# Patient Record
Sex: Female | Born: 1955
Health system: Southern US, Community
[De-identification: ages and names within clinical notes are randomized; demographics above are authoritative.]

## PROBLEM LIST (undated history)

## (undated) DIAGNOSIS — E785 Hyperlipidemia, unspecified: Secondary | ICD-10-CM

## (undated) DIAGNOSIS — J301 Allergic rhinitis due to pollen: Secondary | ICD-10-CM

## (undated) DIAGNOSIS — E039 Hypothyroidism, unspecified: Secondary | ICD-10-CM

## (undated) DIAGNOSIS — D573 Sickle-cell trait: Secondary | ICD-10-CM

## (undated) DIAGNOSIS — N200 Calculus of kidney: Secondary | ICD-10-CM

## (undated) DIAGNOSIS — E041 Nontoxic single thyroid nodule: Secondary | ICD-10-CM

## (undated) DIAGNOSIS — Z5189 Encounter for other specified aftercare: Secondary | ICD-10-CM

## (undated) DIAGNOSIS — C439 Malignant melanoma of skin, unspecified: Secondary | ICD-10-CM

## (undated) DIAGNOSIS — D571 Sickle-cell disease without crisis: Secondary | ICD-10-CM

## (undated) DIAGNOSIS — T7840XA Allergy, unspecified, initial encounter: Secondary | ICD-10-CM

## (undated) DIAGNOSIS — K52832 Lymphocytic colitis: Secondary | ICD-10-CM

## (undated) DIAGNOSIS — K219 Gastro-esophageal reflux disease without esophagitis: Secondary | ICD-10-CM

## (undated) HISTORY — DX: Hypothyroidism, unspecified: E03.9

## (undated) HISTORY — PX: UPPER GASTROINTESTINAL ENDOSCOPY: SHX188

## (undated) HISTORY — DX: Hyperlipidemia, unspecified: E78.5

## (undated) HISTORY — DX: Malignant melanoma of skin, unspecified: C43.9

## (undated) HISTORY — PX: DILATION AND CURETTAGE OF UTERUS: SHX78

## (undated) HISTORY — DX: Gastro-esophageal reflux disease without esophagitis: K21.9

## (undated) HISTORY — DX: Allergic rhinitis due to pollen: J30.1

## (undated) HISTORY — DX: Sickle-cell trait: D57.3

## (undated) HISTORY — PX: TONSILLECTOMY: SHX5217

## (undated) HISTORY — DX: Allergy, unspecified, initial encounter: T78.40XA

## (undated) HISTORY — DX: Encounter for other specified aftercare: Z51.89

## (undated) HISTORY — PX: BREAST BIOPSY: SHX20

## (undated) HISTORY — DX: Nontoxic single thyroid nodule: E04.1

## (undated) HISTORY — DX: Calculus of kidney: N20.0

## (undated) HISTORY — PX: OTHER SURGICAL HISTORY: SHX169

## (undated) HISTORY — PX: NASAL SINUS SURGERY: SHX719

## (undated) HISTORY — DX: Lymphocytic colitis: K52.832

## (undated) HISTORY — DX: Sickle-cell disease without crisis: D57.1

---

## 1998-01-18 ENCOUNTER — Ambulatory Visit (HOSPITAL_BASED_OUTPATIENT_CLINIC_OR_DEPARTMENT_OTHER): Admission: RE | Admit: 1998-01-18 | Discharge: 1998-01-18 | Payer: Self-pay | Admitting: Orthopedic Surgery

## 1998-01-24 ENCOUNTER — Encounter: Admission: RE | Admit: 1998-01-24 | Discharge: 1998-04-24 | Payer: Self-pay | Admitting: Orthopedic Surgery

## 1998-12-21 ENCOUNTER — Other Ambulatory Visit: Admission: RE | Admit: 1998-12-21 | Discharge: 1998-12-21 | Payer: Self-pay | Admitting: Gynecology

## 1999-04-16 ENCOUNTER — Ambulatory Visit (HOSPITAL_COMMUNITY): Admission: RE | Admit: 1999-04-16 | Discharge: 1999-04-16 | Payer: Self-pay | Admitting: Otolaryngology

## 1999-04-16 ENCOUNTER — Encounter: Payer: Self-pay | Admitting: Otolaryngology

## 1999-07-11 ENCOUNTER — Other Ambulatory Visit: Admission: RE | Admit: 1999-07-11 | Discharge: 1999-07-11 | Payer: Self-pay | Admitting: Gynecology

## 1999-11-16 ENCOUNTER — Other Ambulatory Visit: Admission: RE | Admit: 1999-11-16 | Discharge: 1999-11-16 | Payer: Self-pay | Admitting: Gynecology

## 1999-12-16 ENCOUNTER — Other Ambulatory Visit: Admission: RE | Admit: 1999-12-16 | Discharge: 1999-12-16 | Payer: Self-pay | Admitting: Gynecology

## 1999-12-16 ENCOUNTER — Encounter (INDEPENDENT_AMBULATORY_CARE_PROVIDER_SITE_OTHER): Payer: Self-pay

## 2000-01-30 ENCOUNTER — Ambulatory Visit: Admission: RE | Admit: 2000-01-30 | Discharge: 2000-01-30 | Payer: Self-pay | Admitting: Gynecology

## 2000-04-03 ENCOUNTER — Encounter: Payer: Self-pay | Admitting: Gynecology

## 2000-04-03 ENCOUNTER — Encounter: Admission: RE | Admit: 2000-04-03 | Discharge: 2000-04-03 | Payer: Self-pay | Admitting: Gynecology

## 2000-05-09 ENCOUNTER — Other Ambulatory Visit: Admission: RE | Admit: 2000-05-09 | Discharge: 2000-05-09 | Payer: Self-pay | Admitting: Gynecology

## 2000-05-27 ENCOUNTER — Other Ambulatory Visit: Admission: RE | Admit: 2000-05-27 | Discharge: 2000-05-27 | Payer: Self-pay | Admitting: Gynecology

## 2000-05-27 ENCOUNTER — Encounter (INDEPENDENT_AMBULATORY_CARE_PROVIDER_SITE_OTHER): Payer: Self-pay | Admitting: Specialist

## 2000-11-13 ENCOUNTER — Other Ambulatory Visit: Admission: RE | Admit: 2000-11-13 | Discharge: 2000-11-13 | Payer: Self-pay | Admitting: Gynecology

## 2000-11-13 ENCOUNTER — Encounter (INDEPENDENT_AMBULATORY_CARE_PROVIDER_SITE_OTHER): Payer: Self-pay

## 2000-12-19 ENCOUNTER — Other Ambulatory Visit: Admission: RE | Admit: 2000-12-19 | Discharge: 2000-12-19 | Payer: Self-pay | Admitting: Gynecology

## 2000-12-19 ENCOUNTER — Encounter (INDEPENDENT_AMBULATORY_CARE_PROVIDER_SITE_OTHER): Payer: Self-pay

## 2001-04-10 ENCOUNTER — Encounter: Admission: RE | Admit: 2001-04-10 | Discharge: 2001-04-10 | Payer: Self-pay | Admitting: Gynecology

## 2001-04-10 ENCOUNTER — Encounter: Payer: Self-pay | Admitting: Gynecology

## 2001-05-11 ENCOUNTER — Other Ambulatory Visit: Admission: RE | Admit: 2001-05-11 | Discharge: 2001-05-11 | Payer: Self-pay | Admitting: Gynecology

## 2001-12-10 ENCOUNTER — Other Ambulatory Visit: Admission: RE | Admit: 2001-12-10 | Discharge: 2001-12-10 | Payer: Self-pay | Admitting: Gynecology

## 2002-04-13 ENCOUNTER — Encounter: Admission: RE | Admit: 2002-04-13 | Discharge: 2002-04-13 | Payer: Self-pay | Admitting: Gynecology

## 2002-04-13 ENCOUNTER — Encounter: Payer: Self-pay | Admitting: Gynecology

## 2002-06-01 ENCOUNTER — Other Ambulatory Visit: Admission: RE | Admit: 2002-06-01 | Discharge: 2002-06-01 | Payer: Self-pay | Admitting: Gynecology

## 2003-03-10 ENCOUNTER — Encounter: Payer: Self-pay | Admitting: Gynecology

## 2003-03-10 ENCOUNTER — Encounter: Admission: RE | Admit: 2003-03-10 | Discharge: 2003-03-10 | Payer: Self-pay | Admitting: Gynecology

## 2003-06-06 ENCOUNTER — Other Ambulatory Visit: Admission: RE | Admit: 2003-06-06 | Discharge: 2003-06-06 | Payer: Self-pay | Admitting: Gynecology

## 2003-11-04 ENCOUNTER — Ambulatory Visit (HOSPITAL_COMMUNITY): Admission: RE | Admit: 2003-11-04 | Discharge: 2003-11-04 | Payer: Self-pay | Admitting: Gastroenterology

## 2003-11-04 ENCOUNTER — Encounter: Payer: Self-pay | Admitting: Internal Medicine

## 2004-03-22 ENCOUNTER — Encounter: Admission: RE | Admit: 2004-03-22 | Discharge: 2004-03-22 | Payer: Self-pay | Admitting: Gynecology

## 2004-06-07 ENCOUNTER — Other Ambulatory Visit: Admission: RE | Admit: 2004-06-07 | Discharge: 2004-06-07 | Payer: Self-pay | Admitting: Gynecology

## 2004-06-14 ENCOUNTER — Encounter: Payer: Self-pay | Admitting: Internal Medicine

## 2004-12-26 ENCOUNTER — Encounter: Payer: Self-pay | Admitting: Internal Medicine

## 2004-12-31 ENCOUNTER — Encounter: Admission: RE | Admit: 2004-12-31 | Discharge: 2004-12-31 | Payer: Self-pay | Admitting: Internal Medicine

## 2005-01-03 ENCOUNTER — Encounter: Payer: Self-pay | Admitting: Internal Medicine

## 2005-01-11 ENCOUNTER — Encounter: Admission: RE | Admit: 2005-01-11 | Discharge: 2005-01-11 | Payer: Self-pay | Admitting: Internal Medicine

## 2005-04-02 HISTORY — PX: CHOLECYSTECTOMY: SHX55

## 2005-04-23 ENCOUNTER — Encounter (INDEPENDENT_AMBULATORY_CARE_PROVIDER_SITE_OTHER): Payer: Self-pay | Admitting: *Deleted

## 2005-04-23 ENCOUNTER — Ambulatory Visit (HOSPITAL_COMMUNITY): Admission: RE | Admit: 2005-04-23 | Discharge: 2005-04-23 | Payer: Self-pay | Admitting: Surgery

## 2005-05-14 ENCOUNTER — Encounter: Admission: RE | Admit: 2005-05-14 | Discharge: 2005-05-14 | Payer: Self-pay | Admitting: Gynecology

## 2005-06-18 ENCOUNTER — Encounter: Admission: RE | Admit: 2005-06-18 | Discharge: 2005-06-18 | Payer: Self-pay | Admitting: Internal Medicine

## 2005-07-23 ENCOUNTER — Other Ambulatory Visit: Admission: RE | Admit: 2005-07-23 | Discharge: 2005-07-23 | Payer: Self-pay | Admitting: Gynecology

## 2006-02-05 ENCOUNTER — Encounter: Admission: RE | Admit: 2006-02-05 | Discharge: 2006-02-05 | Payer: Self-pay | Admitting: Internal Medicine

## 2006-03-07 ENCOUNTER — Encounter: Admission: RE | Admit: 2006-03-07 | Discharge: 2006-03-07 | Payer: Self-pay | Admitting: Internal Medicine

## 2006-03-07 ENCOUNTER — Encounter: Payer: Self-pay | Admitting: Internal Medicine

## 2006-05-11 ENCOUNTER — Emergency Department (HOSPITAL_COMMUNITY): Admission: EM | Admit: 2006-05-11 | Discharge: 2006-05-11 | Payer: Self-pay | Admitting: Family Medicine

## 2006-05-19 ENCOUNTER — Other Ambulatory Visit: Admission: RE | Admit: 2006-05-19 | Discharge: 2006-05-19 | Payer: Self-pay | Admitting: Gynecology

## 2006-06-06 ENCOUNTER — Encounter: Admission: RE | Admit: 2006-06-06 | Discharge: 2006-06-06 | Payer: Self-pay | Admitting: Internal Medicine

## 2006-12-15 ENCOUNTER — Other Ambulatory Visit: Admission: RE | Admit: 2006-12-15 | Discharge: 2006-12-15 | Payer: Self-pay | Admitting: Gynecology

## 2006-12-31 ENCOUNTER — Encounter: Admission: RE | Admit: 2006-12-31 | Discharge: 2007-01-14 | Payer: Self-pay | Admitting: Orthopedic Surgery

## 2007-01-16 ENCOUNTER — Encounter: Payer: Self-pay | Admitting: Internal Medicine

## 2007-01-20 ENCOUNTER — Encounter: Payer: Self-pay | Admitting: Internal Medicine

## 2007-01-29 ENCOUNTER — Emergency Department (HOSPITAL_COMMUNITY): Admission: EM | Admit: 2007-01-29 | Discharge: 2007-01-29 | Payer: Self-pay | Admitting: Family Medicine

## 2007-01-30 ENCOUNTER — Encounter: Payer: Self-pay | Admitting: Internal Medicine

## 2007-02-06 ENCOUNTER — Encounter: Payer: Self-pay | Admitting: Internal Medicine

## 2007-02-06 ENCOUNTER — Encounter: Admission: RE | Admit: 2007-02-06 | Discharge: 2007-02-06 | Payer: Self-pay | Admitting: Internal Medicine

## 2007-05-27 ENCOUNTER — Encounter: Payer: Self-pay | Admitting: Internal Medicine

## 2007-06-11 ENCOUNTER — Encounter: Admission: RE | Admit: 2007-06-11 | Discharge: 2007-06-11 | Payer: Self-pay | Admitting: Internal Medicine

## 2007-08-03 ENCOUNTER — Other Ambulatory Visit: Admission: RE | Admit: 2007-08-03 | Discharge: 2007-08-03 | Payer: Self-pay | Admitting: Gynecology

## 2008-02-22 ENCOUNTER — Other Ambulatory Visit: Admission: RE | Admit: 2008-02-22 | Discharge: 2008-02-22 | Payer: Self-pay | Admitting: Gynecology

## 2008-05-04 ENCOUNTER — Encounter: Payer: Self-pay | Admitting: Internal Medicine

## 2008-06-13 ENCOUNTER — Encounter: Admission: RE | Admit: 2008-06-13 | Discharge: 2008-06-13 | Payer: Self-pay | Admitting: Gynecology

## 2008-09-02 DIAGNOSIS — C439 Malignant melanoma of skin, unspecified: Secondary | ICD-10-CM

## 2008-09-02 HISTORY — PX: OTHER SURGICAL HISTORY: SHX169

## 2008-09-02 HISTORY — DX: Malignant melanoma of skin, unspecified: C43.9

## 2008-12-21 ENCOUNTER — Ambulatory Visit: Payer: Self-pay | Admitting: Internal Medicine

## 2008-12-21 DIAGNOSIS — D573 Sickle-cell trait: Secondary | ICD-10-CM | POA: Insufficient documentation

## 2008-12-21 DIAGNOSIS — Z87448 Personal history of other diseases of urinary system: Secondary | ICD-10-CM | POA: Insufficient documentation

## 2008-12-21 DIAGNOSIS — J301 Allergic rhinitis due to pollen: Secondary | ICD-10-CM

## 2008-12-21 DIAGNOSIS — J45909 Unspecified asthma, uncomplicated: Secondary | ICD-10-CM | POA: Insufficient documentation

## 2008-12-21 DIAGNOSIS — E039 Hypothyroidism, unspecified: Secondary | ICD-10-CM | POA: Insufficient documentation

## 2008-12-21 DIAGNOSIS — E041 Nontoxic single thyroid nodule: Secondary | ICD-10-CM | POA: Insufficient documentation

## 2008-12-21 DIAGNOSIS — J309 Allergic rhinitis, unspecified: Secondary | ICD-10-CM | POA: Insufficient documentation

## 2008-12-22 ENCOUNTER — Ambulatory Visit: Payer: Self-pay | Admitting: Internal Medicine

## 2008-12-23 ENCOUNTER — Telehealth: Payer: Self-pay | Admitting: Internal Medicine

## 2008-12-26 LAB — CONVERTED CEMR LAB
Bilirubin Urine: NEGATIVE
CO2: 26 meq/L (ref 19–32)
Calcium: 9.4 mg/dL (ref 8.4–10.5)
Cholesterol: 197 mg/dL (ref 0–200)
Creatinine, Ser: 0.6 mg/dL (ref 0.4–1.2)
Eosinophils Relative: 4.1 % (ref 0.0–5.0)
Glucose, Bld: 103 mg/dL — ABNORMAL HIGH (ref 70–99)
HCT: 41.9 % (ref 36.0–46.0)
HDL: 45.3 mg/dL (ref >39.00)
Hemoglobin: 14 g/dL (ref 12.0–15.0)
Leukocytes, UA: NEGATIVE
Lymphocytes Relative: 23.5 % (ref 12.0–46.0)
MCHC: 33.5 g/dL (ref 30.0–36.0)
Neutro Abs: 3.3 10*3/uL (ref 1.4–7.7)
Neutrophils Relative %: 62.9 % (ref 43.0–77.0)
Nitrite: NEGATIVE
Potassium: 4 meq/L (ref 3.5–5.1)
RDW: 12.1 % (ref 11.5–14.6)
TSH: 1.36 microintl units/mL (ref 0.35–5.50)
Total CHOL/HDL Ratio: 4
Urine Glucose: NEGATIVE mg/dL
Urobilinogen, UA: 0.2 (ref 0.0–1.0)
WBC: 5.1 10*3/uL (ref 4.5–10.5)
pH: 5.5 (ref 5.0–8.0)

## 2008-12-28 ENCOUNTER — Telehealth: Payer: Self-pay | Admitting: Internal Medicine

## 2009-06-09 ENCOUNTER — Ambulatory Visit: Payer: Self-pay | Admitting: Internal Medicine

## 2009-06-09 DIAGNOSIS — R141 Gas pain: Secondary | ICD-10-CM

## 2009-06-09 DIAGNOSIS — E785 Hyperlipidemia, unspecified: Secondary | ICD-10-CM | POA: Insufficient documentation

## 2009-06-09 DIAGNOSIS — R143 Flatulence: Secondary | ICD-10-CM

## 2009-06-09 DIAGNOSIS — R142 Eructation: Secondary | ICD-10-CM

## 2009-06-19 ENCOUNTER — Encounter: Admission: RE | Admit: 2009-06-19 | Discharge: 2009-06-19 | Payer: Self-pay | Admitting: Gynecology

## 2009-08-01 ENCOUNTER — Telehealth: Payer: Self-pay | Admitting: Internal Medicine

## 2009-08-01 ENCOUNTER — Ambulatory Visit: Payer: Self-pay | Admitting: Internal Medicine

## 2009-08-01 LAB — CONVERTED CEMR LAB
Direct LDL: 154.4 mg/dL
HDL: 47.1 mg/dL (ref >39.00)
Total CHOL/HDL Ratio: 5
VLDL: 16 mg/dL (ref 0.0–40.0)

## 2009-08-30 ENCOUNTER — Ambulatory Visit: Payer: Self-pay | Admitting: Internal Medicine

## 2009-11-20 ENCOUNTER — Ambulatory Visit: Payer: Self-pay | Admitting: Internal Medicine

## 2009-11-20 LAB — CONVERTED CEMR LAB
AST: 15 units/L (ref 0–37)
Basophils Relative: 1 % (ref 0.0–3.0)
Bilirubin Urine: NEGATIVE
Bilirubin, Direct: 0.2 mg/dL (ref 0.0–0.3)
Cholesterol: 113 mg/dL (ref 0–200)
Creatinine, Ser: 0.6 mg/dL (ref 0.4–1.2)
Eosinophils Relative: 3.1 % (ref 0.0–5.0)
GFR calc non Af Amer: 109.86 mL/min (ref >60)
HDL: 46.3 mg/dL (ref >39.00)
Hemoglobin: 13.8 g/dL (ref 12.0–15.0)
Lymphs Abs: 1.2 10*3/uL (ref 0.7–4.0)
MCHC: 33.4 g/dL (ref 30.0–36.0)
MCV: 88.4 fL (ref 78.0–100.0)
Monocytes Relative: 8.3 % (ref 3.0–12.0)
Neutro Abs: 3.5 10*3/uL (ref 1.4–7.7)
Potassium: 4.1 meq/L (ref 3.5–5.1)
Sodium: 142 meq/L (ref 135–145)
Specific Gravity, Urine: 1.015 (ref 1.000–1.030)
TSH: 0.52 microintl units/mL (ref 0.35–5.50)
Total CHOL/HDL Ratio: 2
Total Protein: 6.7 g/dL (ref 6.0–8.3)
Triglycerides: 64 mg/dL (ref 0.0–149.0)
Urine Glucose: NEGATIVE mg/dL
Urobilinogen, UA: 0.2 (ref 0.0–1.0)
VLDL: 12.8 mg/dL (ref 0.0–40.0)
WBC: 5.5 10*3/uL (ref 4.5–10.5)
pH: 5 (ref 5.0–8.0)

## 2009-11-22 ENCOUNTER — Ambulatory Visit: Payer: Self-pay | Admitting: Internal Medicine

## 2009-12-11 ENCOUNTER — Ambulatory Visit: Payer: Self-pay | Admitting: Internal Medicine

## 2009-12-11 DIAGNOSIS — IMO0001 Reserved for inherently not codable concepts without codable children: Secondary | ICD-10-CM

## 2009-12-11 LAB — CONVERTED CEMR LAB: Total CK: 77 units/L (ref 7–177)

## 2010-05-15 ENCOUNTER — Encounter (INDEPENDENT_AMBULATORY_CARE_PROVIDER_SITE_OTHER): Payer: Self-pay | Admitting: *Deleted

## 2010-05-15 ENCOUNTER — Ambulatory Visit: Payer: Self-pay | Admitting: Internal Medicine

## 2010-05-15 DIAGNOSIS — K219 Gastro-esophageal reflux disease without esophagitis: Secondary | ICD-10-CM

## 2010-05-15 DIAGNOSIS — R0602 Shortness of breath: Secondary | ICD-10-CM

## 2010-05-18 ENCOUNTER — Ambulatory Visit: Payer: Self-pay | Admitting: Internal Medicine

## 2010-05-19 LAB — CONVERTED CEMR LAB
Cholesterol: 193 mg/dL (ref 0–200)
HDL: 52.1 mg/dL (ref >39.00)
LDL Cholesterol: 129 mg/dL — ABNORMAL HIGH (ref 0–99)
Total CHOL/HDL Ratio: 4

## 2010-06-08 ENCOUNTER — Telehealth: Payer: Self-pay | Admitting: Internal Medicine

## 2010-06-27 ENCOUNTER — Ambulatory Visit: Payer: Self-pay | Admitting: Gastroenterology

## 2010-06-27 ENCOUNTER — Ambulatory Visit: Payer: Self-pay | Admitting: Internal Medicine

## 2010-06-27 DIAGNOSIS — J019 Acute sinusitis, unspecified: Secondary | ICD-10-CM | POA: Insufficient documentation

## 2010-07-12 ENCOUNTER — Ambulatory Visit: Payer: Self-pay | Admitting: Gastroenterology

## 2010-07-12 HISTORY — PX: ESOPHAGOGASTRODUODENOSCOPY: SHX1529

## 2010-08-07 ENCOUNTER — Telehealth: Payer: Self-pay | Admitting: Internal Medicine

## 2010-09-22 ENCOUNTER — Encounter: Payer: Self-pay | Admitting: Internal Medicine

## 2010-09-23 ENCOUNTER — Encounter: Payer: Self-pay | Admitting: Gynecology

## 2010-09-30 LAB — CONVERTED CEMR LAB: Pap Smear: NEGATIVE

## 2010-10-02 ENCOUNTER — Ambulatory Visit
Admission: RE | Admit: 2010-10-02 | Discharge: 2010-10-02 | Payer: Self-pay | Source: Home / Self Care | Attending: Internal Medicine | Admitting: Internal Medicine

## 2010-10-04 NOTE — Letter (Signed)
Summary: EGD Instructions  [facility]  [address] [place], [place] [address]   Phone: [phone]  Fax: [phone]       Lindsey Ferrell    04/06/1956    MRN: [id number]       Procedure Day /Date:THURSDAY 07/12/2010     Arrival Time: 1PM     Procedure Time:2PM     Location of Procedure:                    X  [facility] (4th Floor)  PREPARATION FOR ENDOSCOPY   On11/10 THE DAY OF THE PROCEDURE:  1.   No solid foods, milk or milk products are allowed after midnight the night before your procedure.  2.   Do not drink anything colored red or purple.  Avoid juices with pulp.  No orange juice.  3.  You may drink clear liquids until12PM, which is 2 hours before your procedure.                                                                                                CLEAR LIQUIDS INCLUDE: Water Jello Ice Popsicles Tea (sugar ok, no milk/cream) Powdered fruit flavored drinks Coffee (sugar ok, no milk/cream) Gatorade Juice: apple, white grape, white cranberry  Lemonade Clear bullion, consomm, broth Carbonated beverages (any kind) Strained chicken noodle soup Hard Candy   MEDICATION INSTRUCTIONS  Unless otherwise instructed, you should take regular prescription medications with a small sip of water as early as possible the morning of your procedure.            OTHER INSTRUCTIONS  You will need a responsible adult at least 57 years of age to accompany you and drive you home.   This person must remain in the waiting room during your procedure.  Wear loose fitting clothing that is easily removed.  Leave jewelry and other valuables at home.  However, you may wish to bring a book to read or an iPod/MP3 player to listen to music as you wait for your procedure to start.  Remove all body piercing jewelry and leave at home.  Total time from sign-in until discharge is approximately 2-3 hours.  You should go home directly after your  procedure and rest.  You can resume normal activities the day after your procedure.  The day of your procedure you should not:   Drive   Make legal decisions   Operate machinery   Drink alcohol   Return to work  You will receive specific instructions about eating, activities and medications before you leave.    The above instructions have been reviewed and explained to me by   _______________________    I fully understand and can verbalize these instructions _____________________________ Date _________

## 2010-10-04 NOTE — Procedures (Signed)
Summary: Upper Endoscopy  Patient: Lindsey Ferrell Note: All result statuses are Final unless otherwise noted.  Tests: (1) Upper Endoscopy (EGD)   EGD Upper Endoscopy       DONE     [facility]     [address]     [place], [place]  [address]           ENDOSCOPY PROCEDURE REPORT           PATIENT:  Lindsey Ferrell, Lindsey Ferrell  MR#:  [id number]     BIRTHDATE:  1956/04/14, 54 yrs. old  GENDER:  female           ENDOSCOPIST:  Barbette Hair. Arlyce Dice, MD     Referred by:  Rene Paci, M.D.           PROCEDURE DATE:  07/12/2010     PROCEDURE:  EGD, diagnostic 43235     ASA CLASS:  Class I     INDICATIONS:  reflux symptoms despite therapy           MEDICATIONS:   Fentanyl 50 mcg IV, Versed 5 mg IV, glycopyrrolate     (Robinal) 0.2 mg IV, 0.6cc simethancone 0.6 cc PO     TOPICAL ANESTHETIC:  Exactacain Spray           DESCRIPTION OF PROCEDURE:   After the risks benefits and     alternatives of the procedure were thoroughly explained, informed     consent was obtained.  The LB GIF-H180 [id number] endoscope was     introduced through the mouth and advanced to the third portion of     the duodenum, without limitations.  The instrument was slowly     withdrawn as the mucosa was fully examined.     <<PROCEDUREIMAGES>>           The upper, middle, and distal third of the esophagus were     carefully inspected and no abnormalities were noted. The z-line     was well seen at the GEJ. The endoscope was pushed into the fundus     which was normal including a retroflexed view. The antrum,gastric     body, first and second part of the duodenum were unremarkable (see     image1 and image2).    Retroflexed views revealed no     abnormalities.    The scope was then withdrawn from the patient     and the procedure completed.           COMPLICATIONS:  None           ENDOSCOPIC IMPRESSION:     1) Normal EGD     RECOMMENDATIONS:     1) continue PPI - zegerid     2) Call office next 2-3 days to  schedule an office appointment     for           REPEAT EXAM:  No           ______________________________     Barbette Hair. Arlyce Dice, MD           CC:           n.     eSIGNED:   Barbette Hair. Kaplan at 07/12/2010 02:29 PM           Molinda Bailiff, [phone]  Note: An exclamation mark (!) indicates a result that was not dispersed into the flowsheet. Document Creation Date: 07/12/2010 2:29 PM _______________________________________________________________________  (1) Order result status: Final Collection or  observation date-time: 07/12/2010 14:14 Requested date-time:  Receipt date-time:  Reported date-time:  Referring Physician:   Ordering Physician: Melvia Heaps [phone]) Specimen Source:  Source: Launa Grill Order Number: [id number] Lab site:

## 2010-10-04 NOTE — Assessment & Plan Note (Signed)
Summary: cpx / #/ cd   Vital Signs:  Patient profile:   55 year old female Height:      65 inches (165.10 cm) Weight:      151.6 pounds (68.91 kg) BMI:     25.32 O2 Sat:      97 % on Room air Temp:     97.1 degrees F (36.17 degrees C) oral Pulse rate:   65 / minute BP sitting:   98 / 72  (left arm) Cuff size:   regular  Vitals Entered By: Orlan Leavens (November 22, 2009 9:52 AM)  O2 Flow:  Room air CC: CPX Is Patient Diabetic? No Pain Assessment Patient in pain? no        Primary Care Provider:  Newt Lukes MD  CC:  CPX.  History of Present Illness: patient is here today for annual physical. Patient feels well and has no complaints.    Preventive Screening-Counseling & Management  Alcohol-Tobacco     Alcohol drinks/day: <1     Alcohol Counseling: not indicated; use of alcohol is not excessive or problematic     Smoking Status: never     Tobacco Counseling: not indicated; no tobacco use  Caffeine-Diet-Exercise     Does Patient Exercise: yes     Type of exercise: walk     Exercise (avg: min/session): <30     Times/week: 7     Exercise Counseling: to improve exercise regimen  Safety-Violence-Falls     Seat Belt Use: yes     Helmet Use: n/a     Firearms in the Home: no firearms in the home     Smoke Detectors: yes     Violence in the Home: no risk noted     Fall Risk Counseling: not indicated; no significant falls noted  Clinical Review Panels:  Prevention   Last Mammogram:  No specific mammographic evidence of malignancy.   (04/02/2009)   Last Pap Smear:  Interpretation/Result:Negative for intraepithelial Lesion or Malignancy.    (12/01/2008)   Last Colonoscopy:  normal (11/04/2003)  Immunizations   Last Flu Vaccine:  Fluvax 3+ (06/09/2009)  Lipid Management   Cholesterol:  113 (11/20/2009)   LDL (bad choesterol):  54 (11/20/2009)   HDL (good cholesterol):  46.30 (11/20/2009)  CBC   WBC:  5.5 (11/20/2009)   RBC:  4.66 (11/20/2009)   Hgb:   13.8 (11/20/2009)   Hct:  41.2 (11/20/2009)   Platelets:  178.0 (11/20/2009)   MCV  88.4 (11/20/2009)   MCHC  33.4 (11/20/2009)   RDW  12.3 (11/20/2009)   PMN:  65.3 (11/20/2009)   Lymphs:  22.3 (11/20/2009)   Monos:  8.3 (11/20/2009)   Eosinophils:  3.1 (11/20/2009)   Basophil:  1.0 (11/20/2009)  Complete Metabolic Panel   Glucose:  91 (11/20/2009)   Sodium:  142 (11/20/2009)   Potassium:  4.1 (11/20/2009)   Chloride:  109 (11/20/2009)   CO2:  30 (11/20/2009)   BUN:  15 (11/20/2009)   Creatinine:  0.6 (11/20/2009)   Albumin:  3.9 (11/20/2009)   Total Protein:  6.7 (11/20/2009)   Calcium:  9.5 (11/20/2009)   Total Bili:  0.9 (11/20/2009)   Alk Phos:  77 (11/20/2009)   SGPT (ALT):  18 (11/20/2009)   SGOT (AST):  15 (11/20/2009)   Current Medications (verified): 1)  Levothyroxine Sodium 137 Mcg Tabs (Levothyroxine Sodium) .... Take 1 By Mouth Qd 2)  Aspir-Low 81 Mg Tbec (Aspirin) .... Take 1 By Mouth Qd 3)  Crestor 5  Mg Tabs (Rosuvastatin Calcium) .... Take 1 At Bedtime 4)  Vitamin D 1000 Unit Tabs (Cholecalciferol) .... Once Daily  Allergies (verified): No Known Drug Allergies  Past History:  Past medical, surgical, family and social histories (including risk factors) reviewed, and no changes noted (except as noted below).  Past Medical History: Allergic rhinitis Asthma hypothyroidism dyslipidemia  melanoma, right thigh  MD rooster: gyn - [facility] - [place], [place]  Past Surgical History: Cholecystectomy (2007) Tonsillectomy (child0 Sinus surgery (1999) melanoma in situ excision right ant thigh -(2010)  Family History: Reviewed history from 06/09/2009 and no changes required. Family History of Arthritis (parents) Family History Diabetes 1st degree relative (Maternal uncle) Family History Hypertension (parents & grandparents) Family History of Stroke F 1st degree relative <60 (parents & grandparents) Heart disease (parents & grandparents) Sickle  trait  father is 17 - sudden MI summer 2010 Mother died of either stroke or heart attack at age 57 sister dx breast cancer 76yo  Social History: Reviewed history from 12/21/2008 and no changes required. Never Smoked married occasional alcohol 2 grown daughters with grandchildren + 2 adopted children, living at home.  Seat Belt Use:  yes  Review of Systems       see HPI above. I have reviewed all other systems and they were negative.   Physical Exam  General:  alert, well-developed, well-nourished, and cooperative to examination.   Head:  Normocephalic and atraumatic without obvious abnormalities. No apparent alopecia or balding. Eyes:  vision grossly intact; pupils equal, round and reactive to light.  conjunctiva and lids normal.    Ears:  normal pinnae bilaterally, without erythema, swelling, or tenderness to palpation. TMs hazy but no effusion, no cerumen impaction. Hearing grossly normal bilaterally  Mouth:  teeth and gums in good repair; mucous membranes moist, without lesions or ulcers. oropharynx clear without exudate, no erythema.  Neck:  supple, full ROM, no masses, no thyromegaly; no thyroid nodules or tenderness. no JVD or carotid bruits.   Lungs:  normal respiratory effort, no intercostal retractions or use of accessory muscles; normal breath sounds bilaterally - no crackles and no wheezes.    Heart:  normal rate, regular rhythm, no murmur, and no rub. BLE without edema. normal DP pulses and normal cap refill in all 4 extremities    Abdomen:  soft, non-tender, normal bowel sounds, no distention; no masses and no appreciable hepatomegaly or splenomegaly.   Msk:  No deformity or scoliosis noted of thoracic or lumbar spine.   Neurologic:  alert & oriented X3 and cranial nerves II-XII symetrically intact.  strength normal in all extremities, sensation intact to light touch, and gait normal. speech fluent without dysarthria or aphasia; follows commands with good comprehension.    Skin:  no rashes, vesicles, ulcers, or erythema. No nodules or irregularity to palpation.  well healed wide excsion scar on right anterior thigh Psych:  Oriented X3, memory intact for recent and remote, normally interactive, good eye contact, not anxious appearing, not depressed appearing, and not agitated.      Impression & Recommendations:  Problem # 1:  PHYSICAL EXAMINATION (ICD-V70.0) Patient has been counseled on age-appropriate routine health concerns for screening and prevention. These are reviewed and up-to-date. Immunizations are up-to-date or declined. Labs and ECG reviewed.  Orders: EKG w/ Interpretation (93000)  Complete Medication List: 1)  Levothyroxine Sodium 137 Mcg Tabs (Levothyroxine sodium) .... Take 1 by mouth qd 2)  Aspir-low 81 Mg Tbec (Aspirin) .... Take 1 by mouth qd 3)  Crestor  5 Mg Tabs (Rosuvastatin calcium) .... Take 1 at bedtime 4)  Vitamin D 1000 Unit Tabs (Cholecalciferol) .... Once daily  Patient Instructions: 1)  it was good to see you today.  2)  labs and exam look great! keep up the good work -  3)  refills on medications as discussed - no changes 4)  try Aleve, Advil or Tylenol (and/or massage) for your low back symptoms - if this is not helping or if pain gets worse, let us know for further evaluation as needed  5)  followup as ongoing with dermatology and gynecology 6)  Please schedule a follow-up appointment in 6 months for review of meds and labs (cholesterol, liver & thryroid), call sooner if problems.  Prescriptions: CRESTOR 5 MG TABS (ROSUVASTATIN CALCIUM) take 1 at bedtime  #30 x 11   Entered by:   Orlan Leavens   Authorized by:   Newt Lukes MD   Signed by:   Orlan Leavens on 11/22/2009   Method used:   Electronically to        [facility] [address] [phone]* (retail)       [address]       [place], [place]  [address]       Ph: [phone] or [phone]       Fax: [phone]   RxID:    [id number] LEVOTHYROXINE SODIUM 137 MCG TABS (LEVOTHYROXINE SODIUM) take 1 by mouth qd  #30 x 11   Entered by:   Orlan Leavens   Authorized by:   Newt Lukes MD   Signed by:   Orlan Leavens on 11/22/2009   Method used:   Electronically to        [facility] [address] [phone]* (retail)       [address]       [place], [place]  [address]       Ph: [phone] or [phone]       Fax: [phone]   RxID:   [id number]    Pap Smear  Procedure date:  12/01/2008  Findings:      Interpretation/Result:Negative for intraepithelial Lesion or Malignancy.     Mammogram  Procedure date:  04/02/2009  Findings:      No specific mammographic evidence of malignancy.

## 2010-10-04 NOTE — Assessment & Plan Note (Signed)
Summary: ?sinus inf/cd   Vital Signs:  Patient profile:   55 year old female Height:      65 inches (165.10 cm) Weight:      152 pounds (69.09 kg) O2 Sat:      97 % on Room air Temp:     98.1 degrees F (36.72 degrees C) oral Pulse rate:   75 / minute BP sitting:   102 / 82  (left arm) Cuff size:   regular  Vitals Entered By: Orlan Leavens RMA (June 27, 2010 9:52 AM)  O2 Flow:  Room air CC: ? Sinus infection Is Patient Diabetic? No Pain Assessment Patient in pain? no        Primary Care Provider:  Newt Lukes MD  CC:  ? Sinus infection.  History of Present Illness: c/o sinus problems onset 2 weeks ago assoc with left frontal sinus pressure improved but resolved with nasonex and decongestant use low garde fever -  +purulent nasal draiange and productive cough symptoms worse at night (when lying flat) symptoms precipitated by weather change  reviewed chronic med issues dyslipidemia- stopped crestor 12/2009 due to myalgias resumed med tx 06/2010 - doing well w/o mysalgia symptoms  reports prev with nonsp elevation of CPK approx 3 years ago - verified with repeat labs, then normalized  hypothyroid - reports compliance with ongoing medical treatment and no changes in medication dose or frequency. denies adverse side effects related to current therapy. no skin/hair change, +weight gain  ?GERD - see discussion above with dyspnea - no regular NSAID use, no hx ulcers   Clinical Review Panels:  Lipid Management   Cholesterol:  193 (05/18/2010)   LDL (bad choesterol):  129 (05/18/2010)   HDL (good cholesterol):  52.10 (05/18/2010)  CBC   WBC:  5.5 (11/20/2009)   RBC:  4.66 (11/20/2009)   Hgb:  13.8 (11/20/2009)   Hct:  41.2 (11/20/2009)   Platelets:  178.0 (11/20/2009)   MCV  88.4 (11/20/2009)   MCHC  33.4 (11/20/2009)   RDW  12.3 (11/20/2009)   PMN:  65.3 (11/20/2009)   Lymphs:  22.3 (11/20/2009)   Monos:  8.3 (11/20/2009)   Eosinophils:  3.1  (11/20/2009)   Basophil:  1.0 (11/20/2009)   Current Medications (verified): 1)  Levothyroxine Sodium 137 Mcg Tabs (Levothyroxine Sodium) .... Take 1 By Mouth Qd 2)  Vitamin D 1000 Unit Tabs (Cholecalciferol) .... Once Daily As Needed 3)  Crestor 5 Mg Tabs (Rosuvastatin Calcium) .Marland Kitchen.. 1 Tab By Mouth At Bedtime 4)  Zegerid 40-1100 Mg Caps (Omeprazole-Sodium Bicarbonate) .... Take One Tab At That Time  Allergies (verified): No Known Drug Allergies  Past History:  Past Medical History: Allergic rhinitis Asthma hypothyroidism dyslipidemia   melanoma, right thigh GERD  MD roster: gyn - [facility] - [place], [facility] - [facility]  Review of Systems  The patient denies hoarseness, chest pain, headaches, and hemoptysis.    Physical Exam  General:  alert, well-developed, well-nourished, and cooperative to examination.   Eyes:  vision grossly intact; pupils equal, round and reactive to light.  conjunctiva and lids normal.    Ears:  hazy L TM Nose:  mild frontal sinus tenderness to palp on left side Mouth:  teeth and gums in good repair; mucous membranes moist, without lesions or ulcers. oropharynx clear without exudate, min erythema. +PND Lungs:  normal respiratory effort, no intercostal retractions or use of accessory muscles; normal breath sounds bilaterally - no crackles and no wheezes.    Heart:  normal rate, regular rhythm, no murmur, and no rub. BLE without edema.   Impression & Recommendations:  Problem # 1:  SINUSITIS, ACUTE (ICD-461.9)  Her updated medication list for this problem includes:    Nasonex 50 Mcg/act Susp (Mometasone furoate) .Marland Kitchen... 1 spray every nostril once daily    Amoxicillin-pot Clavulanate 875-125 Mg Tabs (Amoxicillin-pot clavulanate) .Marland Kitchen... 1 by mouth two times a day x 7 days  Instructed on treatment. Call if symptoms persist or worsen.   Orders: Prescription Created Electronically [phone])  Problem # 2:  GERD (ICD-530.81)  Her updated medication  list for this problem includes:    Zegerid 40-1100 Mg Caps (Omeprazole-sodium bicarbonate) .Marland Kitchen... Take one tab at that time per GI OV today: Symptoms of chest tightness may certainly be related to GERD.  She appears to have had a partial response to omeprazole active peptic disease is less likely.  H. pylori infection and Barrett's should be ruled out.  She seems to primarily have nocturnal symptoms.  Recommendations #1 switch from omeprazole to Zegerid 40 mg q.h.s. #2 upper endoscopy  Complete Medication List: 1)  Levothyroxine Sodium 137 Mcg Tabs (Levothyroxine sodium) .... Take 1 by mouth qd 2)  Vitamin D 1000 Unit Tabs (Cholecalciferol) .... Once daily as needed 3)  Crestor 5 Mg Tabs (Rosuvastatin calcium) .Marland Kitchen.. 1 tab by mouth at bedtime 4)  Zegerid 40-1100 Mg Caps (Omeprazole-sodium bicarbonate) .... Take one tab at that time 5)  Nasonex 50 Mcg/act Susp (Mometasone furoate) .Marland Kitchen.. 1 spray every nostril once daily 6)  Amoxicillin-pot Clavulanate 875-125 Mg Tabs (Amoxicillin-pot clavulanate) .Marland Kitchen.. 1 by mouth two times a day x 7 days  Patient Instructions: 1)  it was good to see you today. 2)  augmentin antibiotics and nasonex - your prescriptions have been electronically submitted to your pharmacy. Please take as directed. Contact our office if you believe you're having problems with the medication(s).  3)  Get plenty of rest, drink lots of clear liquids, and use Tylenol or Ibuprofen for fever and comfort. Return in 7-10 days if you're not better:sooner if you're feeling worse. Prescriptions: AMOXICILLIN-POT CLAVULANATE 875-125 MG TABS (AMOXICILLIN-POT CLAVULANATE) 1 by mouth two times a day x 7 days  #14 x 0   Entered and Authorized by:   Newt Lukes MD   Signed by:   Newt Lukes MD on 06/27/2010   Method used:   Electronically to        [facility] [address] [phone]* (retail)       [address]       [place], [place]  [address]       Ph: [phone] or  [phone]       Fax: [phone]   RxID:   [id number] NASONEX 50 MCG/ACT SUSP (MOMETASONE FUROATE) 1 spray every nostril once daily  #1 x 3   Entered and Authorized by:   Newt Lukes MD   Signed by:   Newt Lukes MD on 06/27/2010   Method used:   Electronically to        [facility] [address] [phone]* (retail)       [address]       [place], [place]  [address]       Ph: [phone] or [phone]       Fax: [phone]   RxID:   [id number]    Orders Added: 1)  Est. Patient Level IV [[address]] 2)  Prescription Created Electronically [phone]

## 2010-10-04 NOTE — Letter (Signed)
Summary: [facility] Consult Scheduled Letter  [facility]  [address] [place], [place] [address]   Phone: [phone]  Fax: [phone]      05/15/2010 MRN: [id number]  [facility] [address] [place], [place]  [address]    Dear Ms. Empson,      We have scheduled an appointment for you.  At the recommendation of Dr.Leschber, we have scheduled you a consult with Dr Arlyce Dice on 06/27/10 at 8:30am.  Their phone number is [phone].  If this appointment day and time is not convenient for you, please feel free to call the office of the doctor you are being referred to at the number listed above and reschedule the appointment.    [facility] [address] [place], [place] [address] *Gastroenterology Dept.3rd Floor*   Please give 24hr notice if you need to cancel/reschedule to avoid a $50.00 fee.Also bring insurance card and any co-pay due at time of visit.    Thank you,  Patient Care Coordinator [facility]

## 2010-10-04 NOTE — Assessment & Plan Note (Signed)
Summary: gerd...as.   History of Present Illness Visit Type: consult  Primary GI MD: Melvia Heaps MD [facility] Primary Kenyon Eichelberger: Newt Lukes MD Requesting Wildon Cuevas: Newt Lukes MD Chief Complaint: GERD, upper abd pain, tightness in chest and bloating  History of Present Illness:   [name]  is a 55 year old white female referred at the request of Dr.  Felicity Coyer for evaluation of upper abdominal discomfort and chest tightness.  Over the past year she has had the sensation of wanting to take a deep breath.  She denies dyspnea on exertion or resting shortness of breath.  She has also noted some chest tightness, especially lying in the recumbent position.  She denies pyrosis, per se.  Symptoms have improved with omeprazole though they remain to some degree.  She  had a cardiac workup in the past that was negative.  She is on no gastric irritants including nonsteroidals.  She has noted a minor change in bowel habits over the past year characterized by intermittent loose stools alternating with solid bowel movements.  Colonoscopy 2005 for screening was unremarkable.  She has also noted an area of firmness in her epigastrium when she lays down that is minimally tender to palpation.   GI Review of Systems    Reports abdominal pain, acid reflux, bloating, and  heartburn.     Location of  Abdominal pain: upper abdomen.    Denies belching, chest pain, dysphagia with liquids, dysphagia with solids, loss of appetite, nausea, vomiting, vomiting blood, weight loss, and  weight gain.      Reports change in bowel habits.     Denies anal fissure, black tarry stools, constipation, diarrhea, diverticulosis, heme positive stool, hemorrhoids, irritable bowel syndrome, jaundice, light color stool, liver problems, rectal bleeding, and  rectal pain.    Current Medications (verified): 1)  Levothyroxine Sodium 137 Mcg Tabs (Levothyroxine Sodium) .... Take 1 By Mouth Qd 2)  Vitamin D 1000 Unit Tabs  (Cholecalciferol) .... Once Daily As Needed 3)  Omeprazole 20 Mg Cpdr (Omeprazole) .Marland Kitchen.. 1 By Mouth Once Daily 4)  Crestor 5 Mg Tabs (Rosuvastatin Calcium) .Marland Kitchen.. 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 Cholecystectomy (2007) Tonsillectomy (child0 Sinus surgery (1999) melanoma in situ excision right ant thigh -(2010)  Family History: Family History of Arthritis (parents) Family History Diabetes 1st degree relative (Maternal uncle) Family History Hypertension (parents & grandparents) Family History of Stroke F 1st degree relative <60 (parents & grandparents) Heart disease (parents & grandparents) Sickle trait father is 78 - sudden MI summer 2010 Mother died of either stroke or heart attack at age 62 sister dx breast cancer 25yo No FH of Colon Cancer:  Social History: Engineer, structural Married occasional alcohol 2 grown daughters with grandchildren + 2 adopted children, living at home. Patient has never smoked.  Daily Caffeine Use: one daily  Review of Systems       The patient  complains of allergy/sinus, cough, and voice change.  The patient denies anemia, anxiety-new, arthritis/joint pain, back pain, blood in urine, breast changes/lumps, change in vision, confusion, coughing up blood, depression-new, fainting, fatigue, fever, headaches-new, hearing problems, heart murmur, heart rhythm changes, itching, menstrual pain, muscle pains/cramps, night sweats, nosebleeds, pregnancy symptoms, shortness of breath, skin rash, sleeping problems, sore throat, swelling of feet/legs, swollen lymph glands, thirst - excessive , urination - excessive , urination changes/pain, urine leakage, and vision changes.         All other systems were reviewed and were negative   Vital Signs:  Patient profile:   55 year old female Height:      65 inches Weight:      156 pounds BMI:     26.05 BSA:     1.78 Pulse rate:   68 / minute Pulse rhythm:   regular BP sitting:   110 / 60  (left arm) Cuff size:   regular  Vitals Entered By: Ok Anis CMA (June 27, 2010 8:39 AM)  Physical Exam  Additional Exam:  On physical exam she is a well-developed well-nourished female  skin: anicteric HEENT: normocephalic; PEERLA; no nasal or pharyngeal abnormalities neck: supple nodes: no cervical lymphadenopathy chest: clear to ausculatation and percussion heart: no murmurs, gallops, or rubs abd: soft, nontender; BS normoactive; no abdominal masses, tenderness, organomegaly rectal: deferred ext: no cynanosis, clubbing, edema skeletal: no deformities neuro: oriented x 3; no focal abnormalities    Impression & Recommendations:  Problem # 1:  GERD (ICD-530.81)  Symptoms of chest tightness may certainly be related to GERD.  She appears to have had a partial response to omeprazole.   Active peptic disease is less likely.  H. pylori infection and Barrett's should be ruled out.  She seems to primarily have nocturnal symptoms.  Recommendations #1 switch from omeprazole to Zegerid 40 mg q.h.s. #2  upper endoscopy  Orders: EGD (EGD)  Problem # 2:  HYPERTENSION (ICD-401.9) Assessment: Comment Only  Patient Instructions: 1)  Copy sent to : Newt Lukes MD 2)  Conscious Sedation brochure given.  3)  Upper Endoscopy brochure given.  4)  Your EGD is scheduled on 07/12/2010 at 2pm 5)  You can pick up your rx from your pharmacy today 6)  The medication list was reviewed and reconciled.  All changed / newly prescribed medications were explained.  A complete medication list was provided to the patient / caregiver. Prescriptions: ZEGERID 40-1100 MG CAPS (OMEPRAZOLE-SODIUM BICARBONATE) take one tab at that time  #30 x 2   Entered and Authorized by:   Louis Meckel MD   Signed by:   Louis Meckel MD on 06/27/2010   Method used:   Electronically to        [facility] [address] [phone]* (retail)       [address]       [place], [place]  [address]       Ph: [phone] or [phone]       Fax: [phone]   RxID:   [id number]

## 2010-10-04 NOTE — Assessment & Plan Note (Signed)
Summary: MUSCLE PROBLEM/NWS   Vital Signs:  Patient profile:   55 year old female Height:      65 inches (165.10 cm) Weight:      151.6 pounds (68.91 kg) O2 Sat:      96 % on Room air Temp:     98.1 degrees F (36.72 degrees C) oral Pulse rate:   66 / minute BP sitting:   118 / 80  (left arm) Cuff size:   regular  Vitals Entered By: Orlan Leavens (December 11, 2009 1:09 PM)  O2 Flow:  Room air CC: Muscle problem Is Patient Diabetic? No Pain Assessment Patient in pain? yes     Location: whole body Type: aching   Primary Care Provider:  Newt Lukes MD  CC:  Muscle problem.  History of Present Illness: complains of muscle tenderness everywhere - tried massage - but too tender diffusely to be relaxed - pain seemed just low back initially, now acrodd torso, ribs, legs, arms and stomach - stopped crestor last PM after reading online about symptoms - reports prev with nonsp elevation of CPK approx 3 years ago - verified with repeat labs, then normalized    Current Medications (verified): 1)  Levothyroxine Sodium 137 Mcg Tabs (Levothyroxine Sodium) .... Take 1 By Mouth Qd 2)  Aspir-Low 81 Mg Tbec (Aspirin) .... Take 1 By Mouth Qd 3)  Crestor 5 Mg Tabs (Rosuvastatin Calcium) .... Take 1 At Bedtime 4)  Vitamin D 1000 Unit Tabs (Cholecalciferol) .... Once Daily  Allergies (verified): No Known Drug Allergies  Past History:  Past Medical History: Allergic rhinitis Asthma hypothyroidism dyslipidemia  melanoma, right thigh  MD rooster: gyn - [facility] - [place], [facility]  Review of Systems  The patient denies fever, peripheral edema, headaches, and muscle weakness.    Physical Exam  General:  alert, well-developed, well-nourished, and cooperative to examination.   Msk:  No deformity or scoliosis noted of thoracic or lumbar spine.  tender diffusely to mod-deep palp efforts -  Neurologic:  alert & oriented X3 and cranial nerves II-XII symetrically intact.   strength normal in all extremities, sensation intact to light touch, and gait normal. speech fluent without dysarthria or aphasia; follows commands with good comprehension.    Impression & Recommendations:  Problem # 1:  MYALGIA (ICD-729.1) ?med SE - stop statin - check CK total level now -  hydrate if elev CK, recheck in next few das to weeks off statin -  ?need rheum eval given hx same if remains abn off statin Her updated medication list for this problem includes:    Aspir-low 81 Mg Tbec (Aspirin) .Marland Kitchen... Take 1 by mouth qd  Orders: TLB-CK Total Only(Creatine Kinase/CPK) (82550-CK)  Problem # 2:  HYPERLIPIDEMIA, BORDERLINE (ICD-272.4) stop statin due to muscleskel adv SE - see evals above consider alt tx as needed  The following medications were removed from the medication list:    Crestor 5 Mg Tabs (Rosuvastatin calcium) .Marland Kitchen... Take 1 at bedtime  Labs Reviewed: SGOT: 15 (11/20/2009)   SGPT: 18 (11/20/2009)   HDL:46.30 (11/20/2009), 47.10 (08/01/2009)  LDL:54 (11/20/2009), 130 (78/46/9629)  Chol:113 (11/20/2009), 217 (08/01/2009)  Trig:64.0 (11/20/2009), 80.0 (08/01/2009)  Problem # 3:  other Time spent with patient 25 minutes, more than 50% of this time was spent counseling patient on myalgias and myolysis and problems concerning the need for eval and tx of same including cessation of statin tx  Complete Medication List: 1)  Levothyroxine Sodium 137 Mcg Tabs (Levothyroxine sodium) .Marland KitchenMarland KitchenMarland Kitchen  Take 1 by mouth qd 2)  Aspir-low 81 Mg Tbec (Aspirin) .... Take 1 by mouth qd 3)  Vitamin D 1000 Unit Tabs (Cholecalciferol) .... Once daily  Patient Instructions: 1)  it was good to see you today.  2)  test(s) ordered today - your results will be called you once reviewed. 3)  Drink as much fluid as you can tolerate for the next few days. 4)  stop crestor - will avoid statins in future for cholesterol treatment (if any needed)

## 2010-10-04 NOTE — Progress Notes (Signed)
Summary: ABX refill?  Phone Note Call from Patient Call back at [facility] Phone [phone] Call back at Work Phone [phone]   Caller: Patient Summary of Call: Pt called stating she is still having sinus infection syptoms. Pt is requesting refill of ABX or stronger ABX, please advise. Initial call taken by: Margaret Pyle, CMA,  August 07, 2010 1:26 PM  Follow-up for Phone Call        ok - levaquin once daily x 10d, erx done - call for ov if still unimproved, sooner if worse - thanks Follow-up by: Newt Lukes MD,  August 07, 2010 4:35 PM  Additional Follow-up for Phone Call Additional follow up Details #1::        Pt advised Additional Follow-up by: Margaret Pyle, CMA,  August 07, 2010 4:38 PM    New/Updated Medications: LEVAQUIN 500 MG TABS (LEVOFLOXACIN) 1 by mouth once daily x 10d Prescriptions: LEVAQUIN 500 MG TABS (LEVOFLOXACIN) 1 by mouth once daily x 10d  #10 x 0   Entered and Authorized by:   Newt Lukes MD   Signed by:   Newt Lukes MD on 08/07/2010   Method used:   Electronically to        [facility] [address] [phone]* (retail)       [address]       [place], [place]  [address]       Ph: [phone] or [phone]       Fax: [phone]   RxID:   [id number]

## 2010-10-04 NOTE — Assessment & Plan Note (Signed)
Summary: NOT FEELING WELL/CD   Vital Signs:  Patient profile:   55 year old female Height:      65 inches (165.10 cm) Weight:      152.0 pounds (69.09 kg) O2 Sat:      97 % on Room air Temp:     97.6 degrees F (36.44 degrees C) oral Pulse rate:   65 / minute BP sitting:   110 / 78  (left arm) Cuff size:   regular  Vitals Entered By: Orlan Leavens RMA (May 15, 2010 9:06 AM)  O2 Flow:  Room air CC: Not feeling well Is Patient Diabetic? No Pain Assessment Patient in pain? no        Primary Care Provider:  Newt Lukes MD  CC:  Not feeling well.  History of Present Illness: c/o dyspnea  constant but worse with progression of day -  hx same - cardiac w/u 2008 for same unremarkable (at [facility] with dr. gates) a/w tight feeling in chest and neck - "hard to take a deep breath" - feels abd bloating keeping her from breathing normally - a/w excess flatulence/gas no cough or pleurisy or burning reflux - no CP or edema - no change in meds vauge epigastric pain - no n/v or changein bowels variable relief of symptoms with maalox -  not improved with dc of ASA use 01/2010 no fever, night sweats or weight loss, some weight gain no anemia per eval with gyn last week  dyslipidemia- stopped crestor 12/2009 due to myalgias, now improved/resolved reports prev with nonsp elevation of CPK approx 3 years ago - verified with repeat labs, then normalized would like lipids recheck now   hypothyroid - reports compliance with ongoing medical treatment and no changes in medication dose or frequency. denies adverse side effects related to current therapy. no skin/hair change, +weight gain  ?GERD - see discussion above with dyspnea - no regular NSAID use, no hx ulcers   Clinical Review Panels:  Prevention   Last Mammogram:  No specific mammographic evidence of malignancy.   (04/02/2009)   Last Pap Smear:  Interpretation/Result:Negative for intraepithelial Lesion or Malignancy.  (12/01/2008)   Last Colonoscopy:  normal (11/04/2003)  Lipid Management   Cholesterol:  113 (11/20/2009)   LDL (bad choesterol):  54 (11/20/2009)   HDL (good cholesterol):  46.30 (11/20/2009)  CBC   WBC:  5.5 (11/20/2009)   RBC:  4.66 (11/20/2009)   Hgb:  13.8 (11/20/2009)   Hct:  41.2 (11/20/2009)   Platelets:  178.0 (11/20/2009)   MCV  88.4 (11/20/2009)   MCHC  33.4 (11/20/2009)   RDW  12.3 (11/20/2009)   PMN:  65.3 (11/20/2009)   Lymphs:  22.3 (11/20/2009)   Monos:  8.3 (11/20/2009)   Eosinophils:  3.1 (11/20/2009)   Basophil:  1.0 (11/20/2009)  Complete Metabolic Panel   Glucose:  91 (11/20/2009)   Sodium:  142 (11/20/2009)   Potassium:  4.1 (11/20/2009)   Chloride:  109 (11/20/2009)   CO2:  30 (11/20/2009)   BUN:  15 (11/20/2009)   Creatinine:  0.6 (11/20/2009)   Albumin:  3.9 (11/20/2009)   Total Protein:  6.7 (11/20/2009)   Calcium:  9.5 (11/20/2009)   Total Bili:  0.9 (11/20/2009)   Alk Phos:  77 (11/20/2009)   SGPT (ALT):  18 (11/20/2009)   SGOT (AST):  15 (11/20/2009)   Current Medications (verified): 1)  Levothyroxine Sodium 137 Mcg Tabs (Levothyroxine Sodium) .... Take 1 By Mouth Qd 2)  Vitamin D 1000 Unit  Tabs (Cholecalciferol) .... Once Daily As Needed  Allergies (verified): No Known Drug Allergies  Past History:  Past Medical History: Allergic rhinitis Asthma hypothyroidism dyslipidemia  melanoma, right thigh  MD roster: gyn - [facility] - [place], [facility] - prev weisman,  to be [facility]  Review of Systems  The patient denies hoarseness, chest pain, syncope, peripheral edema, headaches, and severe indigestion/heartburn.    Physical Exam  General:  alert, well-developed, well-nourished, and cooperative to examination.   Eyes:  vision grossly intact; pupils equal, round and reactive to light.  conjunctiva and lids normal.    Ears:  normal pinnae bilaterally, without erythema, swelling, or tenderness to palpation. TMs clear, no effusion,  no cerumen impaction. Hearing grossly normal bilaterally  Mouth:  teeth and gums in good repair; mucous membranes moist, without lesions or ulcers. oropharynx clear without exudate, no erythema.  Lungs:  normal respiratory effort, no intercostal retractions or use of accessory muscles; normal breath sounds bilaterally - no crackles and no wheezes.    Heart:  normal rate, regular rhythm, no murmur, and no rub. BLE without edema. Abdomen:  soft, non-tender, normal bowel sounds, no distention; no masses and no appreciable hepatomegaly or splenomegaly.   Psych:  Oriented X3, memory intact for recent and remote, normally interactive, good eye contact, not anxious appearing, not depressed appearing, and not agitated.      Impression & Recommendations:  Problem # 1:  DYSPNEA (ICD-786.05) ?GI symptom of untx mild reflux - pulm exam clear, not exertional and O2 normal - prior cardiac eval for same symptoms in 2008 unremarkable - will start PPI and refer to GI given constellation of other GI symptoms (bloating/gas, epigastric pain) - pt prefers [facility] group  Problem # 2:  GERD (ICD-530.81) see above d/w dyspnea Her updated medication list for this problem includes:    Omeprazole 20 Mg Cpdr (Omeprazole) .Marland Kitchen... 1 by mouth once daily  Orders: Prescription Created Electronically [phone]) Gastroenterology Referral (GI)  Problem # 3:  UNSPECIFIED HYPOTHYROIDISM (ICD-244.9)  Her updated medication list for this problem includes:    Levothyroxine Sodium 137 Mcg Tabs (Levothyroxine sodium) .Marland Kitchen... Take 1 by mouth qd  Orders: TLB-TSH (Thyroid Stimulating Hormone) (84443-TSH)  check labs when returns for fasting lipids prior labs reviewd - plan to cont same dosing pending labs  Labs Reviewed: TSH: 0.52 (11/20/2009)    Chol: 113 (11/20/2009)   HDL: 46.30 (11/20/2009)   LDL: 54 (11/20/2009)   TG: 64.0 (11/20/2009)  Problem # 4:  HYPERLIPIDEMIA, BORDERLINE (ICD-272.4)  Orders: TLB-Lipid Panel  (80061-LIPID)  stop statin 12/2009 due to muscleskel adv SE -  consider alt tx as needed  recheck FLP  Labs Reviewed: SGOT: 15 (11/20/2009)   SGPT: 18 (11/20/2009)   HDL:46.30 (11/20/2009), 47.10 (08/01/2009)  LDL:54 (11/20/2009), 130 (08/65/7846)  Chol:113 (11/20/2009), 217 (08/01/2009)  Trig:64.0 (11/20/2009), 80.0 (08/01/2009)  Complete Medication List: 1)  Levothyroxine Sodium 137 Mcg Tabs (Levothyroxine sodium) .... Take 1 by mouth qd 2)  Vitamin D 1000 Unit Tabs (Cholecalciferol) .... Once daily as needed 3)  Omeprazole 20 Mg Cpdr (Omeprazole) .Marland Kitchen.. 1 by mouth once daily  Patient Instructions: 1)  it was good to see you today. 2)  start omeprazole for probable reflux symptoms - your prescriptions have been electronically submitted to your pharmacy. Please take as directed. Contact our office if you believe you're having problems with the medication(s).  3)  we'll make referral to [facility] for further evaluation as discussed. Our office will contact you regarding this  appointment once made.  4)  test(s) ordered today - thyroid and cholesterol - return in AM when fasting for lab only - your results will then be posted on the phone tree for review in 48-72 hours from the time of test completion; call [phone] and enter your 9 digit MRN (listed above on this page, just below your name); if any changes need to be made or there are abnormal results, you will be contacted directly.  Prescriptions: OMEPRAZOLE 20 MG CPDR (OMEPRAZOLE) 1 by mouth once daily  #30 x 2   Entered and Authorized by:   Newt Lukes MD   Signed by:   Newt Lukes MD on 05/15/2010   Method used:   Electronically to        [facility] [address] [phone]* (retail)       [address]       [place], [place]  [address]       Ph: [phone] or [phone]       Fax: [phone]   RxID:   [id number]

## 2010-10-04 NOTE — Progress Notes (Signed)
Summary: Chol med req  Phone Note Call from Patient Call back at Work Phone [phone]   Summary of Call: Pt called stating that there has been an elevation of her Chol since last lipid panel in March 2011. Pt says she watches her diet and exercises but she has a family Hx of CVA and MI. Pt is requesting to go back to taking Crestor or similar medicine to help control chol. Please advise? Crestor D/Cd due to myalgias 12/2009 Initial call taken by: Margaret Pyle, CMA,  June 08, 2010 1:08 PM  Follow-up for Phone Call        ok to resume crestor 5mg  at bedtime or livalo 1mg  at bedtime or lipitor 10mg  at bedtime - i feel comfortable with any of these medications for her cholesterol tx - pt may choose and then erx as needed - will followup labs at next OV - thanks! Follow-up by: Newt Lukes MD,  June 08, 2010 1:18 PM  Additional Follow-up for Phone Call Additional follow up Details #1::        left message on machine for pt to return my call. Margaret Pyle, CMA  June 08, 2010 1:23 PM  Pt advised and chose to try Crestor because she had used it before. Rx sent to pharmacy per pt req. Additional Follow-up by: Margaret Pyle, CMA,  June 08, 2010 3:23 PM    New/Updated Medications: CRESTOR 5 MG TABS (ROSUVASTATIN CALCIUM) 1 tab by mouth at bedtime Prescriptions: CRESTOR 5 MG TABS (ROSUVASTATIN CALCIUM) 1 tab by mouth at bedtime  #30 x 2   Entered by:   Margaret Pyle, CMA   Authorized by:   Newt Lukes MD   Signed by:   Margaret Pyle, CMA on 06/08/2010   Method used:   Electronically to        [facility] [address] [phone]* (retail)       [address]       [place], [place]  [address]       Ph: [phone] or [phone]       Fax: [phone]   RxID:   [id number]

## 2010-10-04 NOTE — Letter (Signed)
Summary: New Patient letter  [facility]  [address] [place], [place] [address]   Phone: [phone]  Fax: [phone]       05/15/2010 MRN: [id number]  [facility] [address] [place], [place]  [address]  Dear Ms. Gram,  Welcome to the Gastroenterology Division at [facility].    You are scheduled to see Dr. Arlyce Dice on 06/27/2010 at 8:30AM on the 3rd floor at [facility], [address].  We ask that you try to arrive at our office 15 minutes prior to your appointment time to allow for check-in.  We would like you to complete the enclosed self-administered evaluation form prior to your visit and bring it with you on the day of your appointment.  We will review it with you.  Also, please bring a complete list of all your medications or, if you prefer, bring the medication bottles and we will list them.  Please bring your insurance card so that we may make a copy of it.  If your insurance requires a referral to see a specialist, please bring your referral form from your primary care physician.  Co-payments are due at the time of your visit and may be paid by cash, check or credit card.     Your office visit will consist of a consult with your physician (includes a physical exam), any laboratory testing he/she may order, scheduling of any necessary diagnostic testing (e.g. x-ray, ultrasound, CT-scan), and scheduling of a procedure (e.g. Endoscopy, Colonoscopy) if required.  Please allow enough time on your schedule to allow for any/all of these possibilities.    If you cannot keep your appointment, please call [phone] to cancel or reschedule prior to your appointment date.  This allows [name] the opportunity to schedule an appointment for another patient in need of care.  If you do not cancel or reschedule by 5 p.m. the business day prior to your appointment date, you will be charged a $50.00 late cancellation/no-show fee.    Thank you for  choosing [facility] for your medical needs.  We appreciate the opportunity to care for you.  Please visit [name] at our website  to learn more about our practice.                     Sincerely,                                                             The Gastroenterology Division

## 2010-10-04 NOTE — Letter (Signed)
Summary: Results Letter  [facility]  [address] [place], [place] [address]   Phone: [phone]  Fax: [phone]        June 27, 2010 MRN: [id number]    [facility] [address] [place], [place]  [address]    Dear Lindsey Ferrell,  It is my pleasure to have treated you recently as a new patient in my office. I appreciate your confidence and the opportunity to participate in your care.  Since I do have a busy inpatient endoscopy schedule and office schedule, my office hours vary weekly. I am, however, available for emergency calls everyday through my office. If I am not available for an urgent office appointment, another one of our gastroenterologist will be able to assist you.  My well-trained staff are prepared to help you at all times. For emergencies after office hours, a physician from our Gastroenterology section is always available through my 24 hour answering service  Once again I welcome you as a new patient and I look forward to a happy and healthy relationship             Sincerely,  Louis Meckel MD  This letter has been electronically signed by your physician.  Appended Document: Results Letter letter mailed

## 2010-10-08 ENCOUNTER — Encounter: Payer: Self-pay | Admitting: Gastroenterology

## 2010-10-08 ENCOUNTER — Ambulatory Visit (INDEPENDENT_AMBULATORY_CARE_PROVIDER_SITE_OTHER): Payer: Commercial Managed Care - PPO | Admitting: Gastroenterology

## 2010-10-08 DIAGNOSIS — K219 Gastro-esophageal reflux disease without esophagitis: Secondary | ICD-10-CM

## 2010-10-10 NOTE — Assessment & Plan Note (Signed)
Summary: ?sinus infection/val/lb   Vital Signs:  Patient profile:   55 year old female Height:      65 inches Weight:      163.25 pounds BMI:     27.26 O2 Sat:      96 % on Room air Temp:     98.4 degrees F oral Pulse rate:   83 / minute Pulse rhythm:   regular BP sitting:   106 / 72  (left arm) Cuff size:   regular  Vitals Entered By: Rock Nephew CMA (October 02, 2010 1:56 PM)  O2 Flow:  Room air CC: Patient c/o headache, sore throat, runny nose, and R ear pressure Is Patient Diabetic? No  Does patient need assistance? Functional Status Self care Ambulation Normal   Primary Care Provider:  Newt Lukes MD  CC:  Patient c/o headache, sore throat, runny nose, and and R ear pressure.  History of Present Illness: Lindsey Ferrell presents with a two week h/o sinus infection type symptoms. She has had pressure and headache in the frontal and maxillary sinus area; feels tight in the area of the ears. she has had low grade fever. She has been using naxonex, mucinex, hot tea, contact. She has purulent sinus drainage with bitter metallic gtaste. She says this has been going around the family.   Allergies: No Known Drug Allergies  Past History:  Past Medical History: Last updated: 06/27/2010 Allergic rhinitis Asthma hypothyroidism dyslipidemia   melanoma, right thigh GERD  MD roster: gyn - mezier derm - [place], [facility] - [facility]plan  Past Surgical History: Last updated: 11/22/2009 Cholecystectomy (2007) Tonsillectomy (child0 Sinus surgery (1999) melanoma in situ excision right ant thigh -(2010) FH reviewed for relevance, SH/Risk Factors reviewed for relevance  Review of Systems       The patient complains of fever and decreased hearing.  The patient denies anorexia, weight loss, vision loss, hoarseness, syncope, dyspnea on exertion, prolonged cough, headaches, abdominal pain, severe indigestion/heartburn, muscle weakness, difficulty walking, depression,  abnormal bleeding, and angioedema.    Physical Exam  General:  alert, well-developed, well-nourished, well-hydrated, and healthy-appearing.   Head:  no tenderness to percussion over the frontal and maxillary sinuses Eyes:  pupils equal and pupils round.  C&S clear Ears:  EACs, TMs normal in appearance. Poor movement of TMs with insuffulation. Nose:  no external deformity, no external erythema, and no nasal discharge.   Mouth:  posterior pharynx with mild degree of cobblestone appearance, no frank exudate Neck:  supple.   Lungs:  normal respiratory effort and normal breath sounds.   Heart:  normal rate and regular rhythm.   Skin:  turgor normal and color normal.   Cervical Nodes:  no anterior cervical adenopathy and no posterior cervical adenopathy.   Psych:  normally interactive, good eye contact, and not anxious appearing.     Impression & Recommendations:  Problem # 1:  SINUSITIS, ACUTE (ICD-461.9) Persistent sinus symptoms with unremarkable exam. Definite eustachian tube dysfunction.  Plan - Amox 875mg  two times a day x 7           continue supportive care.   Her updated medication list for this problem includes:    Nasonex 50 Mcg/act Susp (Mometasone furoate) .Marland Kitchen... 1 spray every nostril once daily    Amoxicillin 875 Mg Tabs (Amoxicillin) .Marland Kitchen... 1 by mouth two times a day x 7 for uri  Complete Medication List: 1)  Levothyroxine Sodium 137 Mcg Tabs (Levothyroxine sodium) .... Take 1 by mouth qd 2)  Vitamin D 1000 Unit Tabs (Cholecalciferol) .... Once daily as needed 3)  Crestor 5 Mg Tabs (Rosuvastatin calcium) .Marland Kitchen.. 1 tab by mouth at bedtime 4)  Zegerid 40-1100 Mg Caps (Omeprazole-sodium bicarbonate) .... Take one tab at that time 5)  Nasonex 50 Mcg/act Susp (Mometasone furoate) .Marland Kitchen.. 1 spray every nostril once daily 6)  Amoxicillin 875 Mg Tabs (Amoxicillin) .Marland Kitchen.. 1 by mouth two times a day x 7 for uri  Patient Instructions: 1)  Upper respiratory infection with eustachian tube  dysfunction - plan : amoxicillin 875mg  two times a day x 7 days; sudafed 30mg  two times a day or three times a day and cough syrup of choice with DM (dxtromethorophan). fljuids, vitamin C 1500mg  daily, ecchinacea if you believe.  Prescriptions: AMOXICILLIN 875 MG TABS (AMOXICILLIN) 1 by mouth two times a day x 7 for URI  #14 x 0   Entered and Authorized by:   Jacques Navy MD   Signed by:   Jacques Navy MD on 10/02/2010   Method used:   Electronically to        [facility] [address] [phone]* (retail)       [address]       [place], [place]  [address]       Ph: [phone] or [phone]       Fax: [phone]   RxID:   [id number]    Orders Added: 1)  Est. Patient Level III [[address]]

## 2010-10-18 NOTE — Assessment & Plan Note (Signed)
Summary: F/u from procedure    History of Present Illness Visit Type: Follow-up Visit Primary GI MD: Melvia Heaps MD [facility] Primary Provider: Newt Lukes MD Requesting Provider: na Chief Complaint: F/u from EGD. Pt denies any GI complaints  History of Present Illness:   Lindsey Ferrell has returned for followup of her GERD.  She is now off medications and is feeling well except for occasional minimal discomfort in the midepigastrium.  Endoscopy in November, 2011 was normal.   GI Review of Systems      Denies abdominal pain, acid reflux, belching, bloating, chest pain, dysphagia with liquids, dysphagia with solids, heartburn, loss of appetite, nausea, vomiting, vomiting blood, weight loss, and  weight gain.        Denies anal fissure, black tarry stools, change in bowel habit, constipation, diarrhea, diverticulosis, fecal incontinence, heme positive stool, hemorrhoids, irritable bowel syndrome, jaundice, light color stool, liver problems, rectal bleeding, and  rectal pain.    Current Medications (verified): 1)  Levothyroxine Sodium 137 Mcg Tabs (Levothyroxine Sodium) .... Take 1 By Mouth Qd 2)  Vitamin D 1000 Unit Tabs (Cholecalciferol) .... Once Daily As Needed 3)  Nasonex 50 Mcg/act Susp (Mometasone Furoate) .Marland Kitchen.. 1 Spray Every Nostril Once Daily 4)  Amoxicillin 875 Mg Tabs (Amoxicillin) .Marland Kitchen.. 1 By Mouth Two Times A Day (Two Left)  Allergies (verified): No Known Drug Allergies  Past History:  Past Medical History: Reviewed history from 06/27/2010 and no changes required. Allergic rhinitis Asthma hypothyroidism dyslipidemia   melanoma, right thigh GERD  MD roster: gyn - mezier derm - [place], [facility] - [place] Leeth  Past Surgical History: Reviewed history from 11/22/2009 and no changes required. Cholecystectomy (2007) Tonsillectomy (child0 Sinus surgery (1999) melanoma in situ excision right ant thigh -(2010)  Family History: Reviewed history from 06/27/2010 and  no changes required. Family History of Arthritis (parents) Family History Diabetes 1st degree relative (Maternal uncle) Family History Hypertension (parents & grandparents) Family History of Stroke F 1st degree relative <60 (parents & grandparents) Heart disease (parents & grandparents) Sickle trait father is 25 - sudden MI summer 2010 Mother died of either stroke or heart attack at age 64 sister dx breast cancer 37yo No FH of Colon Cancer:  Social History: Reviewed history from 06/27/2010 and no changes required. [employer] Married occasional alcohol 2 grown daughters with grandchildren + 2 adopted children, living at home. Patient has never smoked.  Daily Caffeine Use: one daily   Review of Systems       The patient complains of allergy/sinus and cough.  The patient denies anemia, anxiety-new, arthritis/joint pain, back pain, blood in urine, breast changes/lumps, change in vision, confusion, coughing up blood, depression-new, fainting, fatigue, fever, headaches-new, hearing problems, heart murmur, heart rhythm changes, itching, menstrual pain, muscle pains/cramps, night sweats, nosebleeds, pregnancy symptoms, shortness of breath, skin rash, sleeping problems, sore throat, swelling of feet/legs, swollen lymph glands, thirst - excessive , urination - excessive , urination changes/pain, urine leakage, vision changes, and voice change.         All other systems were reviewed and were negative   Vital Signs:  Patient profile:   55 year old female Height:      65 inches Weight:      161 pounds BMI:     26.89 BSA:     1.81 Pulse rate:   68 / minute Pulse rhythm:   regular BP sitting:   122 / 74  (left arm) Cuff size:   regular  Vitals  Entered By: Ok Anis CMA (October 08, 2010 9:14 AM)  Physical Exam  Additional Exam:  Physical Exam She Is a Well-Developed Well-Nourished Female     Impression & Recommendations:  Problem # 1:  GERD (ICD-530.81) She is   asymptomatic off medicines.  She will use PPI therapy or antacids as needed.  Patient Instructions: 1)  Copy sent to : Newt Lukes MD 2)  Follow up as needed  3)  The medication list was reviewed and reconciled.  All changed / newly prescribed medications were explained.  A complete medication list was provided to the patient / caregiver.

## 2010-12-31 ENCOUNTER — Encounter: Payer: Self-pay | Admitting: Internal Medicine

## 2011-01-01 ENCOUNTER — Ambulatory Visit (INDEPENDENT_AMBULATORY_CARE_PROVIDER_SITE_OTHER): Payer: 59 | Admitting: Internal Medicine

## 2011-01-01 ENCOUNTER — Encounter: Payer: Self-pay | Admitting: Internal Medicine

## 2011-01-01 DIAGNOSIS — R5381 Other malaise: Secondary | ICD-10-CM

## 2011-01-01 DIAGNOSIS — E039 Hypothyroidism, unspecified: Secondary | ICD-10-CM

## 2011-01-01 DIAGNOSIS — E785 Hyperlipidemia, unspecified: Secondary | ICD-10-CM

## 2011-01-01 DIAGNOSIS — R5383 Other fatigue: Secondary | ICD-10-CM

## 2011-01-01 DIAGNOSIS — F320 Major depressive disorder, single episode, mild: Secondary | ICD-10-CM | POA: Insufficient documentation

## 2011-01-01 MED ORDER — ALIGN PO CAPS: 1.0000 | ORAL_CAPSULE | Freq: Every day | ORAL | Status: DC

## 2011-01-01 NOTE — Assessment & Plan Note (Signed)
Unable to tolerate simva and crestor trials in past due to myalgias Encouraged attention to diet and exercise with weight control Check lipids now (when fasting)

## 2011-01-01 NOTE — Assessment & Plan Note (Signed)
Nonspecific hx and exam - check labs now Consider low dose wellbutrin or other med for depression

## 2011-01-01 NOTE — Assessment & Plan Note (Signed)
Weight gain and fatigue - recheck TSH now and adjust as needed - Lab Results  Component Value Date   TSH 1.65 05/18/2010

## 2011-01-01 NOTE — Progress Notes (Signed)
  Subjective:    Patient ID: Lindsey Ferrell, female    DOB: 29-Jan-1956, 55 y.o.   MRN: [id number]  HPI  Here for follow up - complains of fatigue -  associated with loss of interest in previously enjoyable activities, excess physical exhaustion, sleeping a lot No tearfulness or irritability - no weight loss, +weight gain No hx depression but ?mild degree now  Also reviewed chronic medical issues today  dyslipidemia- stopped crestor 12/2009 due to myalgias resumed med tx 06/2010 - stopped agan 10/2010 due to mysalgia symptoms  reports prev with nonsp elevation of CPK ?2008- verified with repeat labs, then normalized  hypothyroid - reports compliance with ongoing medical treatment and no changes in medication dose or frequency. denies adverse side effects related to current therapy. no skin/hair change, +weight gain  GERD - no regular NSAID use, no hx ulcers  Past Medical History  Diagnosis Date  . Deficiency of other vitamins   . Sickle-cell trait   . Abnormal glandular Papanicolaou smear of cervix   . ALLERGIC RHINITIS, SEASONAL   . ASTHMA   . Melanoma     (R) thigh  . HYPERTENSION   . HYPERLIPIDEMIA, BORDERLINE   . GERD   . THYROID NODULE, RIGHT   . Unspecified hypothyroidism     Review of Systems  Respiratory: Negative for shortness of breath.   Cardiovascular: Negative for chest pain.  Neurological: Negative for headaches.  also see HPI above     Objective:   Physical Exam BP 112/72  Pulse 78  Temp(Src) 98.3 F (36.8 C) (Oral)  Ht 5\' 5"  (1.651 m)  Wt 162 lb 1.9 oz (73.537 kg)  BMI 26.98 kg/m2  SpO2 95% Physical Exam  Constitutional: She is oriented to person, place, and time. She appears well-developed and well-nourished. No distress.  Neck: Normal range of motion. Neck supple. No JVD present. No thyromegaly present.  Cardiovascular: Normal rate, regular rhythm and normal heart sounds.  No murmur heard. Pulmonary/Chest: Effort normal and breath sounds  normal. No respiratory distress. She has no wheezes.  Psychiatric: She has a normal mood and affect. Her behavior is normal. Judgment and thought content normal.   Lab Results  Component Value Date   WBC 5.5 11/20/2009   HGB 13.8 11/20/2009   HCT 41.2 11/20/2009   PLT 178.0 11/20/2009   CHOL 193 05/18/2010   TRIG 62.0 05/18/2010   HDL 52.10 05/18/2010   LDLDIRECT 154.4 08/01/2009   ALT 18 11/20/2009   AST 15 11/20/2009   NA 142 11/20/2009   K 4.1 11/20/2009   CL 109 11/20/2009   CREATININE 0.6 11/20/2009   BUN 15 11/20/2009   CO2 30 11/20/2009   TSH 1.65 05/18/2010   Wt Readings from Last 3 Encounters:  01/01/11 162 lb 1.9 oz (73.537 kg)  10/08/10 161 lb (73.029 kg)  10/02/10 163 lb 4 oz (74.05 kg)       Assessment & Plan:  See problem list. Medications and labs reviewed today.

## 2011-01-01 NOTE — Patient Instructions (Signed)
It was good to see you today. We have reviewed your prior records including labs and tests today Test(s) ordered today. Your results will be called to you after review (48-72hours after test completion). If any changes need to be made, you will be notified at that time. If normal labs, will start low dose wellbutrin (generic) for possible depression symptoms - would then need follow up in 6-8 weeks to review symptoms and medication dosing Work on lifestyle changes as discussed (low fat, low carb, increased protein diet; improved exercise efforts; weight loss) to control sugar, blood pressure and cholesterol levels and/or reduce risk of developing other medical problems. Look into [employer] or other type of food journal to assist you in this process. Try 30days daily Align for bowels (irritable bowel symptoms) - OTC - coupon provided today

## 2011-01-02 ENCOUNTER — Other Ambulatory Visit (INDEPENDENT_AMBULATORY_CARE_PROVIDER_SITE_OTHER): Payer: 59 | Admitting: Internal Medicine

## 2011-01-02 ENCOUNTER — Other Ambulatory Visit (INDEPENDENT_AMBULATORY_CARE_PROVIDER_SITE_OTHER): Payer: 59

## 2011-01-02 ENCOUNTER — Telehealth: Payer: Self-pay | Admitting: Internal Medicine

## 2011-01-02 DIAGNOSIS — E785 Hyperlipidemia, unspecified: Secondary | ICD-10-CM

## 2011-01-02 DIAGNOSIS — R5381 Other malaise: Secondary | ICD-10-CM

## 2011-01-02 DIAGNOSIS — E039 Hypothyroidism, unspecified: Secondary | ICD-10-CM

## 2011-01-02 DIAGNOSIS — R5383 Other fatigue: Secondary | ICD-10-CM

## 2011-01-02 LAB — LIPID PANEL
Cholesterol: 213 mg/dL — ABNORMAL HIGH (ref 0–200)
HDL: 49.3 mg/dL (ref >39.00)
Total CHOL/HDL Ratio: 4
Triglycerides: 79 mg/dL (ref 0.0–149.0)

## 2011-01-02 LAB — CBC WITH DIFFERENTIAL/PLATELET
Basophils Relative: 0.7 % (ref 0.0–3.0)
Eosinophils Relative: 3.5 % (ref 0.0–5.0)
Lymphocytes Relative: 28.8 % (ref 12.0–46.0)
Monocytes Relative: 8.5 % (ref 3.0–12.0)
Neutrophils Relative %: 58.5 % (ref 43.0–77.0)
Platelets: 204 10*3/uL (ref 150.0–400.0)
RBC: 4.57 Mil/uL (ref 3.87–5.11)
WBC: 5 10*3/uL (ref 4.5–10.5)

## 2011-01-02 LAB — TSH: TSH: 0.62 u[IU]/mL (ref 0.35–5.50)

## 2011-01-02 LAB — LDL CHOLESTEROL, DIRECT: Direct LDL: 150 mg/dL

## 2011-01-02 LAB — VITAMIN B12: Vitamin B-12: 466 pg/mL (ref 211–911)

## 2011-01-02 MED ORDER — BUPROPION HCL ER (XL) 150 MG PO TB24: 150.0000 mg | ORAL_TABLET | ORAL | Status: DC

## 2011-01-02 NOTE — Telephone Encounter (Signed)
Please call patient - normal or stable test results. Following medication changes recommended: start low dose wellbutrin for fatigue symptoms (depression) as discussed at OV - erx done. Will need ROV in 6-8 wk to review, call sooner if problems Thanks.  Lab Results  Component Value Date   WBC 5.0 01/02/2011   HGB 13.6 01/02/2011   HCT 39.8 01/02/2011   PLT 204.0 01/02/2011   CHOL 213* 01/02/2011   TRIG 79.0 01/02/2011   HDL 49.30 01/02/2011   LDLDIRECT 150.0 01/02/2011   ALT 18 11/20/2009   AST 15 11/20/2009   NA 142 11/20/2009   K 4.1 11/20/2009   CL 109 11/20/2009   CREATININE 0.6 11/20/2009   BUN 15 11/20/2009   CO2 30 11/20/2009   TSH 0.62 01/02/2011

## 2011-01-03 NOTE — Telephone Encounter (Signed)
Notified pt with results...01/03/11@8 :56am/LMB

## 2011-01-14 ENCOUNTER — Ambulatory Visit (INDEPENDENT_AMBULATORY_CARE_PROVIDER_SITE_OTHER): Payer: 59 | Admitting: Internal Medicine

## 2011-01-14 ENCOUNTER — Encounter: Payer: Self-pay | Admitting: Internal Medicine

## 2011-01-14 VITALS — BP 100/80 | HR 80 | Temp 98.6°F | Ht 65.0 in

## 2011-01-14 DIAGNOSIS — J0100 Acute maxillary sinusitis, unspecified: Secondary | ICD-10-CM

## 2011-01-14 MED ORDER — AMOXICILLIN-POT CLAVULANATE 875-125 MG PO TABS: 1.0000 | ORAL_TABLET | Freq: Two times a day (BID) | ORAL | Status: AC

## 2011-01-14 NOTE — Patient Instructions (Signed)
It was good to see you today. Augmentin for sinus infection - Your prescription(s) have been submitted to your pharmacy. Please take as directed and contact our office if you believe you are having problem(s) with the medication(s). Ok to use Afrin or decongestant pills as reviewed in addition to ongoing allergy therapy

## 2011-01-14 NOTE — Progress Notes (Signed)
  Subjective:    Patient ID: Lindsey Ferrell, female    DOB: 05-Oct-1955, 55 y.o.   MRN: [id number]  complains of Sinusitis Pertinent negatives include no headaches or shortness of breath.  onset symptoms 5 days ago associated with with left ear pain and fullness as well maxillary pressure over left side LGF, yellow green nasal drainage +hx same - taking allergy rx meds as rx'd  Also reviewed chronic medical issues today  dyslipidemia- stopped crestor 12/2009 due to myalgias resumed med tx 06/2010 - stopped agan 10/2010 due to mysalgia symptoms  reports prev with nonsp elevation of CPK ?2008- verified with repeat labs, then normalized  hypothyroid - reports compliance with ongoing medical treatment and no changes in medication dose or frequency. denies adverse side effects related to current therapy. no skin/hair change, +weight gain  GERD - no regular NSAID use, no hx ulcers  Depression - started med for same (wellbutrin) early 01/2011 - no change in symptoms yet  Past Medical History  Diagnosis Date  . Deficiency of other vitamins   . Sickle-cell trait   . Abnormal glandular Papanicolaou smear of cervix   . ALLERGIC RHINITIS, SEASONAL   . ASTHMA   . Melanoma     (R) thigh  . HYPERTENSION   . HYPERLIPIDEMIA, BORDERLINE   . GERD   . THYROID NODULE, RIGHT   . Unspecified hypothyroidism     Review of Systems  Respiratory: Negative for shortness of breath.   Cardiovascular: Negative for chest pain.  Neurological: Negative for headaches.       Objective:   Physical Exam BP 100/80  Pulse 80  Temp(Src) 98.6 F (37 C) (Oral)  Ht 5\' 5"  (1.651 m)  SpO2 97% Physical Exam  Constitutional: She is oriented to person, place, and time. She appears mild-mod ill, no distress.  Neck: Normal range of motion. Neck supple. No JVD present. No thyromegaly present.  HENT: swelling and tenderness over L max region; L TM hazy with clear effusion, no erythema Cardiovascular: Normal rate,  regular rhythm and normal heart sounds.  No murmur heard. Pulmonary/Chest: Effort normal and breath sounds normal. No respiratory distress. She has no wheezes.  Psychiatric: She has a normal mood and affect. Her behavior is normal. Judgment and thought content normal.   Lab Results  Component Value Date   WBC 5.0 01/02/2011   HGB 13.6 01/02/2011   HCT 39.8 01/02/2011   PLT 204.0 01/02/2011   CHOL 213* 01/02/2011   TRIG 79.0 01/02/2011   HDL 49.30 01/02/2011   LDLDIRECT 150.0 01/02/2011   ALT 18 11/20/2009   AST 15 11/20/2009   NA 142 11/20/2009   K 4.1 11/20/2009   CL 109 11/20/2009   CREATININE 0.6 11/20/2009   BUN 15 11/20/2009   CO2 30 11/20/2009   TSH 0.62 01/02/2011   Wt Readings from Last 3 Encounters:  01/01/11 162 lb 1.9 oz (73.537 kg)  10/08/10 161 lb (73.029 kg)  10/02/10 163 lb 4 oz (74.05 kg)       Assessment & Plan:  Acute maxillary sinusitus - Augmentin erx done - cont nasal steroid spray and conservative treatment; also decongestant spray and pills prn  See problem list. Medications and labs reviewed today.

## 2011-01-18 NOTE — Op Note (Signed)
Lindsey Ferrell, DEANS            ACCOUNT NO.:  [id number]   MEDICAL RECORD NO.:  [id number]          PATIENT TYPE:  OIB   LOCATION:  5736                         FACILITY:  [facility]   PHYSICIAN:  Velora Heckler, MD      DATE OF BIRTH:  1955-10-14   DATE OF PROCEDURE:  04/23/2005  DATE OF DISCHARGE:                                 OPERATIVE REPORT   PREOPERATIVE DIAGNOSIS:  Biliary dyskinesia, abdominal pain.   POSTOPERATIVE DIAGNOSIS:  Biliary dyskinesia, chronic cholecystitis.   PROCEDURE:  Laparoscopic cholecystectomy with intraoperative  cholangiography.   SURGEON:  Velora Heckler, M.D.   ASSISTANT:  Francina Ames, M.D.   ANESTHESIA:  General per Dr. Kipp Brood.   ESTIMATED BLOOD LOSS:  Minimal.   PREPARATION:  Betadine.   COMPLICATIONS:  None.   INDICATIONS:  Patient is a 55 year old white female from [place], [place], who presents at the request of Dr. Johnella Moloney with one year  history of intermittent abdominal pain and nausea. She has fatty food  intolerance. Nuclear medicine hepatobiliary scan showed a low ejection  fraction of 30%. The patient now comes to surgery for cholecystectomy for  treatment of biliary dyskinesia.   DESCRIPTION OF PROCEDURE:  Procedure was done in OR #1 at the [facility] The patient is brought to the operating room and placed  in supine position on the operating room table. Following administration of  general anesthesia, the patient was prepped and draped in usual strict  aseptic fashion. After ascertaining that an adequate level of anesthesia had  been obtained, an infraumbilical incision was made with a #15 blade.  Dissection was carried through previous scar tissue to the fascia. Fascia is  incised in the midline. The peritoneal cavity is entered cautiously. A 0  Vicryl pursestring suture is placed in the fascia. An Hasson cannula is  introduced and secured with a pursestring suture. Abdomen is  insufflated  with carbon dioxide. Laparoscope is introduced and the abdomen explored.  Operative ports are placed along the right costal margin in the midline,  midclavicular line, and anterior axillary line. Fundus of the gallbladder is  grasped and retracted cephalad. There are significant adhesions to the  undersurface of the gallbladder. These are gently taken down with blunt  dissection and hemostasis obtained with electrocautery. Dissection is  carried down to the neck of the gallbladder. Peritoneum is incised. Cystic  duct is dissected out along its length. A clip is placed at the neck of the  gallbladder. Cystic duct is incised. Lumen is quite small. There is bile  within the cystic duct. Cook cholangiography catheter is introduced through  a stab wound in the right upper quadrant. It is inserted into the cystic  duct and secured with a Ligaclip. Using C-arm fluoroscopy, real time  cholangiography is performed. There is a normal caliber common bile duct.  There is reflux of contrast into the right and left hepatic ductal systems.  There is free flow distally into the duodenum without filling defect or  obstruction. Clip is withdrawn and the [facility] catheter is removed from the  peritoneal cavity. Cystic duct is triply clipped and divided. Cystic artery  is dissected out, doubly clipped, and divided. Gallbladder is excised from  the gallbladder bed using the hook electrocautery for hemostasis.  Gallbladder is completely excised and extracted through the umbilical port.  It is submitted to pathology for review. Right upper quadrant is irrigated  with warm saline which is evacuated. Good hemostasis was noted. Ports are  removed under direct vision and pneumoperitoneum is released. Good  hemostasis is noted at all port sites. The 0 Vicryl pursestring suture is  tied securely. All port sites are anesthetized with local anesthetic. All  wounds are closed with interrupted 4-0 Vicryl  subcuticular sutures. The  patient is awakened from anesthesia and brought to the recovery room in  stable condition. Benzoin, Steri-Strips and sterile dressings were applied  the patient all wounds. The patient tolerated the procedure well.      Velora Heckler, MD  Electronically Signed     TMG/MEDQ  D:  04/23/2005  T:  04/23/2005  Job:  [id number]   cc:   Candyce Churn, M.D.  [address] [place]  [place] [address]  Fax: [phone]   Leatha Gilding. Mezer, M.D.  [address]  [place]  [place] [address]  Fax: [phone]

## 2011-01-18 NOTE — Consult Note (Signed)
[facility]  Patient:    Lindsey Ferrell, Lindsey Ferrell                       MRN: [id number] Proc. Date: 01/30/00 Attending:  Rande Brunt. Clarke-Pearson, M.D. CC:         Leatha Gilding. Mezer, M.D.             Telford Nab, R.N.                          Consultation Report  HISTORY:  This 55 year old, white, married female, gravida 2, para 2, who is seen on referral of Dr. Alexis Frock seeking a second opinion regarding management of an abnormal Pap smear.  The patients Pap smears are reviewed back to 1997.  At that time she had one Pap smear that showed ASCUS.  Subsequent Pap smears in 1997, 1998, and 1999 were normal.  In April 2000, the patient had another ASCUS Pap smear and subsequently underwent colposcopy by Dr. 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  She had cryotherapy in July 2000. Follow-up Pap smear in August 2000 again showed ASCUS as did a Pap smear on November 16, 1999.  She subsequently had additionally colposcopy and biopsies on April 11 which showed mild dysplasia (which was slight).  A LEEP procedure has been recommended.  PAST GYNECOLOGIC HISTORY:  The patient has had difficulties with infertility and has had four cycles of in vitro fertilization under the direction of Dr. Odette Fraction at [facility].  She has had two term pregnancies early in her life and while she was undergoing in vitro fertilization had an ectopic pregnancy and one miscarriage.  The patient also notes that she has had a Streptococcus B infection of the cervix which has not cleared with antibiotic use.  Finally, she has fibrocystic breast disease.  She does not use caffeine and her breasts feel much better.  She has had annual mammograms and has had one breast cyst aspirated in the past.  PAST MEDICAL ILLNESSES:  Hypothyroidism.  PAST SURGERY:  Ectopic, D&C, bilateral tubal ligation, reversal of bilateral tubal ligation at [facility],  tonsils and adenoidectomy, sinus surgery, rotator cuff repair of the shoulder.  CURRENT MEDICATIONS:  Synthroid 0.75.  DRUG ALLERGIES:  None.  REVIEW OF SYSTEMS:  Essentially negative.  She has regular cyclic menses.  She was given a course of birth control pills recently, but after a week or so she developed hypertension and this was discontinued.  PHYSICAL EXAMINATION:  GENERAL:  Exam reveals a pleasant, healthy, white female in no acute distress. Height 5 feet 5 inches.  Weight 141 pounds.  HEENT:  Negative.  NECK:  Supple without thyromegaly.  There is no supraclavicular, axillary, or inguinal adenopathy.  BREASTS:  Without masses, discharges, or skin changes.  She has minimal fibrocystic changes.  ABDOMEN:  Soft and nontender.  No mass, organomegaly, ascites, or hernias or noted.  PELVIC:  EGBUS was normal.  The vagina is clean.  Cervix is large, parous, and no gross lesions are noted.  Uterus is anterior, normal shape, size, and consistency.  There is no adnexal masses noted.  PROCEDURES:  Colposcopy examination of the cervix is performed.  There are two areas in the upper portion of the cervix that have some faint, white epithelium consistent with low grade dysplasia.  No other lesions are noted.  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  This patient has previously had biopsies, multiple Pap smears and cryotherapy procedure a year ago.  RECOMMENDATIONS:  I had a lengthy discussion with the patient regarding the natural history of CIN 1 and HPV.  She has been made aware that we do not have any vaccine or antiviral agent that can eradicate the HPV infection.  I indicated that there are many treatment options available including observation.  Certainly a LEEP procedure would be reasonable, as would laser vaporization of the cervix, or repeat cryotherapy.  On the other hand, observation with Pap smears at 23-month intervals would seem  perfectly reasonable and that we would reserve any further intervention until at such time she had worsening of her Pap smear reports.  I indicated that this was unlikely to progress to any other higher grade lesion.  Other issues that were discussed with the patient include her fibrocystic breast disease.  She is encouraged to continue to have annual mammograms.  The group B beta streptococcus infection of her cervix.  I suggested that she seek consultation with infectious disease specialist regarding the proper antibiotic treatment for this.  The issue of the former use of fertility drugs and the association with increased of ovarian cancer is discussed.  At this juncture I indicated there was no definitive answer one way or the other and that annual pelvic examinations seemed our best mode of detecting ovarian cancer.  I do not think that she is at high enough risk to warrant annual ultrasounds.  All the patients questions are answered and she will return to the care of Dr. Teodora Medici. DD:  01/30/00 TD:  01/30/00 Job: [id number]

## 2011-04-02 ENCOUNTER — Encounter: Payer: Self-pay | Admitting: Internal Medicine

## 2011-04-02 ENCOUNTER — Ambulatory Visit (INDEPENDENT_AMBULATORY_CARE_PROVIDER_SITE_OTHER): Payer: 59 | Admitting: Internal Medicine

## 2011-04-02 DIAGNOSIS — F320 Major depressive disorder, single episode, mild: Secondary | ICD-10-CM

## 2011-04-02 DIAGNOSIS — E039 Hypothyroidism, unspecified: Secondary | ICD-10-CM

## 2011-04-02 DIAGNOSIS — F329 Major depressive disorder, single episode, unspecified: Secondary | ICD-10-CM

## 2011-04-02 DIAGNOSIS — R071 Chest pain on breathing: Secondary | ICD-10-CM

## 2011-04-02 DIAGNOSIS — R0789 Other chest pain: Secondary | ICD-10-CM

## 2011-04-02 NOTE — Assessment & Plan Note (Signed)
Lab Results  Component Value Date   TSH 0.62 01/02/2011   The current medical regimen is effective;  continue present plan and medications.

## 2011-04-02 NOTE — Patient Instructions (Signed)
It was good to see you today. Medications reviewed, no changes at this time. Talk with Dr. Chevis Pretty about doing right breast ultrasound in addition to mammogram this year due to tender spot Please schedule followup in November to recheck thyroid and cholesterol - call sooner if problems.

## 2011-04-02 NOTE — Progress Notes (Signed)
Subjective:    Patient ID: Lindsey Ferrell, female    DOB: Nov 02, 1955, 56 y.o.   MRN: [id number]  HPI   Here for follow up - reviewed chronic med issues:  Depression, mild - manifest as fatigue 01/2011 -  associated with loss of interest in previously enjoyable activities, excess physical exhaustion, sleeping a lot No tearfulness - no weight loss, +weight gain Started wellbutrin 01/2011 - symptoms improved - less irritability and decreased appetite (ongoing reversal of weight gain)  dyslipidemia- stopped crestor 12/2009 due to myalgias Then resumed med tx 06/2010 - stopped agan 10/2010 due to myalgia symptoms  reports prev with nonsp elevation of CPK ?2008- verified with repeat labs, then normalized  hypothyroid - reports compliance with ongoing medical treatment and no changes in medication dose or frequency. denies adverse side effects related to current therapy. no skin/hair change  GERD - no regular NSAID use, no hx ulcers  Also complains of continued right chest "heaviness" Describes as need to take "deep breath and expand my chest" Localized tenderness over R lateral breast - but no mass or redness or change symptoms ongoing for >5 years - prior eval dates back 2007 in chart review:  CT chest 6/07 and MRI T-spine 02/2007 without abnormality to explain pain  Past Medical History  Diagnosis Date  . Deficiency of other vitamins   . Sickle-cell trait   . Abnormal glandular Papanicolaou smear of cervix   . ALLERGIC RHINITIS, SEASONAL   . ASTHMA   . Melanoma     (R) thigh  . HYPERTENSION   . HYPERLIPIDEMIA, BORDERLINE   . GERD   . THYROID NODULE, RIGHT   . Unspecified hypothyroidism     Review of Systems  Constitutional: Negative for fever.  Respiratory: Negative for cough and wheezing.   Cardiovascular: Negative for chest pain.  Musculoskeletal: Negative for joint swelling.  Neurological: Negative for headaches.  also see HPI above     Objective:   Physical  Exam  BP 110/82  Pulse 65  Temp(Src) 98.3 F (36.8 C) (Oral)  Ht 5\' 5"  (1.651 m)  Wt 154 lb 12.8 oz (70.217 kg)  BMI 25.76 kg/m2  SpO2 95%  Constitutional: She is oriented to person, place, and time. She appears well-developed and well-nourished. No distress.  Neck: Normal range of motion. Neck supple. No JVD present. No thyromegaly present.  Chest wall - no reproducible tenderness, breast exam deferred to gyn Cardiovascular: Normal rate, regular rhythm and normal heart sounds.  No murmur heard. Pulmonary/Chest: Effort normal and breath sounds normal. No respiratory distress. She has no wheezes.  Psychiatric: She has a normal mood and affect. Her behavior is normal. Judgment and thought content normal.   Lab Results  Component Value Date   WBC 5.0 01/02/2011   HGB 13.6 01/02/2011   HCT 39.8 01/02/2011   PLT 204.0 01/02/2011   CHOL 213* 01/02/2011   TRIG 79.0 01/02/2011   HDL 49.30 01/02/2011   LDLDIRECT 150.0 01/02/2011   ALT 18 11/20/2009   AST 15 11/20/2009   NA 142 11/20/2009   K 4.1 11/20/2009   CL 109 11/20/2009   CREATININE 0.6 11/20/2009   BUN 15 11/20/2009   CO2 30 11/20/2009   TSH 0.62 01/02/2011   Wt Readings from Last 3 Encounters:  04/02/11 154 lb 12.8 oz (70.217 kg)  01/01/11 162 lb 1.9 oz (73.537 kg)  10/08/10 161 lb (73.029 kg)       Assessment & Plan:  See problem list. Medications and  labs reviewed today.  R chest wall "heaviness" - chronic symptoms predating chole and negative prior eval - intermittent and not worse, not present daily Reassurance provided re: exam and prior labs/tests - rec f/u with gyn on mammo and consider R breast US over tender area this summer/fall   Time spent with pt today 25 minutes, greater than 50% time spent counseling patient on chest symptoms, depression and medication review. Also review of prior records

## 2011-04-02 NOTE — Assessment & Plan Note (Signed)
Labs 01/2011 unremarkable -  started on low dose wellbutrin for same and feels improved: less irritability - Will continue same at this time, no change

## 2011-04-08 ENCOUNTER — Other Ambulatory Visit: Payer: Self-pay | Admitting: Internal Medicine

## 2011-05-13 ENCOUNTER — Encounter: Payer: Self-pay | Admitting: Internal Medicine

## 2011-05-17 ENCOUNTER — Encounter: Payer: Self-pay | Admitting: Internal Medicine

## 2011-05-21 ENCOUNTER — Encounter: Payer: Self-pay | Admitting: Endocrinology

## 2011-05-21 ENCOUNTER — Ambulatory Visit (INDEPENDENT_AMBULATORY_CARE_PROVIDER_SITE_OTHER): Payer: 59 | Admitting: Endocrinology

## 2011-05-21 VITALS — BP 106/72 | HR 78 | Temp 98.4°F | Ht 65.5 in | Wt 156.2 lb

## 2011-05-21 DIAGNOSIS — J069 Acute upper respiratory infection, unspecified: Secondary | ICD-10-CM

## 2011-05-21 MED ORDER — CEFUROXIME AXETIL 500 MG PO TABS: 500.0000 mg | ORAL_TABLET | Freq: Two times a day (BID) | ORAL | Status: AC

## 2011-05-21 NOTE — Progress Notes (Signed)
  Subjective:    Patient ID: Lindsey Ferrell, female    DOB: Oct 09, 1955, 55 y.o.   MRN: [id number]  HPI Pt states 10 days of moderate to severe congestion, worst at the right maxillary area, and assoc purulent rhinorrhea.   Past Medical History  Diagnosis Date  . Deficiency of other vitamins   . Sickle-cell trait   . Abnormal glandular Papanicolaou smear of cervix   . ALLERGIC RHINITIS, SEASONAL   . ASTHMA   . Melanoma     (R) thigh  . HYPERTENSION   . HYPERLIPIDEMIA, BORDERLINE   . GERD   . THYROID NODULE, RIGHT   . Unspecified hypothyroidism     Past Surgical History  Procedure Date  . Tonsillectomy     childhood  . Nasal sinus surgery   . Melanoma in situ excision 2010    (R) thigh   . Cholecystectomy 04/2005    History   Social History  . Marital Status: Married    Spouse Name: N/A    Number of Children: N/A  . Years of Education: N/A   Occupational History  . Not on file.   Social History Main Topics  . Smoking status: Never Smoker   . Smokeless tobacco: Not on file   Comment: Married, 2 grown dtr with g-kids plus 2 adopted children, living at home  . Alcohol Use: Yes     Occassional  . Drug Use: No  . Sexually Active:    Other Topics Concern  . Not on file   Social History Narrative  . No narrative on file    Current Outpatient Prescriptions on File Prior to Visit  Medication Sig Dispense Refill  . bifidobacterium infantis (ALIGN) capsule Take 1 capsule by mouth daily.  30 capsule  0  . levothyroxine (SYNTHROID, LEVOTHROID) 137 MCG tablet Take 137 mcg by mouth daily.        . mometasone (NASONEX) 50 MCG/ACT nasal spray 2 sprays by Nasal route daily as needed.        Marland Kitchen buPROPion (WELLBUTRIN XL) 150 MG 24 hr tablet TAKE 1 TABLET BY MOUTH EVERY MORNING  30 tablet  5   No Known Allergies  Family History  Problem Relation Age of Onset  . Arthritis Mother   . Arthritis Father   . Breast cancer Sister   . Diabetes Maternal Uncle   .  Hypertension Other     parent & grandparent  . Heart disease Other     parent & grandparent    BP 106/72  Pulse 78  Temp(Src) 98.4 F (36.9 C) (Oral)  Ht 5' 5.5" (1.664 m)  Wt 156 lb 3.2 oz (70.852 kg)  BMI 25.60 kg/m2  SpO2 95%  Review of Systems Denies fever    Objective:   Physical Exam VITAL SIGNS:  See vs page GENERAL: no distress head: no deformity eyes: no periorbital swelling, no proptosis external nose and ears are normal mouth: no lesion seen Ears: both tm's are slightly red and congested.         Assessment & Plan:  Glenford Peers, new

## 2011-05-21 NOTE — Patient Instructions (Addendum)
i have sent a prescription to your pharmacy, for an antibiotic. Loratadine-d (non-prescription) will help your congestion. I hope you feel better soon.  If you don't feel better by next week, please call doctor leschber.

## 2011-07-04 ENCOUNTER — Ambulatory Visit: Payer: 59 | Admitting: Internal Medicine

## 2011-10-03 ENCOUNTER — Other Ambulatory Visit: Payer: Self-pay | Admitting: Internal Medicine

## 2011-10-11 ENCOUNTER — Ambulatory Visit (INDEPENDENT_AMBULATORY_CARE_PROVIDER_SITE_OTHER): Payer: 59 | Admitting: Internal Medicine

## 2011-10-11 ENCOUNTER — Other Ambulatory Visit (INDEPENDENT_AMBULATORY_CARE_PROVIDER_SITE_OTHER): Payer: 59

## 2011-10-11 ENCOUNTER — Encounter: Payer: Self-pay | Admitting: Internal Medicine

## 2011-10-11 VITALS — BP 110/80 | HR 66 | Temp 98.3°F | Ht 65.5 in | Wt 166.2 lb

## 2011-10-11 DIAGNOSIS — F320 Major depressive disorder, single episode, mild: Secondary | ICD-10-CM

## 2011-10-11 DIAGNOSIS — E785 Hyperlipidemia, unspecified: Secondary | ICD-10-CM

## 2011-10-11 DIAGNOSIS — F329 Major depressive disorder, single episode, unspecified: Secondary | ICD-10-CM

## 2011-10-11 DIAGNOSIS — E039 Hypothyroidism, unspecified: Secondary | ICD-10-CM

## 2011-10-11 LAB — TSH: TSH: 1.02 u[IU]/mL (ref 0.35–5.50)

## 2011-10-11 LAB — LIPID PANEL
Total CHOL/HDL Ratio: 3
Triglycerides: 48 mg/dL (ref 0.0–149.0)

## 2011-10-11 MED ORDER — CITALOPRAM HYDROBROMIDE 10 MG PO TABS: 10.0000 mg | ORAL_TABLET | Freq: Every day | ORAL | Status: DC

## 2011-10-11 NOTE — Progress Notes (Signed)
Subjective:    Patient ID: Lindsey Ferrell, female    DOB: [birth year], 56 y.o.   MRN: [id number]  HPI Here for follow up - reviewed chronic medical issues:  Depression, mild - initially manifest as fatigue 01/2011 -  associated with loss of interest in previously enjoyable activities, excessive physical exhaustion, hypersomnia, and irritability. Denies tearfulness - no weight loss, +weight gain Started wellbutrin 01/2011 - symptoms improved initially - less irritability and decreased appetite (ongoing reversal of weight gain) - but stopped working 05/2011 so stopped same ?other med  dyslipidemia- stopped crestor 12/2009 due to myalgias Then resumed med tx 06/2010 - stopped agan 10/2010 due to myalgia symptoms  reports prev with nonsp elevation of CPK ?2008- verified with repeat labs, then normalized  hypothyroid - reports compliance with ongoing medical treatment and no changes in medication dose or frequency. denies adverse side effects related to current therapy. no skin/hair change, weight changes reviewed  ?GERD - no regular NSAID use, no hx ulcers - EGD 07/2010 unremarkable - overlap with IBS symptoms - improved with TUMS, ?other med   Past Medical History  Diagnosis Date  . Deficiency of other vitamins   . Sickle-cell trait   . Abnormal glandular Papanicolaou smear of cervix   . ALLERGIC RHINITIS, SEASONAL   . ASTHMA   . Melanoma     (R) thigh  . HYPERTENSION   . HYPERLIPIDEMIA, BORDERLINE   . GERD   . THYROID NODULE, RIGHT   . Unspecified hypothyroidism     Review of Systems  Constitutional: Positive for fatigue. Negative for fever, activity change and appetite change. Unexpected weight change: weight gain.  Respiratory: Negative for cough and shortness of breath.   Cardiovascular: Negative for chest pain and palpitations.  Gastrointestinal: Negative for nausea, vomiting, abdominal pain, diarrhea, constipation and blood in stool. Abdominal distention: bloating.    Psychiatric/Behavioral: Positive for dysphoric mood (irritability). Negative for hallucinations. The patient is not nervous/anxious.        Objective:   Physical Exam BP 110/80  Pulse 66  Temp(Src) 98.3 F (36.8 C) (Oral)  Ht 5' 5.5" (1.664 m)  Wt 166 lb 3.2 oz (75.388 kg)  BMI 27.24 kg/m2  SpO2 98% Wt Readings from Last 3 Encounters:  10/11/11 166 lb 3.2 oz (75.388 kg)  05/21/11 156 lb 3.2 oz (70.852 kg)  04/02/11 154 lb 12.8 oz (70.217 kg)   Constitutional: She appears well-developed and well-nourished. No distress.  Neck: Normal range of motion. Neck supple. No JVD present. No thyromegaly present.  Cardiovascular: Normal rate, regular rhythm and normal heart sounds.  No murmur heard. No BLE edema. Pulmonary/Chest: Effort normal and breath sounds normal. No respiratory distress. She has no wheezes.  Abdominal: Soft. Bowel sounds are normal. She exhibits no distension. There is no tenderness. no masses Psychiatric: She has a dysphoric mood and affect. Her behavior is normal. Judgment and thought content normal.   Lab Results  Component Value Date   WBC 5.0 01/02/2011   HGB 13.6 01/02/2011   HCT 39.8 01/02/2011   PLT 204.0 01/02/2011   GLUCOSE 91 11/20/2009   CHOL 213* 01/02/2011   TRIG 79.0 01/02/2011   HDL 49.30 01/02/2011   LDLDIRECT 150.0 01/02/2011   LDLCALC 129* 05/18/2010   ALT 18 11/20/2009   AST 15 11/20/2009   NA 142 11/20/2009   K 4.1 11/20/2009   CL 109 11/20/2009   CREATININE 0.6 11/20/2009   BUN 15 11/20/2009   CO2 30 11/20/2009   TSH 0.62  01/02/2011   EGD 07/2010: normal     Assessment & Plan:  See problem list. Medications and labs reviewed today.

## 2011-10-11 NOTE — Assessment & Plan Note (Signed)
Labs 01/2011 unremarkable -  started on low dose wellbutrin for same 01/2011> felt improved: less irritability - but "stopped working" and stopped med 06/2011 Try new med - citalopram - erx done, follow up 4-6 weeks, sooner if worse May help IBS symptoms as well

## 2011-10-11 NOTE — Assessment & Plan Note (Signed)
Unable to tolerate simva and crestor trials in past due to myalgias/increas CK Encouraged attention to diet and exercise with weight control Check lipids now (when fasting)

## 2011-10-11 NOTE — Assessment & Plan Note (Signed)
Lab Results  Component Value Date   TSH 0.62 01/02/2011  recheck level now with depression symptoms and weight gain The current medical regimen is effective;  continue present plan and medications.

## 2011-10-11 NOTE — Patient Instructions (Signed)
It was good to see you today Start low dose citalopram (generic celexa) - Your prescription(s) have been submitted to your pharmacy. Please take as directed and contact our office if you believe you are having problem(s) with the medication(s). Test(s) ordered today. Your results will be called to you after review (48-72hours after test completion). If any changes need to be made, you will be notified at that time. Please schedule followup in 4-6 weeks to review meds and symptoms, call sooner if problems.

## 2011-10-28 ENCOUNTER — Other Ambulatory Visit: Payer: Self-pay | Admitting: Internal Medicine

## 2011-12-24 ENCOUNTER — Telehealth: Payer: Self-pay | Admitting: Internal Medicine

## 2011-12-24 MED ORDER — AMOXICILLIN-POT CLAVULANATE 875-125 MG PO TABS: 1.0000 | ORAL_TABLET | Freq: Two times a day (BID) | ORAL | Status: AC

## 2011-12-24 NOTE — Telephone Encounter (Signed)
Pt left msg on vm stating having her annual sinus infection x's 2 weeks. Started out as allergies, but head is very tight, sinus congestion, headache, earache, eyes are puffy. Was told by front office no appt was available. Wanting to ask if md would rx something .Marland KitchenMarland Kitchen4/23/13@11 :54am/LMB

## 2011-12-24 NOTE — Telephone Encounter (Signed)
Notified pt with md response...12/24/11@1 :32pm/LMB

## 2011-12-24 NOTE — Telephone Encounter (Signed)
Augmentin antibiotics 2x/d x 10d as before - erx done - pt to call if worse or unimporved

## 2011-12-24 NOTE — Telephone Encounter (Signed)
The pt called and was hoping to be seen for a possible sinus infection.  I advised the patient there were no regular openings, but i would be happy to find someone else to see her or find a different day.  She then asked to speak to the nurse.  I asked her if I could put a phone note in on her behalf, but she stated she wanted to speak directly to the nurse to ask for an antibiotic without being seen.

## 2012-04-29 ENCOUNTER — Encounter: Payer: Self-pay | Admitting: Internal Medicine

## 2012-04-29 ENCOUNTER — Ambulatory Visit (INDEPENDENT_AMBULATORY_CARE_PROVIDER_SITE_OTHER): Payer: 59 | Admitting: Internal Medicine

## 2012-04-29 ENCOUNTER — Encounter: Payer: Self-pay | Admitting: Gastroenterology

## 2012-04-29 VITALS — BP 110/80 | HR 67 | Temp 97.9°F | Ht 65.5 in | Wt 164.0 lb

## 2012-04-29 DIAGNOSIS — E041 Nontoxic single thyroid nodule: Secondary | ICD-10-CM

## 2012-04-29 DIAGNOSIS — Z0000 Encounter for general adult medical examination without abnormal findings: Secondary | ICD-10-CM

## 2012-04-29 DIAGNOSIS — I208 Other forms of angina pectoris: Secondary | ICD-10-CM

## 2012-04-29 DIAGNOSIS — Z23 Encounter for immunization: Secondary | ICD-10-CM

## 2012-04-29 DIAGNOSIS — I209 Angina pectoris, unspecified: Secondary | ICD-10-CM

## 2012-04-29 DIAGNOSIS — R198 Other specified symptoms and signs involving the digestive system and abdomen: Secondary | ICD-10-CM

## 2012-04-29 DIAGNOSIS — E039 Hypothyroidism, unspecified: Secondary | ICD-10-CM

## 2012-04-29 NOTE — Patient Instructions (Signed)
It was good to see you today. Health Maintenance reviewed - tetanus updated today - all other recommended immunizations and age-appropriate screenings are up-to-date. Test(s) ordered today - return when you are fasting. Your results will be called to you after review (48-72hours after test completion). If any changes need to be made, you will be notified at that time. we'll make referral to Dr Arlyce Dice for change in bowels, thyroid ultrasound and for cardiac stress test . Our office will contact you regarding appointment(s) once made. Medications reviewed and updated, no changes at this time.  Please schedule followup in 6-12 months, call sooner if problems.    Health Maintenance, Females A healthy lifestyle and preventative care can promote health and wellness.  Maintain regular health, dental, and eye exams.   Eat a healthy diet. Foods like vegetables, fruits, whole grains, low-fat dairy products, and lean protein foods contain the nutrients you need without too many calories. Decrease your intake of foods high in solid fats, added sugars, and salt. Get information about a proper diet from your caregiver, if necessary.   Regular physical exercise is one of the most important things you can do for your health. Most adults should get at least 150 minutes of moderate-intensity exercise (any activity that increases your heart rate and causes you to sweat) each week. In addition, most adults need muscle-strengthening exercises on 2 or more days a week.     Maintain a healthy weight. The body mass index (BMI) is a screening tool to identify possible weight problems. It provides an estimate of body fat based on height and weight. Your caregiver can help determine your BMI, and can help you achieve or maintain a healthy weight. For adults 20 years and older:   A BMI below 18.5 is considered underweight.   A BMI of 18.5 to 24.9 is normal.   A BMI of 25 to 29.9 is considered overweight.   A BMI of 30  and above is considered obese.   Maintain normal blood lipids and cholesterol by exercising and minimizing your intake of saturated fat. Eat a balanced diet with plenty of fruits and vegetables. Blood tests for lipids and cholesterol should begin at age 62 and be repeated every 5 years. If your lipid or cholesterol levels are high, you are over 50, or you are a high risk for heart disease, you may need your cholesterol levels checked more frequently. Ongoing high lipid and cholesterol levels should be treated with medicines if diet and exercise are not effective.   If you smoke, find out from your caregiver how to quit. If you do not use tobacco, do not start.   If you are pregnant, do not drink alcohol. If you are breastfeeding, be very cautious about drinking alcohol. If you are not pregnant and choose to drink alcohol, do not exceed 1 drink per day. One drink is considered to be 12 ounces (355 mL) of beer, 5 ounces (148 mL) of wine, or 1.5 ounces (44 mL) of liquor.   Avoid use of street drugs. Do not share needles with anyone. Ask for help if you need support or instructions about stopping the use of drugs.   High blood pressure causes heart disease and increases the risk of stroke. Blood pressure should be checked at least every 1 to 2 years. Ongoing high blood pressure should be treated with medicines, if weight loss and exercise are not effective.   If you are 19 to 58 years old, ask your caregiver  if you should take aspirin to prevent strokes.   Diabetes screening involves taking a blood sample to check your fasting blood sugar level. This should be done once every 3 years, after age 9, if you are within normal weight and without risk factors for diabetes. Testing should be considered at a younger age or be carried out more frequently if you are overweight and have at least 1 risk factor for diabetes.   Breast cancer screening is essential preventative care for women. You should practice  "breast self-awareness." This means understanding the normal appearance and feel of your breasts and may include breast self-examination. Any changes detected, no matter how small, should be reported to a caregiver. Women in their 19s and 30s should have a clinical breast exam (CBE) by a caregiver as part of a regular health exam every 1 to 3 years. After age 17, women should have a CBE every year. Starting at age 74, women should consider having a mammogram (breast X-ray) every year. Women who have a family history of breast cancer should talk to their caregiver about genetic screening. Women at a high risk of breast cancer should talk to their caregiver about having an MRI and a mammogram every year.   The Pap test is a screening test for cervical cancer. Women should have a Pap test starting at age 42. Between ages 41 and 56, Pap tests should be repeated every 2 years. Beginning at age 78, you should have a Pap test every 3 years as long as the past 3 Pap tests have been normal. If you had a hysterectomy for a problem that was not cancer or a condition that could lead to cancer, then you no longer need Pap tests. If you are between ages 9 and 58, and you have had normal Pap tests going back 10 years, you no longer need Pap tests. If you have had past treatment for cervical cancer or a condition that could lead to cancer, you need Pap tests and screening for cancer for at least 20 years after your treatment. If Pap tests have been discontinued, risk factors (such as a new sexual partner) need to be reassessed to determine if screening should be resumed. Some women have medical problems that increase the chance of getting cervical cancer. In these cases, your caregiver may recommend more frequent screening and Pap tests.   The human papillomavirus (HPV) test is an additional test that may be used for cervical cancer screening. The HPV test looks for the virus that can cause the cell changes on the cervix. The  cells collected during the Pap test can be tested for HPV. The HPV test could be used to screen women aged 42 years and older, and should be used in women of any age who have unclear Pap test results. After the age of 65, women should have HPV testing at the same frequency as a Pap test.   Colorectal cancer can be detected and often prevented. Most routine colorectal cancer screening begins at the age of 110 and continues through age 47. However, your caregiver may recommend screening at an earlier age if you have risk factors for colon cancer. On a yearly basis, your caregiver may provide home test kits to check for hidden blood in the stool. Use of a small camera at the end of a tube, to directly examine the colon (sigmoidoscopy or colonoscopy), can detect the earliest forms of colorectal cancer. Talk to your caregiver about this at age 75,  when routine screening begins. Direct examination of the colon should be repeated every 5 to 10 years through age 90, unless early forms of pre-cancerous polyps or small growths are found.   Hepatitis C blood testing is recommended for all people born from [birth year] through 1967 and any individual with known risks for hepatitis C.   Practice safe sex. Use condoms and avoid high-risk sexual practices to reduce the spread of sexually transmitted infections (STIs). Sexually active women aged 28 and younger should be checked for Chlamydia, which is a common sexually transmitted infection. Older women with new or multiple partners should also be tested for Chlamydia. Testing for other STIs is recommended if you are sexually active and at increased risk.   Osteoporosis is a disease in which the bones lose minerals and strength with aging. This can result in serious bone fractures. The risk of osteoporosis can be identified using a bone density scan. Women ages 66 and over and women at risk for fractures or osteoporosis should discuss screening with their caregivers. Ask your  caregiver whether you should be taking a calcium supplement or vitamin D to reduce the rate of osteoporosis.   Menopause can be associated with physical symptoms and risks. Hormone replacement therapy is available to decrease symptoms and risks. You should talk to your caregiver about whether hormone replacement therapy is right for you.   Use sunscreen with a sun protection factor (SPF) of 30 or greater. Apply sunscreen liberally and repeatedly throughout the day. You should seek shade when your shadow is shorter than you. Protect yourself by wearing long sleeves, pants, a wide-brimmed hat, and sunglasses year round, whenever you are outdoors.   Notify your caregiver of new moles or changes in moles, especially if there is a change in shape or color. Also notify your caregiver if a mole is larger than the size of a pencil eraser.   Stay current with your immunizations.  Document Released: 03/04/2011 Document Revised: 08/08/2011 Document Reviewed: 03/04/2011 [facility] Patient Information 2012 [place], [place].

## 2012-04-29 NOTE — Progress Notes (Signed)
Subjective:    Patient ID: Lindsey Ferrell, female    DOB: [birth year], 56 y.o.   MRN: [id number]  HPI  patient is here today for annual physical. Patient also has several concerns:  complains of diarrhea and abdominal pain - associated with fecal urgency (3-4 BM/day), abdominal cramping Onset > 6 weeks ago (suddent onset in [place] 4th weekend).  Exacerbated by any food intake  complains of chest tightness and dyspnea on exertion - No radiation of pain but concerned for cardiac issue given FH  also reviewed chronic medical issues:  dyslipidemia- stopped crestor 12/2009 due to myalgias Then resumed med tx 06/2010 - 10/2010 - stopped due to myalgia symptoms  hx nonsp elevation of CPK 2008 on statin, verified with repeat labs, then normalized off tx  hypothyroid - reports compliance with ongoing medical treatment and no changes in medication dose or frequency. denies adverse side effects related to current therapy. no skin/hair change, weight changes reviewed  GERD - no regular NSAID use, no hx ulcers - EGD 07/2010 unremarkable - overlap with IBS symptoms - improved with TUMS   Past Medical History  Diagnosis Date  . Deficiency of other vitamins   . Sickle-cell trait   . Abnormal glandular Papanicolaou smear of cervix   . ALLERGIC RHINITIS, SEASONAL   . ASTHMA   . Melanoma     (R) thigh  . HYPERTENSION   . HYPERLIPIDEMIA, BORDERLINE   . GERD   . THYROID NODULE, RIGHT   . Unspecified hypothyroidism    Family History  Problem Relation Age of Onset  . Arthritis Mother   . Arthritis Father   . Breast cancer Sister   . Diabetes Maternal Uncle   . Hypertension Other     parent & grandparent  . Heart disease Other     parent & grandparent   History  Substance Use Topics  . Smoking status: Never Smoker   . Smokeless tobacco: Not on file   Comment: Married, 2 grown dtr with g-kids plus 2 adopted children, living at home  . Alcohol Use: Yes     Occassional     Review of Systems  Constitutional: Positive for fatigue. Negative for fever, activity change and appetite change.  Respiratory: Negative for cough and shortness of breath.   Cardiovascular: Negative for chest pain and palpitations.  Gastrointestinal: Positive for abdominal pain, diarrhea and abdominal distention (bloating ). Negative for nausea, vomiting, constipation and blood in stool.  Psychiatric/Behavioral: Negative for hallucinations and dysphoric mood. The patient is not nervous/anxious.   No other specific complaints in a complete review of systems (except as listed in HPI above).      Objective:   Physical Exam  BP 110/80  Pulse 67  Temp 97.9 F (36.6 C) (Oral)  Ht 5' 5.5" (1.664 m)  Wt 164 lb (74.39 kg)  BMI 26.88 kg/m2  SpO2 96% Wt Readings from Last 3 Encounters:  04/29/12 164 lb (74.39 kg)  10/11/11 166 lb 3.2 oz (75.388 kg)  05/21/11 156 lb 3.2 oz (70.852 kg)   Constitutional: She appears well-developed and well-nourished. No distress.  HENT: Head: Normocephalic and atraumatic. Ears: B TMs ok, no erythema or effusion; Nose: Nose normal.  Mouth/Throat: Oropharynx is clear and moist. No oropharyngeal exudate.  Eyes: Conjunctivae and EOM are normal. Pupils are equal, round, and reactive to light. No scleral icterus.  Neck: Normal range of motion. Neck supple. No JVD present. No thyromegaly present.  Cardiovascular: Normal rate, regular rhythm and normal  heart sounds.  No murmur heard. No BLE edema. Pulmonary/Chest: Effort normal and breath sounds normal. No respiratory distress. She has no wheezes.  Abdominal: Soft. Bowel sounds are normal. She exhibits no distension. There is no tenderness. no masses Musculoskeletal: Normal range of motion, no joint effusions. No gross deformities Neurological: She is alert and oriented to person, place, and time. No cranial nerve deficit. Coordination normal.  Skin: Skin is warm and dry. No rash noted. No erythema.   Psychiatric: She has a normal mood and affect. Her behavior is normal. Judgment and thought content normal.    Lab Results  Component Value Date   WBC 5.0 01/02/2011   HGB 13.6 01/02/2011   HCT 39.8 01/02/2011   PLT 204.0 01/02/2011   GLUCOSE 91 11/20/2009   CHOL 172 10/11/2011   TRIG 48.0 10/11/2011   HDL 56.50 10/11/2011   LDLDIRECT 150.0 01/02/2011   LDLCALC 106* 10/11/2011   ALT 18 11/20/2009   AST 15 11/20/2009   NA 142 11/20/2009   K 4.1 11/20/2009   CL 109 11/20/2009   CREATININE 0.6 11/20/2009   BUN 15 11/20/2009   CO2 30 11/20/2009   TSH 1.02 10/11/2011   EGD 07/2010: normal  ECG: sinus @ 60 bpm - no ischemic changes      Assessment & Plan:  CPX/v70.0- Patient has been counseled on age-appropriate routine health concerns for screening and prevention. These are reviewed and up-to-date. Immunizations are up-to-date or declined. Labs and ECG reviewed.  Change in bowels, ongoing symptoms > 6 weeks - hx IBS but new fecal urgency and diarrhea - check O&P given travel to [place] 03/2012 and refer to GI  ?atypical angina - paroxysmal chest tightness - refer for stress test given other CRF  Also see problem list. Medications and labs reviewed today.

## 2012-04-29 NOTE — Assessment & Plan Note (Signed)
Lab Results  Component Value Date   TSH 1.02 10/11/2011  recheck level now with fatigue symptoms and titrate as needed

## 2012-04-29 NOTE — Assessment & Plan Note (Addendum)
[name] 03/2006 reviewed -  Slightly smaller on 05/2012 follow up : Right thyroid lobe: Measures 6.5 x 1.9 x 1.3 cm (previously 7.2 x 2.5 x 1.7 cm), heterogeneous in echotexture. Left thyroid lobe: Measures 4.6 x 1.2 x 1.0 cm (previously 5.7 x 1.5 x 1.2 cm), heterogeneous in echotexture. Isthmus: Measures 2 mm.  Refer to endo for biopsy

## 2012-04-30 ENCOUNTER — Other Ambulatory Visit (INDEPENDENT_AMBULATORY_CARE_PROVIDER_SITE_OTHER): Payer: 59

## 2012-04-30 DIAGNOSIS — Z0000 Encounter for general adult medical examination without abnormal findings: Secondary | ICD-10-CM

## 2012-04-30 DIAGNOSIS — R198 Other specified symptoms and signs involving the digestive system and abdomen: Secondary | ICD-10-CM

## 2012-04-30 LAB — URINALYSIS, ROUTINE W REFLEX MICROSCOPIC
Leukocytes, UA: NEGATIVE
Specific Gravity, Urine: 1.02 (ref 1.000–1.030)
Urine Glucose: NEGATIVE
Urobilinogen, UA: 0.2 (ref 0.0–1.0)

## 2012-04-30 LAB — HEPATIC FUNCTION PANEL
AST: 18 U/L (ref 0–37)
Albumin: 3.9 g/dL (ref 3.5–5.2)
Alkaline Phosphatase: 79 U/L (ref 39–117)
Bilirubin, Direct: 0.1 mg/dL (ref 0.0–0.3)

## 2012-04-30 LAB — CBC WITH DIFFERENTIAL/PLATELET
Basophils Absolute: 0.1 10*3/uL (ref 0.0–0.1)
Basophils Relative: 1.1 % (ref 0.0–3.0)
Eosinophils Absolute: 0.3 10*3/uL (ref 0.0–0.7)
Lymphocytes Relative: 22 % (ref 12.0–46.0)
MCHC: 33.3 g/dL (ref 30.0–36.0)
MCV: 86.9 fl (ref 78.0–100.0)
Monocytes Absolute: 0.6 10*3/uL (ref 0.1–1.0)
Neutrophils Relative %: 62.7 % (ref 43.0–77.0)
Platelets: 195 10*3/uL (ref 150.0–400.0)
RDW: 13.6 % (ref 11.5–14.6)

## 2012-04-30 LAB — LIPID PANEL
LDL Cholesterol: 128 mg/dL — ABNORMAL HIGH (ref 0–99)
Total CHOL/HDL Ratio: 4
VLDL: 19.2 mg/dL (ref 0.0–40.0)

## 2012-04-30 LAB — BASIC METABOLIC PANEL
BUN: 18 mg/dL (ref 6–23)
CO2: 27 mEq/L (ref 19–32)
Calcium: 9.5 mg/dL (ref 8.4–10.5)
Chloride: 104 mEq/L (ref 96–112)
Creatinine, Ser: 0.6 mg/dL (ref 0.4–1.2)
Glucose, Bld: 89 mg/dL (ref 70–99)

## 2012-05-01 ENCOUNTER — Ambulatory Visit (INDEPENDENT_AMBULATORY_CARE_PROVIDER_SITE_OTHER): Payer: 59 | Admitting: Internal Medicine

## 2012-05-01 ENCOUNTER — Encounter: Payer: Self-pay | Admitting: Internal Medicine

## 2012-05-01 VITALS — BP 102/72 | HR 74 | Temp 98.6°F

## 2012-05-01 DIAGNOSIS — L700 Acne vulgaris: Secondary | ICD-10-CM

## 2012-05-01 DIAGNOSIS — L708 Other acne: Secondary | ICD-10-CM

## 2012-05-01 MED ORDER — DOXYCYCLINE HYCLATE 100 MG PO TABS: 100.0000 mg | ORAL_TABLET | Freq: Two times a day (BID) | ORAL | Status: AC

## 2012-05-01 NOTE — Patient Instructions (Addendum)
It was good to see you today. Doxycycline antibiotics 2x/day x 1 week -  Your prescription(s) have been submitted to your pharmacy. Please take as directed and contact our office if you believe you are having problem(s) with the medication(s). Gentle face cleanser until resolved - ok for sensitive skin moisturizer as neededAcne Acne is a skin problem that causes pimples. Acne occurs when the pores in your skin get blocked. Your pores may become red, sore, and swollen (inflamed), or infected with a common skin bacterium (Propionibacterium acnes). Acne is a common skin problem. Up to 80% of people get acne at some time. Acne is especially common from the ages of 35 to 8. Acne usually goes away over time with proper treatment. CAUSES   Your pores each contain an oil gland. The oil glands make an oily substance called sebum. Acne happens when these glands get plugged with sebum, dead skin cells, and dirt. The P. acnes bacteria that are normally found in the oil glands then multiply, causing inflammation. Acne is commonly triggered by changes in your hormones. These hormonal changes can cause the oil glands to get bigger and to make more sebum. Factors that can make acne worse include:  Hormone changes during adolescence.   Hormone changes during women's menstrual cycles.   Hormone changes during pregnancy.   Oil-based cosmetics and hair products.   Harshly scrubbing the skin.   Strong soaps.   Stress.   Hormone problems due to certain diseases.   Long or oily hair rubbing against the skin.   Certain medicines.   Pressure from headbands, backpacks, or shoulder pads.   Exposure to certain oils and chemicals.  SYMPTOMS   Acne often occurs on the face, neck, chest, and upper back. Symptoms include:  Small, red bumps (pimples or papules).   Whiteheads (closed comedones).   Blackheads (open comedones).   Small, pus-filled pimples (pustules).   Big, red pimples or pustules that feel  tender.  More severe acne can cause:  An infected area that contains a collection of pus (abscess).   Hard, painful, fluid-filled sacs (cysts).   Scars.  DIAGNOSIS   Your caregiver can usually tell what the problem is by doing a physical exam. TREATMENT   There are many good treatments for acne. Some are available over-the-counter and some are available with a prescription. The treatment that is best for you depends on the type of acne you have and how severe it is. It may take 2 months of treatment before your acne gets better. Common treatments include:  Creams and lotions that prevent oil glands from clogging.   Creams and lotions that treat or prevent infections and inflammation.   Antibiotics applied to the skin or taken as a pill.   Pills that decrease sebum production.   Birth control pills.   Light or laser treatments.   Minor surgery.   Injections of medicine into the affected areas.   Chemicals that cause peeling of the skin.  HOME CARE INSTRUCTIONS   Good skin care is the most important part of treatment.  Wash your skin gently at least twice a day and after exercise. Always wash your skin before bed.   Use mild soap.   After each wash, apply a water-based skin moisturizer.   Keep your hair clean and off of your face. Shampoo your hair daily.   Only take medicines as directed by your caregiver.   Use a sunscreen or sunblock with SPF 30 or greater. This is  especially important when you are using acne medicines.   Choose cosmetics that are noncomedogenic. This means they do not plug the oil glands.   Avoid leaning your chin or forehead on your hands.   Avoid wearing tight headbands or hats.   Avoid picking or squeezing your pimples. This can make your acne worse and cause scarring.  SEEK MEDICAL CARE IF:    Your acne is not better after 8 weeks.   Your acne gets worse.   You have a large area of skin that is red or tender.  Document Released:  08/16/2000 Document Revised: 08/08/2011 Document Reviewed: 06/07/2011 [facility] Patient Information 2012 [place], [place].

## 2012-05-01 NOTE — Progress Notes (Signed)
  Subjective:    Patient ID: Lindsey Ferrell, female    DOB: [birth year], 56 y.o.   MRN: [id number]  HPI  complains of facial rash Onset 3 days ago No itch, not painful - no other body affected No outdoor exposure Located B cheek bones ?precipitated by mixing facial exfoliation creams earlier this week  Past Medical History  Diagnosis Date  . Deficiency of other vitamins   . Sickle-cell trait   . Abnormal glandular Papanicolaou smear of cervix   . ALLERGIC RHINITIS, SEASONAL   . ASTHMA   . Melanoma     (R) thigh  . HYPERTENSION   . HYPERLIPIDEMIA, BORDERLINE   . GERD   . THYROID NODULE, RIGHT   . Unspecified hypothyroidism     Review of Systems No fever No generalized rash    Objective:   Physical Exam BP 102/72  Pulse 74  Temp 98.6 F (37 C) (Oral)  SpO2 96% Gen: NAD, nontoxic Skin: nodular acne B cheeks - no pustules        Assessment & Plan:   nodular acne - doxy, education Avoid aggressive exfoliation and mixing creams

## 2012-05-06 ENCOUNTER — Ambulatory Visit
Admission: RE | Admit: 2012-05-06 | Discharge: 2012-05-06 | Disposition: A | Discharge status: Home / Self Care | Payer: 59 | Source: Ambulatory Visit | Attending: Internal Medicine | Admitting: Internal Medicine

## 2012-05-06 DIAGNOSIS — E041 Nontoxic single thyroid nodule: Secondary | ICD-10-CM

## 2012-05-06 NOTE — Addendum Note (Signed)
Addended by: Rene Paci A on: 05/06/2012 11:27 AM   Modules accepted: Orders

## 2012-05-14 ENCOUNTER — Ambulatory Visit (HOSPITAL_COMMUNITY): Payer: 59 | Attending: Internal Medicine | Admitting: Radiology

## 2012-05-14 VITALS — BP 125/72 | HR 63 | Ht 65.0 in | Wt 160.0 lb

## 2012-05-14 DIAGNOSIS — R002 Palpitations: Secondary | ICD-10-CM | POA: Insufficient documentation

## 2012-05-14 DIAGNOSIS — R0609 Other forms of dyspnea: Secondary | ICD-10-CM | POA: Insufficient documentation

## 2012-05-14 DIAGNOSIS — E785 Hyperlipidemia, unspecified: Secondary | ICD-10-CM

## 2012-05-14 DIAGNOSIS — I208 Other forms of angina pectoris: Secondary | ICD-10-CM

## 2012-05-14 DIAGNOSIS — R0789 Other chest pain: Secondary | ICD-10-CM | POA: Insufficient documentation

## 2012-05-14 DIAGNOSIS — R0602 Shortness of breath: Secondary | ICD-10-CM

## 2012-05-14 DIAGNOSIS — R42 Dizziness and giddiness: Secondary | ICD-10-CM | POA: Insufficient documentation

## 2012-05-14 DIAGNOSIS — R0989 Other specified symptoms and signs involving the circulatory and respiratory systems: Secondary | ICD-10-CM | POA: Insufficient documentation

## 2012-05-14 DIAGNOSIS — I10 Essential (primary) hypertension: Secondary | ICD-10-CM | POA: Insufficient documentation

## 2012-05-14 DIAGNOSIS — R079 Chest pain, unspecified: Secondary | ICD-10-CM

## 2012-05-14 MED ORDER — TECHNETIUM TC 99M SESTAMIBI GENERIC - CARDIOLITE
11.0000 | Freq: Once | INTRAVENOUS | Status: AC | PRN
  Administered 2012-05-14: 11 via INTRAVENOUS

## 2012-05-14 MED ORDER — TECHNETIUM TC 99M SESTAMIBI GENERIC - CARDIOLITE
33.0000 | Freq: Once | INTRAVENOUS | Status: AC | PRN
  Administered 2012-05-14: 33 via INTRAVENOUS

## 2012-05-14 NOTE — Progress Notes (Signed)
[facility] SITE 3 NUCLEAR MED [address] [place] [address] [phone]  Cardiology Nuclear Med Study  Lindsey Ferrell is a 56 y.o. female     MRN : [id number]     DOB: 03/15/1956  Procedure Date: 05/14/2012  Nuclear Med Background Indication for Stress Test:  Evaluation for Ischemia History:  >10 yrs ago GXT:OK per patient Cardiac Risk Factors: Hypertension and Lipids  Symptoms:  Chest Pressure with Exertion (last episode of chest discomfort was about 2-days ago), Diaphoresis, Dizziness, DOE, Fatigue, Nausea, Near Syncope, Palpitations and Rapid HR   Nuclear Pre-Procedure Caffeine/Decaff Intake:  None in 12 hours NPO After: 7:30pm   Lungs:  Clear. O2 Sat: 97% on room air. IV 0.9% NS with Angio Cath:  22g  IV Site: R Hand  IV Started by:  Bonnita Levan, RN  Chest Size (in):  38 Cup Size: C  Height: 5\' 5"  (1.651 m)  Weight:  160 lb (72.576 kg)  BMI:  Body mass index is 26.63 kg/(m^2). Tech Comments:  Patient took all med's as directed    Nuclear Med Study 1 or 2 day study: 1 day  Stress Test Type:  Stress  Reading MD: Dietrich Pates, MD  Order Authorizing Provider:  Rene Paci, MD  Resting Radionuclide: Technetium 36m Sestamibi  Resting Radionuclide Dose: 11.0 mCi   Stress Radionuclide:  Technetium 22m Sestamibi  Stress Radionuclide Dose: 33.0 mCi           Stress Protocol Rest HR: 63 Stress HR: 151  Rest BP: 125/72 Stress BP: 167/81  Exercise Time (min): 8:00 METS: 10.1   Predicted Max HR: 162 bpm % Max HR: 93.21 bpm Rate Pressure Product: 84132   Dose of Adenosine (mg):  n/a Dose of Lexiscan: n/a mg  Dose of Atropine (mg): n/a Dose of Dobutamine: n/a mcg/kg/min (at max HR)  Stress Test Technologist: Smiley Houseman, CMA-N  Nuclear Technologist:  Domenic Polite, CNMT     Rest Procedure:  Myocardial perfusion imaging was performed at rest 45 minutes following the intravenous administration of Technetium 61m Sestamibi.  Rest ECG:  NSR  Stress Procedure:  The patient exercised on the treadmill utilizing the Bruce protocol for eight minutes. The patient then stopped due to fatigue and denied any chest pain.  There were no diagnostic ST-T wave changes; there was nonspecific upsloping. With occasional PVC's.   Technetium 92m Sestamibi was injected at peak exercise and myocardial perfusion imaging was performed after a brief delay.  Stress ECG: No significant change from baseline ECG  QPS Raw Data Images:  Soft tissue (breast, diaphragm, bowel) surround heart. Stress Images:  Normal homogeneous uptake in all areas of the myocardium. Rest Images:  Normal homogeneous uptake in all areas of the myocardium. Subtraction (SDS):  No evidence of ischemia. Transient Ischemic Dilatation (Normal <1.22):  1.03 Lung/Heart Ratio (Normal <0.45):  0.24  Quantitative Gated Spect Images QGS EDV:  76 ml QGS ESV:  31 ml  Impression Exercise Capacity:  Good exercise capacity. BP Response:  Normal blood pressure response. Clinical Symptoms:  No chest pain. ECG Impression:  No significant ST segment change suggestive of ischemia. Comparison with Prior Nuclear Study: No previous nuclear study performed  Overall Impression:  Normal stress nuclear study.  LV Ejection Fraction: 59%.  LV Wall Motion:  NL LV Function; NL Wall Motion  Dietrich Pates    .

## 2012-05-21 ENCOUNTER — Ambulatory Visit (INDEPENDENT_AMBULATORY_CARE_PROVIDER_SITE_OTHER): Payer: 59 | Admitting: Endocrinology

## 2012-05-21 ENCOUNTER — Encounter: Payer: Self-pay | Admitting: Endocrinology

## 2012-05-21 VITALS — BP 124/82 | HR 71 | Temp 98.1°F | Ht 65.0 in | Wt 166.0 lb

## 2012-05-21 DIAGNOSIS — E041 Nontoxic single thyroid nodule: Secondary | ICD-10-CM

## 2012-05-21 NOTE — Patient Instructions (Addendum)
You should have the thyroid examined, and the blood test, as part of your annual checkup. i would be happy to see you back here whenever you want.

## 2012-05-21 NOTE — Progress Notes (Signed)
Subjective:    Patient ID: Lindsey Ferrell, female    DOB: 02-12-1956, 56 y.o.   MRN: [id number]  HPI Pt has 20 years of hypothyroidism.  She has been on synthroid since then.  She has sxs of pain at the upper part of the body, and assoc weight gain. Past Medical History  Diagnosis Date  . Deficiency of other vitamins   . Sickle-cell trait   . Abnormal glandular Papanicolaou smear of cervix   . ALLERGIC RHINITIS, SEASONAL   . ASTHMA   . Melanoma     (R) thigh  . HYPERTENSION   . HYPERLIPIDEMIA, BORDERLINE   . GERD   . THYROID NODULE, RIGHT   . Unspecified hypothyroidism     Past Surgical History  Procedure Date  . Tonsillectomy     childhood  . Nasal sinus surgery   . Melanoma in situ excision 2010    (R) thigh   . Cholecystectomy 04/2005    History   Social History  . Marital Status: Married    Spouse Name: N/A    Number of Children: N/A  . Years of Education: N/A   Occupational History  . Not on file.   Social History Main Topics  . Smoking status: Never Smoker   . Smokeless tobacco: Not on file   Comment: Married, 2 grown dtr with g-kids plus 2 adopted children, living at home  . Alcohol Use: Yes     Occassional  . Drug Use: No  . Sexually Active:    Other Topics Concern  . Not on file   Social History Narrative  . No narrative on file    Current Outpatient Prescriptions on File Prior to Visit  Medication Sig Dispense Refill  . levothyroxine (SYNTHROID, LEVOTHROID) 137 MCG tablet TAKE 1 TABLET BY MOUTH EVERY DAY  90 tablet  2  . mometasone (NASONEX) 50 MCG/ACT nasal spray Place 2 sprays into the nose as needed.         No Known Allergies  Family History  Problem Relation Age of Onset  . Arthritis Mother   . Arthritis Father   . Breast cancer Sister   . Diabetes Maternal Uncle   . Hypertension Other     parent & grandparent  . Heart disease Other     parent & grandparent  no thyroid probs.  BP 124/82  Pulse 71  Temp 98.1 F (36.7  C) (Oral)  Ht 5\' 5"  (1.651 m)  Wt 166 lb (75.297 kg)  BMI 27.62 kg/m2  SpO2 97%    Review of Systems She has fatigue, dry skin, hair loss, difficulty with concentration, deepening of the voice, and itching at the back of the head. denies depression, cramps, sob, constipation, numbness, blurry vision, rash, rhinorrhea, easy bruising, and syncope.      Objective:   Physical Exam VS: see vs page GEN: no distress HEAD: head: no deformity eyes: no periorbital swelling, no proptosis external nose and ears are normal mouth: no lesion seen NECK: i do not appreciate any thyroid enlargement CHEST WALL: no deformity LUNGS:  Clear to auscultation. CV: reg rate and rhythm, no murmur MUSCULOSKELETAL: muscle bulk and strength are grossly normal.  no obvious joint swelling.  gait is normal and steady EXTEMITIES: no deformity.  no ulcer on the feet.  feet are of normal color and temp.  no edema PULSES: dorsalis pedis intact bilat.   NEURO:  cn 2-12 grossly intact.   readily moves all 4's.  sensation is  intact to touch on the feet SKIN:  Normal texture and temperature.  No rash or suspicious lesion is visible.  At the back of the neck, there is a 5x5 cm area of redness (pt says this is a birthmark) NODES:  None palpable at the neck PSYCH: alert, oriented x3.  Does not appear anxious nor depressed.  (i reviewed [name] report) Lab Results  Component Value Date   TSH 1.05 04/30/2012      Assessment & Plan:  Chronic hypothyroidism, well-replaced Goiter, prob due to chronic hypothyroidism.  i view of poorly-defined anatomy and lack of change over a period of many years, i don't think pt needs bx now Myalgias, not thyroid-related Pruritis.  This is at an area favored by psoriasis, but i see no evidence of it now

## 2012-06-04 ENCOUNTER — Encounter: Payer: Self-pay | Admitting: Gastroenterology

## 2012-06-19 ENCOUNTER — Encounter: Payer: 59 | Admitting: Gastroenterology

## 2012-07-03 LAB — HM MAMMOGRAPHY

## 2012-07-05 ENCOUNTER — Other Ambulatory Visit: Payer: Self-pay | Admitting: Internal Medicine

## 2012-07-06 ENCOUNTER — Other Ambulatory Visit: Payer: Self-pay | Admitting: *Deleted

## 2012-07-06 ENCOUNTER — Other Ambulatory Visit: Payer: Self-pay | Admitting: Internal Medicine

## 2012-07-06 MED ORDER — LEVOTHYROXINE SODIUM 137 MCG PO TABS: 137.0000 ug | ORAL_TABLET | Freq: Every day | ORAL | Status: DC

## 2012-07-10 ENCOUNTER — Ambulatory Visit (AMBULATORY_SURGERY_CENTER): Payer: 59

## 2012-07-10 VITALS — Ht 64.5 in | Wt 168.4 lb

## 2012-07-10 DIAGNOSIS — Z1211 Encounter for screening for malignant neoplasm of colon: Secondary | ICD-10-CM

## 2012-07-15 ENCOUNTER — Other Ambulatory Visit: Payer: Self-pay | Admitting: *Deleted

## 2012-07-15 MED ORDER — LEVOTHYROXINE SODIUM 137 MCG PO TABS: 137.0000 ug | ORAL_TABLET | Freq: Every day | ORAL | Status: DC

## 2012-07-20 ENCOUNTER — Telehealth: Payer: Self-pay | Admitting: Gastroenterology

## 2012-07-20 ENCOUNTER — Telehealth: Payer: Self-pay

## 2012-07-20 NOTE — Telephone Encounter (Signed)
If she's scheduled for colonoscopy she likely can be prepped and properly for tomorrow's exam. I would schedule for tomorrow if possible. Otherwise she should be charged.

## 2012-07-20 NOTE — Telephone Encounter (Signed)
Pt has rescheduled.  She was scheduled for a procedure for tomorrow.  Is concerned about cancellation fee.  She wanted to be sure MD knows how apologetic she was via telephone.  She has not followed her instructions and had other issues with coming tomorrow.  RN agreed to enter this note.

## 2012-07-21 ENCOUNTER — Encounter: Payer: 59 | Admitting: Gastroenterology

## 2012-08-04 ENCOUNTER — Other Ambulatory Visit: Payer: Self-pay | Admitting: Internal Medicine

## 2012-08-05 ENCOUNTER — Encounter: Payer: Self-pay | Admitting: Gastroenterology

## 2012-08-05 ENCOUNTER — Ambulatory Visit (AMBULATORY_SURGERY_CENTER): Payer: 59 | Admitting: Gastroenterology

## 2012-08-05 VITALS — BP 114/84 | HR 61 | Temp 97.5°F | Resp 16 | Ht 64.0 in | Wt 168.0 lb

## 2012-08-05 DIAGNOSIS — Z1211 Encounter for screening for malignant neoplasm of colon: Secondary | ICD-10-CM

## 2012-08-05 DIAGNOSIS — K5730 Diverticulosis of large intestine without perforation or abscess without bleeding: Secondary | ICD-10-CM

## 2012-08-05 HISTORY — PX: COLONOSCOPY: SHX174

## 2012-08-05 MED ORDER — SODIUM CHLORIDE 0.9 % IV SOLN: 500.0000 mL | INTRAVENOUS | Status: DC

## 2012-08-05 MED ORDER — HYOSCYAMINE SULFATE ER 0.375 MG PO TBCR: EXTENDED_RELEASE_TABLET | ORAL | Status: DC

## 2012-08-05 NOTE — Op Note (Signed)
[facility] [address] [place], [address]   COLONOSCOPY PROCEDURE REPORT  PATIENT: Lindsey Ferrell, Lindsey Ferrell  MR#: [id number] BIRTHDATE: 06-04-1956 , 56  yrs. old GENDER: Female ENDOSCOPIST: Louis Meckel, MD REFERRED [id number] Felicity Coyer, M.D. PROCEDURE DATE:  08/05/2012 PROCEDURE:   Colonoscopy, diagnostic ASA CLASS:   Class II INDICATIONS:average risk screening. MEDICATIONS: MAC sedation, administered by CRNA and propofol (Diprivan) 150mg  IV  DESCRIPTION OF PROCEDURE:   After the risks benefits and alternatives of the procedure were thoroughly explained, informed consent was obtained.       The LB CF-[id number]  endoscope was introduced through the anus and advanced to the cecum, which was identified by the ileocecal valve. No adverse events experienced. The quality of the prep was Suprep excellent  The instrument was then slowly withdrawn as the colon was fully examined.      COLON FINDINGS: Mild diverticulosis was noted in the sigmoid colon. The colon mucosa was otherwise normal.  Retroflexed views revealed no abnormalities. The time to cecum=2 minutes 57 seconds. Withdrawal time=6 minutes 22 seconds.  The scope was withdrawn and the procedure completed. COMPLICATIONS: There were no complications.  ENDOSCOPIC IMPRESSION: 1.   Mild diverticulosis was noted in the sigmoid colon 2.   The colon mucosa was otherwise normal  RECOMMENDATIONS: Continue current colorectal screening recommendations for "routine risk" patients with a repeat colonoscopy in 10 years.   eSigned:  Louis Meckel, MD 08/05/2012 8:31 AM   cc:

## 2012-08-05 NOTE — Progress Notes (Signed)
Patient did not have preoperative order for IV antibiotic SSI prophylaxis. (G8918)  Patient did not experience any of the following events: a burn prior to discharge; a fall within the facility; wrong site/side/patient/procedure/implant event; or a hospital transfer or hospital admission upon discharge from the facility. (G8907)  

## 2012-08-05 NOTE — Patient Instructions (Addendum)
Impressions/recommendations:  Diverticulosis (handout given)  Repeat colonoscopy in 10 years.  YOU HAD AN ENDOSCOPIC PROCEDURE TODAY AT THE [facility]: Refer to the procedure report that was given to you for any specific questions about what was found during the examination.  If the procedure report does not answer your questions, please call your gastroenterologist to clarify.  If you requested that your care partner not be given the details of your procedure findings, then the procedure report has been included in a sealed envelope for you to review at your convenience later.  YOU SHOULD EXPECT: Some feelings of bloating in the abdomen. Passage of more gas than usual.  Walking can help get rid of the air that was put into your GI tract during the procedure and reduce the bloating. If you had a lower endoscopy (such as a colonoscopy or flexible sigmoidoscopy) you may notice spotting of blood in your stool or on the toilet paper. If you underwent a bowel prep for your procedure, then you may not have a normal bowel movement for a few days.  DIET: Your first meal following the procedure should be a light meal and then it is ok to progress to your normal diet.  A half-sandwich or bowl of soup is an example of a good first meal.  Heavy or fried foods are harder to digest and may make you feel nauseous or bloated.  Likewise meals heavy in dairy and vegetables can cause extra gas to form and this can also increase the bloating.  Drink plenty of fluids but you should avoid alcoholic beverages for 24 hours.  ACTIVITY: Your care partner should take you home directly after the procedure.  You should plan to take it easy, moving slowly for the rest of the day.  You can resume normal activity the day after the procedure however you should NOT DRIVE or use heavy machinery for 24 hours (because of the sedation medicines used during the test).    SYMPTOMS TO REPORT IMMEDIATELY: A gastroenterologist  can be reached at any hour.  During normal business hours, 8:30 AM to 5:00 PM Monday through Friday, call [phone].  After hours and on weekends, please call the GI answering service at [phone] who will take a message and have the physician on call contact you.   Following lower endoscopy (colonoscopy or flexible sigmoidoscopy):  Excessive amounts of blood in the stool  Significant tenderness or worsening of abdominal pains  Swelling of the abdomen that is new, acute  Fever of 100F or higher   FOLLOW UP: If any biopsies were taken you will be contacted by phone or by letter within the next 1-3 weeks.  Call your gastroenterologist if you have not heard about the biopsies in 3 weeks.  Our staff will call the home number listed on your records the next business day following your procedure to check on you and address any questions or concerns that you may have at that time regarding the information given to you following your procedure. This is a courtesy call and so if there is no answer at the home number and we have not heard from you through the emergency physician on call, we will assume that you have returned to your regular daily activities without incident.  SIGNATURES/CONFIDENTIALITY: You and/or your care partner have signed paperwork which will be entered into your electronic medical record.  These signatures attest to the fact that that the information above on your After Visit Summary has  been reviewed and is understood.  Full responsibility of the confidentiality of this discharge information lies with you and/or your care-partner.  

## 2012-08-06 ENCOUNTER — Telehealth: Payer: Self-pay | Admitting: *Deleted

## 2012-08-06 NOTE — Telephone Encounter (Signed)
  Follow up Call-  Call back number 08/05/2012  Post procedure Call Back phone  # [phone]  Permission to leave phone message Yes     Patient questions:  Message left for pt to call if necessary.

## 2013-06-17 ENCOUNTER — Ambulatory Visit (INDEPENDENT_AMBULATORY_CARE_PROVIDER_SITE_OTHER): Payer: 59 | Admitting: Internal Medicine

## 2013-06-17 ENCOUNTER — Encounter: Payer: Self-pay | Admitting: Internal Medicine

## 2013-06-17 VITALS — BP 112/82 | HR 83 | Temp 98.8°F | Wt 167.0 lb

## 2013-06-17 DIAGNOSIS — J309 Allergic rhinitis, unspecified: Secondary | ICD-10-CM

## 2013-06-17 DIAGNOSIS — Z0000 Encounter for general adult medical examination without abnormal findings: Secondary | ICD-10-CM

## 2013-06-17 DIAGNOSIS — Z23 Encounter for immunization: Secondary | ICD-10-CM

## 2013-06-17 DIAGNOSIS — J0100 Acute maxillary sinusitis, unspecified: Secondary | ICD-10-CM

## 2013-06-17 MED ORDER — MOMETASONE FUROATE 50 MCG/ACT NA SUSP: 2.0000 | Freq: Every day | NASAL | Status: DC

## 2013-06-17 MED ORDER — LEVOFLOXACIN 500 MG PO TABS: 500.0000 mg | ORAL_TABLET | Freq: Every day | ORAL | Status: DC

## 2013-06-17 NOTE — Patient Instructions (Addendum)
It was good to see you today.  We have reviewed your prior records including labs and tests today  Levaquin antibiotics daily x 10 days - Your prescription(s) have been submitted to your pharmacy. Please take as directed and contact our office if you believe you are having problem(s) with the medication(s).  Continue Nasonex - refill done  Return for your physical labs when you're fasting please schedule annual physical in the next few weeks at your convenience  Sinusitis Sinusitis is redness, soreness, and swelling (inflammation) of the paranasal sinuses. Paranasal sinuses are air pockets within the bones of your face (beneath the eyes, the middle of the forehead, or above the eyes). In healthy paranasal sinuses, mucus is able to drain out, and air is able to circulate through them by way of your nose. However, when your paranasal sinuses are inflamed, mucus and air can become trapped. This can allow bacteria and other germs to grow and cause infection. Sinusitis can develop quickly and last only a short time (acute) or continue over a long period (chronic). Sinusitis that lasts for more than 12 weeks is considered chronic.  CAUSES  Causes of sinusitis include:  Allergies.  Structural abnormalities, such as displacement of the cartilage that separates your nostrils (deviated septum), which can decrease the air flow through your nose and sinuses and affect sinus drainage.  Functional abnormalities, such as when the small hairs (cilia) that line your sinuses and help remove mucus do not work properly or are not present. SYMPTOMS  Symptoms of acute and chronic sinusitis are the same. The primary symptoms are pain and pressure around the affected sinuses. Other symptoms include:  Upper toothache.  Earache.  Headache.  Bad breath.  Decreased sense of smell and taste.  A cough, which worsens when you are lying flat.  Fatigue.  Fever.  Thick drainage from your nose, which often is  green and may contain pus (purulent).  Swelling and warmth over the affected sinuses. DIAGNOSIS  Your caregiver will perform a physical exam. During the exam, your caregiver may:  Look in your nose for signs of abnormal growths in your nostrils (nasal polyps).  Tap over the affected sinus to check for signs of infection.  View the inside of your sinuses (endoscopy) with a special imaging device with a light attached (endoscope), which is inserted into your sinuses. If your caregiver suspects that you have chronic sinusitis, one or more of the following tests may be recommended:  Allergy tests.  Nasal culture A sample of mucus is taken from your nose and sent to a lab and screened for bacteria.  Nasal cytology A sample of mucus is taken from your nose and examined by your caregiver to determine if your sinusitis is related to an allergy. TREATMENT  Most cases of acute sinusitis are related to a viral infection and will resolve on their own within 10 days. Sometimes medicines are prescribed to help relieve symptoms (pain medicine, decongestants, nasal steroid sprays, or saline sprays).  However, for sinusitis related to a bacterial infection, your caregiver will prescribe antibiotic medicines. These are medicines that will help kill the bacteria causing the infection.  Rarely, sinusitis is caused by a fungal infection. In theses cases, your caregiver will prescribe antifungal medicine. For some cases of chronic sinusitis, surgery is needed. Generally, these are cases in which sinusitis recurs more than 3 times per year, despite other treatments. HOME CARE INSTRUCTIONS   Drink plenty of water. Water helps thin the mucus so your  sinuses can drain more easily.  Use a humidifier.  Inhale steam 3 to 4 times a day (for example, sit in the bathroom with the shower running).  Apply a warm, moist washcloth to your face 3 to 4 times a day, or as directed by your caregiver.  Use saline nasal  sprays to help moisten and clean your sinuses.  Take over-the-counter or prescription medicines for pain, discomfort, or fever only as directed by your caregiver. SEEK IMMEDIATE MEDICAL CARE IF:  You have increasing pain or severe headaches.  You have nausea, vomiting, or drowsiness.  You have swelling around your face.  You have vision problems.  You have a stiff neck.  You have difficulty breathing. MAKE SURE YOU:   Understand these instructions.  Will watch your condition.  Will get help right away if you are not doing well or get worse. Document Released: 08/19/2005 Document Revised: 11/11/2011 Document Reviewed: 09/03/2011 [facility] Patient Information 2014 [place], [place].

## 2013-06-17 NOTE — Progress Notes (Signed)
  Subjective:    HPI  complains of head cold symptoms, ?sinusitus Onset >1 month ago, initially improved then relapsing and worse symptoms  First associated with rhinorrhea, sneezing, sore throat, mild headache and low grade fever Now sinus pressure and mod nasal congestion, yellow-green discharge minimal relief with OTC meds Precipitated by sick contacts and weather change  Past Medical History  Diagnosis Date  . Deficiency of other vitamins   . Sickle-cell trait   . Abnormal glandular Papanicolaou smear of cervix   . ALLERGIC RHINITIS, SEASONAL   . ASTHMA   . Melanoma     (R) thigh  . HYPERTENSION   . HYPERLIPIDEMIA, BORDERLINE   . GERD   . THYROID NODULE, RIGHT   . Unspecified hypothyroidism     Review of Systems Constitutional: No night sweats, no unexpected weight change Pulmonary: No pleurisy or hemoptysis Cardiovascular: No chest pain or palpitations     Objective:   Physical Exam BP 112/82  Pulse 83  Temp(Src) 98.8 F (37.1 C) (Oral)  Wt 167 lb (75.751 kg)  BMI 28.65 kg/m2  SpO2 97% GEN: mildly ill appearing and audible head/chest congestion HENT: NCAT, mod sinus tenderness bilaterally, nares with thick discharge and turbinate swelling, oropharynx mild erythema and PND, no exudate Eyes: Vision grossly intact, no conjunctivitis Lungs: Clear to auscultation without rhonchi or wheeze, no increased work of breathing Cardiovascular: Regular rate and rhythm, no bilateral edema  Lab Results  Component Value Date   WBC 6.4 04/30/2012   HGB 14.2 04/30/2012   HCT 42.7 04/30/2012   PLT 195.0 04/30/2012   GLUCOSE 89 04/30/2012   CHOL 198 04/30/2012   TRIG 96.0 04/30/2012   HDL 50.50 04/30/2012   LDLDIRECT 150.0 01/02/2011   LDLCALC 128* 04/30/2012   ALT 19 04/30/2012   AST 18 04/30/2012   NA 139 04/30/2012   K 4.2 04/30/2012   CL 104 04/30/2012   CREATININE 0.6 04/30/2012   BUN 18 04/30/2012   CO2 27 04/30/2012   TSH 1.05 04/30/2012      Assessment & Plan:  Viral URI  > progression to acute sinusitis allergic rhinitis  Cough, postnasal drip related to above   Empiric antibiotics prescribed due to symptom duration greater than 7 days and progression despite OTC symptomatic care Nasal steroids - new prescriptions done Symptomatic care with Tylenol or Advil, decongestants, antihistamine, hydration and rest -  Saline irrigation and salt gargle advised as needed

## 2013-06-17 NOTE — Progress Notes (Signed)
Pre-visit discussion using our clinic review tool. No additional management support is needed unless otherwise documented below in the visit note.  

## 2013-06-22 ENCOUNTER — Other Ambulatory Visit (INDEPENDENT_AMBULATORY_CARE_PROVIDER_SITE_OTHER): Payer: 59

## 2013-06-22 DIAGNOSIS — Z0000 Encounter for general adult medical examination without abnormal findings: Secondary | ICD-10-CM

## 2013-06-22 LAB — BASIC METABOLIC PANEL
BUN: 21 mg/dL (ref 6–23)
Calcium: 9.7 mg/dL (ref 8.4–10.5)
Creatinine, Ser: 0.7 mg/dL (ref 0.4–1.2)
GFR: 92.33 mL/min (ref >60.00)

## 2013-06-22 LAB — URINALYSIS, ROUTINE W REFLEX MICROSCOPIC
Bilirubin Urine: NEGATIVE
Ketones, ur: NEGATIVE
Specific Gravity, Urine: 1.015 (ref 1.000–1.030)
Total Protein, Urine: NEGATIVE
Urine Glucose: NEGATIVE
Urobilinogen, UA: 0.2 (ref 0.0–1.0)
pH: 6 (ref 5.0–8.0)

## 2013-06-22 LAB — LIPID PANEL
Cholesterol: 222 mg/dL — ABNORMAL HIGH (ref 0–200)
Total CHOL/HDL Ratio: 5
VLDL: 16 mg/dL (ref 0.0–40.0)

## 2013-06-22 LAB — CBC WITH DIFFERENTIAL/PLATELET
Basophils Relative: 1.2 % (ref 0.0–3.0)
Eosinophils Relative: 3.7 % (ref 0.0–5.0)
Lymphocytes Relative: 28.5 % (ref 12.0–46.0)
Monocytes Relative: 8.8 % (ref 3.0–12.0)
Neutrophils Relative %: 57.8 % (ref 43.0–77.0)
RBC: 5.06 Mil/uL (ref 3.87–5.11)
WBC: 6.4 10*3/uL (ref 4.5–10.5)

## 2013-06-22 LAB — HEPATIC FUNCTION PANEL
AST: 15 U/L (ref 0–37)
Albumin: 3.9 g/dL (ref 3.5–5.2)
Alkaline Phosphatase: 82 U/L (ref 39–117)
Total Protein: 7.3 g/dL (ref 6.0–8.3)

## 2013-06-22 LAB — TSH: TSH: 2.99 u[IU]/mL (ref 0.35–5.50)

## 2013-06-23 ENCOUNTER — Telehealth: Payer: Self-pay | Admitting: *Deleted

## 2013-06-23 MED ORDER — AMOXICILLIN-POT CLAVULANATE 875-125 MG PO TABS: 1.0000 | ORAL_TABLET | Freq: Two times a day (BID) | ORAL | Status: DC

## 2013-06-23 NOTE — Telephone Encounter (Signed)
Pt called states she is not improving from her 10.16.14 OV, states she is still experiencing productive cough, ear fullness, and nasal congestion.  Pt requests Augmentin.  Please advise

## 2013-06-23 NOTE — Telephone Encounter (Signed)
Spoke with pt advised rx sent. 

## 2013-06-23 NOTE — Telephone Encounter (Signed)
New prescription for Augmentin, done

## 2013-06-28 ENCOUNTER — Encounter: Payer: Self-pay | Admitting: Internal Medicine

## 2013-06-28 ENCOUNTER — Ambulatory Visit (INDEPENDENT_AMBULATORY_CARE_PROVIDER_SITE_OTHER): Payer: 59 | Admitting: Internal Medicine

## 2013-06-28 VITALS — BP 118/80 | HR 63 | Temp 98.4°F | Ht 65.5 in | Wt 165.1 lb

## 2013-06-28 DIAGNOSIS — Z79899 Other long term (current) drug therapy: Secondary | ICD-10-CM

## 2013-06-28 DIAGNOSIS — E039 Hypothyroidism, unspecified: Secondary | ICD-10-CM

## 2013-06-28 DIAGNOSIS — E785 Hyperlipidemia, unspecified: Secondary | ICD-10-CM

## 2013-06-28 DIAGNOSIS — Z0000 Encounter for general adult medical examination without abnormal findings: Secondary | ICD-10-CM

## 2013-06-28 MED ORDER — ROSUVASTATIN CALCIUM 10 MG PO TABS: 10.0000 mg | ORAL_TABLET | Freq: Every day | ORAL | Status: DC

## 2013-06-28 NOTE — Assessment & Plan Note (Addendum)
Lab Results  Component Value Date   TSH 2.99 06/22/2013  Pt reports compliance with current treatment. Has seen endocrine Everardo All) [phone] with no need for biopsy. Continue current plan.

## 2013-06-28 NOTE — Patient Instructions (Addendum)
It was good to see you today.  We have reviewed your prior records including labs and tests today  Health Maintenance reviewed - follow up for mammogram -all other recommended immunizations and age-appropriate screenings are up-to-date.  Medications reviewed and updated -start Crestor 10 mg daily for cholesterol as discussed - no other changes recommended at this time.  Your prescription(s) have been submitted to your pharmacy. Please take as directed and contact our office if you believe you are having problem(s) with the medication(s).  Return in 3 months for lab testing only to recheck cholesterol and liver test  Followup in 6-12 months for weight check and review, please call sooner if problems  Health Maintenance, Females A healthy lifestyle and preventative care can promote health and wellness.  Maintain regular health, dental, and eye exams.  Eat a healthy diet. Foods like vegetables, fruits, whole grains, low-fat dairy products, and lean protein foods contain the nutrients you need without too many calories. Decrease your intake of foods high in solid fats, added sugars, and salt. Get information about a proper diet from your caregiver, if necessary.  Regular physical exercise is one of the most important things you can do for your health. Most adults should get at least 150 minutes of moderate-intensity exercise (any activity that increases your heart rate and causes you to sweat) each week. In addition, most adults need muscle-strengthening exercises on 2 or more days a week.   Maintain a healthy weight. The body mass index (BMI) is a screening tool to identify possible weight problems. It provides an estimate of body fat based on height and weight. Your caregiver can help determine your BMI, and can help you achieve or maintain a healthy weight. For adults 20 years and older:  A BMI below 18.5 is considered underweight.  A BMI of 18.5 to 24.9 is normal.  A BMI of 25 to 29.9  is considered overweight.  A BMI of 30 and above is considered obese.  Maintain normal blood lipids and cholesterol by exercising and minimizing your intake of saturated fat. Eat a balanced diet with plenty of fruits and vegetables. Blood tests for lipids and cholesterol should begin at age 61 and be repeated every 5 years. If your lipid or cholesterol levels are high, you are over 50, or you are a high risk for heart disease, you may need your cholesterol levels checked more frequently.Ongoing high lipid and cholesterol levels should be treated with medicines if diet and exercise are not effective.  If you smoke, find out from your caregiver how to quit. If you do not use tobacco, do not start.  If you are pregnant, do not drink alcohol. If you are breastfeeding, be very cautious about drinking alcohol. If you are not pregnant and choose to drink alcohol, do not exceed 1 drink per day. One drink is considered to be 12 ounces (355 mL) of beer, 5 ounces (148 mL) of wine, or 1.5 ounces (44 mL) of liquor.  Avoid use of street drugs. Do not share needles with anyone. Ask for help if you need support or instructions about stopping the use of drugs.  High blood pressure causes heart disease and increases the risk of stroke. Blood pressure should be checked at least every 1 to 2 years. Ongoing high blood pressure should be treated with medicines, if weight loss and exercise are not effective.  If you are 41 to 59 years old, ask your caregiver if you should take aspirin to prevent strokes.  Diabetes screening involves taking a blood sample to check your fasting blood sugar level. This should be done once every 3 years, after age 78, if you are within normal weight and without risk factors for diabetes. Testing should be considered at a younger age or be carried out more frequently if you are overweight and have at least 1 risk factor for diabetes.  Breast cancer screening is essential preventative care  for women. You should practice "breast self-awareness." This means understanding the normal appearance and feel of your breasts and may include breast self-examination. Any changes detected, no matter how small, should be reported to a caregiver. Women in their 17s and 30s should have a clinical breast exam (CBE) by a caregiver as part of a regular health exam every 1 to 3 years. After age 62, women should have a CBE every year. Starting at age 38, women should consider having a mammogram (breast X-ray) every year. Women who have a family history of breast cancer should talk to their caregiver about genetic screening. Women at a high risk of breast cancer should talk to their caregiver about having an MRI and a mammogram every year.  The Pap test is a screening test for cervical cancer. Women should have a Pap test starting at age 34. Between ages 18 and 76, Pap tests should be repeated every 2 years. Beginning at age 17, you should have a Pap test every 3 years as long as the past 3 Pap tests have been normal. If you had a hysterectomy for a problem that was not cancer or a condition that could lead to cancer, then you no longer need Pap tests. If you are between ages 29 and 70, and you have had normal Pap tests going back 10 years, you no longer need Pap tests. If you have had past treatment for cervical cancer or a condition that could lead to cancer, you need Pap tests and screening for cancer for at least 20 years after your treatment. If Pap tests have been discontinued, risk factors (such as a new sexual partner) need to be reassessed to determine if screening should be resumed. Some women have medical problems that increase the chance of getting cervical cancer. In these cases, your caregiver may recommend more frequent screening and Pap tests.  The human papillomavirus (HPV) test is an additional test that may be used for cervical cancer screening. The HPV test looks for the virus that can cause the  cell changes on the cervix. The cells collected during the Pap test can be tested for HPV. The HPV test could be used to screen women aged 32 years and older, and should be used in women of any age who have unclear Pap test results. After the age of 72, women should have HPV testing at the same frequency as a Pap test.  Colorectal cancer can be detected and often prevented. Most routine colorectal cancer screening begins at the age of 41 and continues through age 69. However, your caregiver may recommend screening at an earlier age if you have risk factors for colon cancer. On a yearly basis, your caregiver may provide home test kits to check for hidden blood in the stool. Use of a small camera at the end of a tube, to directly examine the colon (sigmoidoscopy or colonoscopy), can detect the earliest forms of colorectal cancer. Talk to your caregiver about this at age 48, when routine screening begins. Direct examination of the colon should be repeated every 5  to 10 years through age 24, unless early forms of pre-cancerous polyps or small growths are found.  Hepatitis C blood testing is recommended for all people born from [birth year] through 1965 and any individual with known risks for hepatitis C.  Practice safe sex. Use condoms and avoid high-risk sexual practices to reduce the spread of sexually transmitted infections (STIs). Sexually active women aged 33 and younger should be checked for Chlamydia, which is a common sexually transmitted infection. Older women with new or multiple partners should also be tested for Chlamydia. Testing for other STIs is recommended if you are sexually active and at increased risk.  Osteoporosis is a disease in which the bones lose minerals and strength with aging. This can result in serious bone fractures. The risk of osteoporosis can be identified using a bone density scan. Women ages 74 and over and women at risk for fractures or osteoporosis should discuss screening with their  caregivers. Ask your caregiver whether you should be taking a calcium supplement or vitamin D to reduce the rate of osteoporosis.  Menopause can be associated with physical symptoms and risks. Hormone replacement therapy is available to decrease symptoms and risks. You should talk to your caregiver about whether hormone replacement therapy is right for you.  Use sunscreen with a sun protection factor (SPF) of 30 or greater. Apply sunscreen liberally and repeatedly throughout the day. You should seek shade when your shadow is shorter than you. Protect yourself by wearing long sleeves, pants, a wide-brimmed hat, and sunglasses year round, whenever you are outdoors.  Notify your caregiver of new moles or changes in moles, especially if there is a change in shape or color. Also notify your caregiver if a mole is larger than the size of a pencil eraser.  Stay current with your immunizations. Document Released: 03/04/2011 Document Revised: 11/11/2011 Document Reviewed: 03/04/2011 [facility] Patient Information 2014 [place], [place].

## 2013-06-28 NOTE — Assessment & Plan Note (Addendum)
Unable to tolerate simva and crestor trials in past due to myalgias/increase CK (2009) - resolved off statin Encouraged attention to diet and exercise with weight control Lipids still elevated off statin, and due to FH, pt willing to try another trial of statin.  Will RX Crestor 10mg  daily at this time - pt advised can try 1/2 dose or taking every other day if symptoms of myalgias return.  Recheck Lipids/liver in 3 months.

## 2013-06-28 NOTE — Progress Notes (Signed)
Subjective:    Patient ID: Lindsey Ferrell, female    DOB: Sep 07, 1955, 57 y.o.   MRN: [id number]  HPI  Patient here today for annual physical.  Feels well overall Chronic medical issues also reviewed.  dyslipidemia- stopped crestor 12/2009 due to myalgias  Then resumed med tx 06/2010 - 10/2010 - stopped due to myalgia symptoms. hx nonsp elevation of CPK 2008 on statin, verified with repeat labs, then normalized off tx.  Pt willing to reattempt statin trial.   hypothyroid - reports compliance with ongoing medical treatment and no changes in medication dose or frequency. denies adverse side effects related to current therapy. no skin/hair change, weight changes reviewed.  Seen by endocrine Everardo All) September 2013 - no bx needed  GERD - no regular NSAID use, no hx ulcers - EGD 07/2010 unremarkable - overlap with IBS symptoms - improved with TUMS  Past Medical History  Diagnosis Date  . Deficiency of other vitamins   . Sickle-cell trait   . Abnormal glandular Papanicolaou smear of cervix   . ALLERGIC RHINITIS, SEASONAL   . ASTHMA   . Melanoma     (R) thigh  . HYPERTENSION   . HYPERLIPIDEMIA, BORDERLINE   . GERD   . THYROID NODULE, RIGHT   . Unspecified hypothyroidism    Family History  Problem Relation Age of Onset  . Arthritis Mother   . Arthritis Father   . Breast cancer Sister   . Diabetes Maternal Uncle   . Hypertension Other     parent & grandparent  . Heart disease Other     parent & grandparent  . Colon cancer Neg Hx    History  Substance Use Topics  . Smoking status: Never Smoker   . Smokeless tobacco: Never Used     Comment: Married, 2 grown dtr with g-kids plus 2 adopted children, living at home  . Alcohol Use: Yes     Comment: Occassional    Review of Systems  Constitutional: Negative for fever, chills, activity change and appetite change.  HENT: Negative for congestion, ear pain, postnasal drip, rhinorrhea, sinus pressure and sore throat.   Eyes:  Negative.   Respiratory: Negative for cough, choking, chest tightness and shortness of breath.   Cardiovascular: Negative for chest pain, palpitations and leg swelling.  Gastrointestinal: Positive for diarrhea (chronic). Negative for nausea, vomiting, abdominal pain and abdominal distention.  Endocrine: Negative for cold intolerance and heat intolerance.  Genitourinary: Negative.   Musculoskeletal: Negative for arthralgias, gait problem and joint swelling.  Skin: Negative for color change and rash.  Neurological: Negative.   Hematological: Negative.   Psychiatric/Behavioral: Negative.   All other systems reviewed and are negative.       Objective:   Physical Exam  Constitutional: She is oriented to person, place, and time. She appears well-developed and well-nourished. No distress.  HENT:  Head: Normocephalic and atraumatic.  Right Ear: External ear normal.  Left Ear: External ear normal.  Nose: Nose normal.  Mouth/Throat: Oropharynx is clear and moist. No oropharyngeal exudate.  Eyes: Conjunctivae and EOM are normal. Pupils are equal, round, and reactive to light. Right eye exhibits no discharge. Left eye exhibits no discharge. No scleral icterus.  Neck: Normal range of motion. Neck supple. No thyromegaly present.  Cardiovascular: Normal rate and regular rhythm.   No murmur heard. Pulmonary/Chest: Effort normal and breath sounds normal. No respiratory distress. She has no wheezes.  Abdominal: Soft. Bowel sounds are normal. She exhibits no distension and no mass. There  is no tenderness. There is no rebound and no guarding.  Musculoskeletal: Normal range of motion. She exhibits no edema and no tenderness.  Lymphadenopathy:    She has no cervical adenopathy.  Neurological: She is alert and oriented to person, place, and time.  Skin: Skin is warm and dry. No rash noted. She is not diaphoretic.  Psychiatric: She has a normal mood and affect. Her behavior is normal. Judgment and  thought content normal.    Wt Readings from Last 3 Encounters:  06/28/13 165 lb 1.9 oz (74.898 kg)  06/17/13 167 lb (75.751 kg)  08/05/12 168 lb (76.204 kg)   BP Readings from Last 3 Encounters:  06/28/13 118/80  06/17/13 112/82  08/05/12 114/84   Lab Results  Component Value Date   WBC 6.4 06/22/2013   HGB 14.6 06/22/2013   HCT 42.8 06/22/2013   PLT 230.0 06/22/2013   GLUCOSE 98 06/22/2013   CHOL 222* 06/22/2013   TRIG 80.0 06/22/2013   HDL 49.30 06/22/2013   LDLDIRECT 158.5 06/22/2013   LDLCALC 128* 04/30/2012   ALT 18 06/22/2013   AST 15 06/22/2013   NA 140 06/22/2013   K 4.4 06/22/2013   CL 104 06/22/2013   CREATININE 0.7 06/22/2013   BUN 21 06/22/2013   CO2 30 06/22/2013   TSH 2.99 06/22/2013      Assessment & Plan:   CPX/v70.0 - Patient has been counseled on age-appropriate routine health concerns for screening and prevention. These are reviewed and up-to-date. Immunizations are up-to-date or declined. Labs and last ECG reviewed.  Also see problem list -

## 2013-07-03 DIAGNOSIS — N200 Calculus of kidney: Secondary | ICD-10-CM

## 2013-07-03 HISTORY — DX: Calculus of kidney: N20.0

## 2013-07-05 ENCOUNTER — Emergency Department (HOSPITAL_COMMUNITY): Payer: 59

## 2013-07-05 ENCOUNTER — Emergency Department (HOSPITAL_COMMUNITY)
Admission: EM | Admit: 2013-07-05 | Discharge: 2013-07-05 | Disposition: A | Discharge status: Home / Self Care | Payer: 59 | Attending: Emergency Medicine | Admitting: Emergency Medicine

## 2013-07-05 ENCOUNTER — Encounter (HOSPITAL_COMMUNITY): Payer: Self-pay | Admitting: Emergency Medicine

## 2013-07-05 DIAGNOSIS — E039 Hypothyroidism, unspecified: Secondary | ICD-10-CM | POA: Insufficient documentation

## 2013-07-05 DIAGNOSIS — Z79899 Other long term (current) drug therapy: Secondary | ICD-10-CM | POA: Insufficient documentation

## 2013-07-05 DIAGNOSIS — I10 Essential (primary) hypertension: Secondary | ICD-10-CM | POA: Insufficient documentation

## 2013-07-05 DIAGNOSIS — Z885 Allergy status to narcotic agent status: Secondary | ICD-10-CM | POA: Insufficient documentation

## 2013-07-05 DIAGNOSIS — Z85820 Personal history of malignant melanoma of skin: Secondary | ICD-10-CM | POA: Insufficient documentation

## 2013-07-05 DIAGNOSIS — E041 Nontoxic single thyroid nodule: Secondary | ICD-10-CM | POA: Insufficient documentation

## 2013-07-05 DIAGNOSIS — J45909 Unspecified asthma, uncomplicated: Secondary | ICD-10-CM | POA: Insufficient documentation

## 2013-07-05 DIAGNOSIS — J309 Allergic rhinitis, unspecified: Secondary | ICD-10-CM | POA: Insufficient documentation

## 2013-07-05 DIAGNOSIS — E568 Deficiency of other vitamins: Secondary | ICD-10-CM | POA: Insufficient documentation

## 2013-07-05 DIAGNOSIS — Z8742 Personal history of other diseases of the female genital tract: Secondary | ICD-10-CM | POA: Insufficient documentation

## 2013-07-05 DIAGNOSIS — E785 Hyperlipidemia, unspecified: Secondary | ICD-10-CM | POA: Insufficient documentation

## 2013-07-05 DIAGNOSIS — D573 Sickle-cell trait: Secondary | ICD-10-CM | POA: Insufficient documentation

## 2013-07-05 DIAGNOSIS — N201 Calculus of ureter: Secondary | ICD-10-CM | POA: Insufficient documentation

## 2013-07-05 DIAGNOSIS — N200 Calculus of kidney: Secondary | ICD-10-CM

## 2013-07-05 DIAGNOSIS — K219 Gastro-esophageal reflux disease without esophagitis: Secondary | ICD-10-CM | POA: Insufficient documentation

## 2013-07-05 LAB — COMPREHENSIVE METABOLIC PANEL
ALT: 18 U/L (ref 0–35)
Alkaline Phosphatase: 91 U/L (ref 39–117)
BUN: 24 mg/dL — ABNORMAL HIGH (ref 6–23)
Chloride: 104 mEq/L (ref 96–112)
GFR calc Af Amer: >90 mL/min (ref >90)
GFR calc non Af Amer: >90 mL/min (ref >90)
Glucose, Bld: 119 mg/dL — ABNORMAL HIGH (ref 70–99)
Potassium: 3.7 mEq/L (ref 3.5–5.1)
Total Bilirubin: 0.4 mg/dL (ref 0.3–1.2)
Total Protein: 7.3 g/dL (ref 6.0–8.3)

## 2013-07-05 LAB — CBC WITH DIFFERENTIAL/PLATELET
Basophils Absolute: 0.1 10*3/uL (ref 0.0–0.1)
Eosinophils Relative: 6 % — ABNORMAL HIGH (ref 0–5)
HCT: 39.9 % (ref 36.0–46.0)
Lymphocytes Relative: 28 % (ref 12–46)
Lymphs Abs: 2.2 10*3/uL (ref 0.7–4.0)
MCHC: 35.3 g/dL (ref 30.0–36.0)
MCV: 84 fL (ref 78.0–100.0)
Monocytes Absolute: 0.8 10*3/uL (ref 0.1–1.0)
Neutro Abs: 4.2 10*3/uL (ref 1.7–7.7)
Neutrophils Relative %: 54 % (ref 43–77)
RBC: 4.75 MIL/uL (ref 3.87–5.11)
RDW: 13.4 % (ref 11.5–15.5)
WBC: 7.7 10*3/uL (ref 4.0–10.5)

## 2013-07-05 LAB — URINE MICROSCOPIC-ADD ON

## 2013-07-05 LAB — URINALYSIS, ROUTINE W REFLEX MICROSCOPIC
Bilirubin Urine: NEGATIVE
Ketones, ur: NEGATIVE mg/dL
Nitrite: NEGATIVE
Protein, ur: NEGATIVE mg/dL
Specific Gravity, Urine: 1.014 (ref 1.005–1.030)
Urobilinogen, UA: 0.2 mg/dL (ref 0.0–1.0)
pH: 5 (ref 5.0–8.0)

## 2013-07-05 MED ORDER — TAMSULOSIN HCL 0.4 MG PO CAPS: 0.4000 mg | ORAL_CAPSULE | Freq: Every day | ORAL | Status: DC

## 2013-07-05 MED ORDER — MORPHINE SULFATE 4 MG/ML IJ SOLN
4.0000 mg | Freq: Once | INTRAMUSCULAR | Status: AC
  Administered 2013-07-05: 4 mg via INTRAVENOUS
  Filled 2013-07-05: qty 1

## 2013-07-05 MED ORDER — KETOROLAC TROMETHAMINE 30 MG/ML IJ SOLN
30.0000 mg | Freq: Once | INTRAMUSCULAR | Status: AC
  Administered 2013-07-05: 30 mg via INTRAVENOUS
  Filled 2013-07-05: qty 1

## 2013-07-05 MED ORDER — HYDROMORPHONE HCL PF 1 MG/ML IJ SOLN
1.0000 mg | Freq: Once | INTRAMUSCULAR | Status: AC
  Administered 2013-07-05: 1 mg via INTRAVENOUS
  Filled 2013-07-05: qty 1

## 2013-07-05 MED ORDER — OXYCODONE-ACETAMINOPHEN 5-325 MG PO TABS: 1.0000 | ORAL_TABLET | Freq: Four times a day (QID) | ORAL | Status: DC | PRN

## 2013-07-05 MED ORDER — ONDANSETRON HCL 4 MG/2ML IJ SOLN
4.0000 mg | Freq: Once | INTRAMUSCULAR | Status: AC
  Administered 2013-07-05: 4 mg via INTRAVENOUS
  Filled 2013-07-05: qty 2

## 2013-07-05 NOTE — ED Notes (Signed)
Pt A&Ox4, thanking staff for care, denies pain. Wheeled out of ED by Malachi Bonds.

## 2013-07-05 NOTE — ED Notes (Signed)
Pt c/o pain that start left side mid back, and radiates left flank down to groin. States increased urinary frequency. Denies hematuria. States similar episode when she had a UTI.

## 2013-07-05 NOTE — ED Notes (Signed)
Dr. Walden at bedside 

## 2013-07-05 NOTE — ED Provider Notes (Signed)
CSN: [id number]     Arrival date & time 07/05/13  0431 History   First MD Initiated Contact with Patient 07/05/13 0435     Chief Complaint  Patient presents with  . Flank Pain   (Consider location/radiation/quality/duration/timing/severity/associated sxs/prior Treatment) HPI Comments: Pain radiates to L groin. No instigating, alleviating, exacerbating factors.   Patient is a 57 y.o. female presenting with flank pain. The history is provided by the patient.  Flank Pain This is a new problem. The current episode started 3 to 5 hours ago. The problem occurs constantly. The problem has not changed since onset.Pertinent negatives include no chest pain, no abdominal pain, no headaches and no shortness of breath. Nothing aggravates the symptoms. Nothing relieves the symptoms.    Past Medical History  Diagnosis Date  . Deficiency of other vitamins   . Sickle-cell trait   . Abnormal glandular Papanicolaou smear of cervix   . ALLERGIC RHINITIS, SEASONAL   . ASTHMA   . Melanoma     (R) thigh  . HYPERLIPIDEMIA, BORDERLINE   . GERD   . THYROID NODULE, RIGHT   . Unspecified hypothyroidism   . HYPERTENSION    Past Surgical History  Procedure Laterality Date  . Tonsillectomy      childhood  . Nasal sinus surgery    . Melanoma in situ excision  2010    (R) thigh   . Cholecystectomy  04/2005   Family History  Problem Relation Age of Onset  . Arthritis Mother   . Arthritis Father   . Breast cancer Sister   . Diabetes Maternal Uncle   . Hypertension Other     parent & grandparent  . Heart disease Other     parent & grandparent  . Colon cancer Neg Hx    History  Substance Use Topics  . Smoking status: Never Smoker   . Smokeless tobacco: Never Used     Comment: Married, 2 grown dtr with g-kids plus 2 adopted children, living at home  . Alcohol Use: Yes     Comment: Occassional   OB History   Grav Para Term Preterm Abortions TAB SAB Ect Mult Living                 Review of  Systems  Constitutional: Negative for fever.  Respiratory: Negative for cough and shortness of breath.   Cardiovascular: Negative for chest pain.  Gastrointestinal: Negative for nausea, vomiting and abdominal pain.  Genitourinary: Positive for flank pain.  Neurological: Negative for headaches.  All other systems reviewed and are negative.    Allergies  Droperidol  Home Medications   Current Outpatient Rx  Name  Route  Sig  Dispense  Refill  . CALCIUM-MAGNESIUM PO   Oral   Take by mouth at bedtime.         . cephALEXin (KEFLEX) 500 MG capsule   Oral   Take 1 capsule (500 mg total) by mouth 3 (three) times daily. Begin on Wednesday.   9 capsule   0   . cholecalciferol (VITAMIN D) 1000 UNITS tablet   Oral   Take 1,000 Units by mouth daily.         . hyoscyamine (LEVSIN, ANASPAZ) 0.125 MG tablet   Oral   Take 1 tablet (0.125 mg total) by mouth every 4 (four) hours as needed (bladder spasms).   40 tablet   4   . levothyroxine (SYNTHROID, LEVOTHROID) 137 MCG tablet   Oral   Take 1 tablet (137 mcg  total) by mouth daily.   90 tablet   2   . oxyCODONE-acetaminophen (PERCOCET) 5-325 MG per tablet   Oral   Take 1 tablet by mouth every 6 (six) hours as needed for pain.   30 tablet   0   . phenazopyridine (PYRIDIUM) 100 MG tablet   Oral   Take 1 tablet (100 mg total) by mouth every 8 (eight) hours as needed for pain (Burning urination.  Will turn urine and body fluids orange.).   30 tablet   1   . rosuvastatin (CRESTOR) 10 MG tablet   Oral   Take 1 tablet (10 mg total) by mouth daily.   90 tablet   3   . senna-docusate (SENOKOT S) 8.6-50 MG per tablet   Oral   Take 1 tablet by mouth 2 (two) times daily.   60 tablet   0   . tamsulosin (FLOMAX) 0.4 MG CAPS capsule   Oral   Take 1 capsule (0.4 mg total) by mouth daily.   30 capsule   0    BP 135/72  Pulse 65  Temp(Src) 97.4 F (36.3 C) (Oral)  Resp 20  SpO2 97% Physical Exam  Nursing note and  vitals reviewed. Constitutional: She is oriented to person, place, and time. She appears well-developed and well-nourished. No distress.  HENT:  Head: Normocephalic and atraumatic.  Eyes: EOM are normal. Pupils are equal, round, and reactive to light.  Neck: Normal range of motion. Neck supple.  Cardiovascular: Normal rate and regular rhythm.  Exam reveals no friction rub.   No murmur heard. Pulmonary/Chest: Effort normal and breath sounds normal. No respiratory distress. She has no wheezes. She has no rales.  Abdominal: Soft. She exhibits no distension. There is no tenderness. There is no rebound.  Musculoskeletal: Normal range of motion. She exhibits no edema.  Neurological: She is alert and oriented to person, place, and time.  Skin: No rash noted. She is not diaphoretic.    ED Course  Procedures (including critical care time) Labs Review Labs Reviewed  URINALYSIS, ROUTINE W REFLEX MICROSCOPIC - Abnormal; Notable for the following:    Hgb urine dipstick SMALL (*)    All other components within normal limits  COMPREHENSIVE METABOLIC PANEL - Abnormal; Notable for the following:    Glucose, Bld 119 (*)    BUN 24 (*)    All other components within normal limits  CBC WITH DIFFERENTIAL - Abnormal; Notable for the following:    Eosinophils Relative 6 (*)    All other components within normal limits  URINE MICROSCOPIC-ADD ON   Imaging Review No results found.  EKG Interpretation   None       MDM   1. Kidney stone     57 year old female presents with left-sided flank pain. Acute onset earlier this morning. No instigating, alleviating, exacerbating factors. States she cannot get comfortable pain. It radiates down her left groin. She denies any fevers, vomiting, diarrhea, hematuria, vaginal bleeding, discharge. Has no other abdominal pain. She has no history of kidney stones. Her vitals are stable. She has no CVA tenderness. She has a benign abdomen. Her story is consistent with  possible kidney stone. We'll check labs and urine CT without contrast. Morphine given.  CT shows 7 mm in distal L ureter. Urology will f/u in their office. Given pain meds. Given dilaudid and toradol.  CT reviewed by me.   Dagmar Hait, MD 07/07/13 [phone]

## 2013-07-06 ENCOUNTER — Encounter (HOSPITAL_COMMUNITY): Payer: Self-pay | Admitting: *Deleted

## 2013-07-06 ENCOUNTER — Other Ambulatory Visit: Payer: Self-pay | Admitting: Urology

## 2013-07-06 ENCOUNTER — Encounter (HOSPITAL_COMMUNITY): Payer: 59 | Admitting: Anesthesiology

## 2013-07-06 ENCOUNTER — Ambulatory Visit (HOSPITAL_COMMUNITY)
Admission: RE | Admit: 2013-07-06 | Discharge: 2013-07-06 | Disposition: A | Discharge status: Home / Self Care | Payer: 59 | Source: Ambulatory Visit | Attending: Urology | Admitting: Urology

## 2013-07-06 ENCOUNTER — Encounter (HOSPITAL_COMMUNITY)
Admission: RE | Disposition: A | Discharge status: Home / Self Care | Payer: Self-pay | Source: Ambulatory Visit | Attending: Urology

## 2013-07-06 ENCOUNTER — Ambulatory Visit (HOSPITAL_COMMUNITY): Payer: 59 | Admitting: Anesthesiology

## 2013-07-06 DIAGNOSIS — N200 Calculus of kidney: Secondary | ICD-10-CM | POA: Insufficient documentation

## 2013-07-06 DIAGNOSIS — I10 Essential (primary) hypertension: Secondary | ICD-10-CM | POA: Insufficient documentation

## 2013-07-06 DIAGNOSIS — J45909 Unspecified asthma, uncomplicated: Secondary | ICD-10-CM | POA: Insufficient documentation

## 2013-07-06 DIAGNOSIS — D571 Sickle-cell disease without crisis: Secondary | ICD-10-CM | POA: Insufficient documentation

## 2013-07-06 DIAGNOSIS — K219 Gastro-esophageal reflux disease without esophagitis: Secondary | ICD-10-CM | POA: Insufficient documentation

## 2013-07-06 DIAGNOSIS — Z85820 Personal history of malignant melanoma of skin: Secondary | ICD-10-CM | POA: Insufficient documentation

## 2013-07-06 DIAGNOSIS — N201 Calculus of ureter: Secondary | ICD-10-CM | POA: Insufficient documentation

## 2013-07-06 DIAGNOSIS — E039 Hypothyroidism, unspecified: Secondary | ICD-10-CM | POA: Insufficient documentation

## 2013-07-06 HISTORY — PX: CYSTOSCOPY WITH RETROGRADE PYELOGRAM, URETEROSCOPY AND STENT PLACEMENT: SHX5789

## 2013-07-06 HISTORY — PX: HOLMIUM LASER APPLICATION: SHX5852

## 2013-07-06 SURGERY — CYSTOURETEROSCOPY, WITH RETROGRADE PYELOGRAM AND STENT INSERTION
Anesthesia: General | Laterality: Left | Wound class: Clean Contaminated

## 2013-07-06 MED ORDER — BELLADONNA ALKALOIDS-OPIUM 16.2-60 MG RE SUPP
RECTAL | Status: DC | PRN
Start: 2013-07-06 — End: 2013-07-06
  Administered 2013-07-06: 1 via RECTAL

## 2013-07-06 MED ORDER — ONDANSETRON HCL 4 MG/2ML IJ SOLN
4.0000 mg | Freq: Once | INTRAMUSCULAR | Status: AC
  Administered 2013-07-06: 4 mg via INTRAVENOUS

## 2013-07-06 MED ORDER — FENTANYL CITRATE 0.05 MG/ML IJ SOLN: 25.0000 ug | INTRAMUSCULAR | Status: DC | PRN

## 2013-07-06 MED ORDER — SODIUM CHLORIDE 0.9 % IR SOLN
Status: DC | PRN
  Administered 2013-07-06: 3000 mL

## 2013-07-06 MED ORDER — PHENAZOPYRIDINE HCL 100 MG PO TABS: 100.0000 mg | ORAL_TABLET | Freq: Three times a day (TID) | ORAL | Status: DC | PRN

## 2013-07-06 MED ORDER — FENTANYL CITRATE 0.05 MG/ML IJ SOLN
INTRAMUSCULAR | Status: AC
  Filled 2013-07-06: qty 2

## 2013-07-06 MED ORDER — SENNOSIDES-DOCUSATE SODIUM 8.6-50 MG PO TABS: 1.0000 | ORAL_TABLET | Freq: Two times a day (BID) | ORAL | Status: DC

## 2013-07-06 MED ORDER — METOCLOPRAMIDE HCL 5 MG/ML IJ SOLN
INTRAMUSCULAR | Status: DC | PRN
  Administered 2013-07-06: 10 mg via INTRAVENOUS

## 2013-07-06 MED ORDER — FENTANYL CITRATE 0.05 MG/ML IJ SOLN
INTRAMUSCULAR | Status: DC | PRN
  Administered 2013-07-06: 50 ug via INTRAVENOUS

## 2013-07-06 MED ORDER — ONDANSETRON HCL 4 MG/2ML IJ SOLN
INTRAMUSCULAR | Status: AC
  Filled 2013-07-06: qty 2

## 2013-07-06 MED ORDER — FENTANYL CITRATE 0.05 MG/ML IJ SOLN
50.0000 ug | INTRAMUSCULAR | Status: DC | PRN
  Administered 2013-07-06: 50 ug via INTRAVENOUS

## 2013-07-06 MED ORDER — SUCCINYLCHOLINE CHLORIDE 20 MG/ML IJ SOLN: INTRAMUSCULAR | Status: DC | PRN

## 2013-07-06 MED ORDER — BELLADONNA ALKALOIDS-OPIUM 16.2-60 MG RE SUPP
RECTAL | Status: AC
  Filled 2013-07-06: qty 1

## 2013-07-06 MED ORDER — DEXAMETHASONE SODIUM PHOSPHATE 10 MG/ML IJ SOLN
INTRAMUSCULAR | Status: DC | PRN
  Administered 2013-07-06: 10 mg via INTRAVENOUS

## 2013-07-06 MED ORDER — CISATRACURIUM BESYLATE (PF) 10 MG/5ML IV SOLN
INTRAVENOUS | Status: DC | PRN
  Administered 2013-07-06: 6 mg via INTRAVENOUS

## 2013-07-06 MED ORDER — PHENYLEPHRINE HCL 10 MG/ML IJ SOLN
INTRAMUSCULAR | Status: DC | PRN
  Administered 2013-07-06 (×4): 40 ug via INTRAVENOUS

## 2013-07-06 MED ORDER — NEOSTIGMINE METHYLSULFATE 1 MG/ML IJ SOLN
INTRAMUSCULAR | Status: DC | PRN
  Administered 2013-07-06: 5 mg via INTRAVENOUS

## 2013-07-06 MED ORDER — LACTATED RINGERS IV SOLN
INTRAVENOUS | Status: DC
  Administered 2013-07-06: 13:00:00 via INTRAVENOUS

## 2013-07-06 MED ORDER — LIDOCAINE HCL 2 % EX GEL
CUTANEOUS | Status: AC
  Filled 2013-07-06: qty 10

## 2013-07-06 MED ORDER — MIDAZOLAM HCL 5 MG/5ML IJ SOLN
INTRAMUSCULAR | Status: DC | PRN
  Administered 2013-07-06: 1 mg via INTRAVENOUS

## 2013-07-06 MED ORDER — PROMETHAZINE HCL 25 MG/ML IJ SOLN: 6.2500 mg | INTRAMUSCULAR | Status: DC | PRN

## 2013-07-06 MED ORDER — HYOSCYAMINE SULFATE 0.125 MG PO TABS: 0.1250 mg | ORAL_TABLET | ORAL | Status: DC | PRN

## 2013-07-06 MED ORDER — GLYCOPYRROLATE 0.2 MG/ML IJ SOLN
INTRAMUSCULAR | Status: DC | PRN
  Administered 2013-07-06: .8 mg via INTRAVENOUS

## 2013-07-06 MED ORDER — CEFAZOLIN SODIUM-DEXTROSE 2-3 GM-% IV SOLR
2.0000 g | INTRAVENOUS | Status: AC
  Administered 2013-07-06: 2 g via INTRAVENOUS

## 2013-07-06 MED ORDER — LACTATED RINGERS IV SOLN
INTRAVENOUS | Status: DC | PRN
  Administered 2013-07-06 (×2): via INTRAVENOUS

## 2013-07-06 MED ORDER — LIDOCAINE HCL 2 % EX GEL
CUTANEOUS | Status: DC | PRN
  Administered 2013-07-06: 1 via URETHRAL

## 2013-07-06 MED ORDER — CEPHALEXIN 500 MG PO CAPS: 500.0000 mg | ORAL_CAPSULE | Freq: Three times a day (TID) | ORAL | Status: DC

## 2013-07-06 MED ORDER — IOHEXOL 300 MG/ML  SOLN
INTRAMUSCULAR | Status: DC | PRN
  Administered 2013-07-06: 10 mL

## 2013-07-06 MED ORDER — SUCCINYLCHOLINE CHLORIDE 20 MG/ML IJ SOLN
INTRAMUSCULAR | Status: DC | PRN
  Administered 2013-07-06: 100 mg via INTRAVENOUS

## 2013-07-06 MED ORDER — EPHEDRINE SULFATE 50 MG/ML IJ SOLN
INTRAMUSCULAR | Status: DC | PRN
  Administered 2013-07-06 (×3): 5 mg via INTRAVENOUS

## 2013-07-06 MED ORDER — SODIUM CHLORIDE 0.9 % IR SOLN
Status: DC | PRN
  Administered 2013-07-06: 1000 mL

## 2013-07-06 MED ORDER — PROPOFOL 10 MG/ML IV BOLUS
INTRAVENOUS | Status: DC | PRN
  Administered 2013-07-06: 180 mg via INTRAVENOUS

## 2013-07-06 MED ORDER — LIDOCAINE HCL (CARDIAC) 20 MG/ML IV SOLN
INTRAVENOUS | Status: DC | PRN
  Administered 2013-07-06: 75 mg via INTRAVENOUS

## 2013-07-06 MED ORDER — 0.9 % SODIUM CHLORIDE (POUR BTL) OPTIME
TOPICAL | Status: DC | PRN
  Administered 2013-07-06: 1000 mL

## 2013-07-06 MED ORDER — CEFAZOLIN SODIUM-DEXTROSE 2-3 GM-% IV SOLR
INTRAVENOUS | Status: AC
  Filled 2013-07-06: qty 50

## 2013-07-06 SURGICAL SUPPLY — 34 items
BAG URO CATCHER STRL LF (DRAPE) ×2 IMPLANT
BASKET LASER NITINOL 1.9FR (BASKET) IMPLANT
BASKET STNLS GEMINI 4WIRE 3FR (BASKET) IMPLANT
BASKET ZERO TIP NITINOL 2.4FR (BASKET) ×2 IMPLANT
BRUSH URET BIOPSY 3F (UROLOGICAL SUPPLIES) IMPLANT
CATH CLEAR GEL 3F BACKSTOP (CATHETERS) ×2 IMPLANT
CATH URET 5FR 28IN CONE TIP (BALLOONS)
CATH URET 5FR 28IN OPEN ENDED (CATHETERS) ×2 IMPLANT
CATH URET 5FR 70CM CONE TIP (BALLOONS) IMPLANT
CATH URET DUAL LUMEN 6-10FR 50 (CATHETERS) IMPLANT
CLOTH BEACON ORANGE TIMEOUT ST (SAFETY) ×2 IMPLANT
DRAPE CAMERA CLOSED 9X96 (DRAPES) ×2 IMPLANT
FIBER LASER FLEXIVA 200 (UROLOGICAL SUPPLIES) ×2 IMPLANT
FIBER LASER FLEXIVA 365 (UROLOGICAL SUPPLIES) IMPLANT
GLOVE BIOGEL M 7.0 STRL (GLOVE) IMPLANT
GLOVE ECLIPSE 7.0 STRL STRAW (GLOVE) ×2 IMPLANT
GOWN PREVENTION PLUS LG XLONG (DISPOSABLE) IMPLANT
GOWN STRL REIN XL XLG (GOWN DISPOSABLE) ×4 IMPLANT
GUIDEWIRE ANG ZIPWIRE 038X150 (WIRE) IMPLANT
GUIDEWIRE STR DUAL SENSOR (WIRE) ×2 IMPLANT
IV NS IRRIG 3000ML ARTHROMATIC (IV SOLUTION) IMPLANT
KIT BALLIN UROMAX 15FX10 (LABEL) IMPLANT
KIT BALLN UROMAX 15FX4 (MISCELLANEOUS) IMPLANT
KIT BALLN UROMAX 26 75X4 (MISCELLANEOUS)
MANIFOLD NEPTUNE II (INSTRUMENTS) ×2 IMPLANT
PACK CYSTO (CUSTOM PROCEDURE TRAY) ×2 IMPLANT
SCRUB PCMX 4 OZ (MISCELLANEOUS) IMPLANT
SET HIGH PRES BAL DIL (LABEL)
SHEATH ACCESS URETERAL 38CM (SHEATH) IMPLANT
SHEATH URET ACCESS 12FR/35CM (UROLOGICAL SUPPLIES) ×2 IMPLANT
SHEATH URET ACCESS 12FR/55CM (UROLOGICAL SUPPLIES) IMPLANT
STENT POLARIS 5FRX24 (STENTS) ×2 IMPLANT
SYRINGE IRR TOOMEY STRL 70CC (SYRINGE) ×2 IMPLANT
TUBING CONNECTING 10 (TUBING) ×2 IMPLANT

## 2013-07-06 NOTE — H&P (Signed)
Urology History and Physical Exam  CC: Left distal ureter stone.  HPI: 57 year old female presents for a ureter stone. This was discovered on CT for left flank pain. She has multiple kidney stones bilaterally. This stone causing symptoms is in the left distal ureter. It is 7mm in size. Pain is improved w/ narcotic. UA 07/05/13 in the ER was negative for signs of infection. I saw her in clinic yesterday where we reviewed the risks/benefits/SEs/alternatives/likelihood of achieving goals.  She has elected to proceed with cystoscopy, left ureteroscopy, possible left retrograde pyelogram and ureter stent. I have made it clear to her that there is a possibility of not being able to complete ureteroscopy today which would mean stent placement with interval ureteroscopy.  PMH: Past Medical History  Diagnosis Date  . Deficiency of other vitamins   . Sickle-cell trait   . Abnormal glandular Papanicolaou smear of cervix   . ALLERGIC RHINITIS, SEASONAL   . ASTHMA   . Melanoma     (R) thigh  . HYPERTENSION   . HYPERLIPIDEMIA, BORDERLINE   . GERD   . THYROID NODULE, RIGHT   . Unspecified hypothyroidism     PSH: Past Surgical History  Procedure Laterality Date  . Tonsillectomy      childhood  . Nasal sinus surgery    . Melanoma in situ excision  2010    (R) thigh   . Cholecystectomy  04/2005    Allergies: No Known Allergies  Medications: No prescriptions prior to admission     Social History: History   Social History  . Marital Status: Married    Spouse Name: N/A    Number of Children: N/A  . Years of Education: N/A   Occupational History  . Not on file.   Social History Main Topics  . Smoking status: Never Smoker   . Smokeless tobacco: Never Used     Comment: Married, 2 grown dtr with g-kids plus 2 adopted children, living at home  . Alcohol Use: Yes     Comment: Occassional  . Drug Use: No  . Sexual Activity: Not on file   Other Topics Concern  . Not on file    Social History Narrative  . No narrative on file    Family History: Family History  Problem Relation Age of Onset  . Arthritis Mother   . Arthritis Father   . Breast cancer Sister   . Diabetes Maternal Uncle   . Hypertension Other     parent & grandparent  . Heart disease Other     parent & grandparent  . Colon cancer Neg Hx     Review of Systems: Positive: Nausea, left flank pain. Negative: Fever, SOB, or chest pain.  A further 10 point review of systems was negative except what is listed in the HPI.  Physical Exam: Filed Vitals:   07/06/13 1019  BP: 122/72  Pulse: 79  Temp: 98.6 F (37 C)  Resp: 20    General: No acute distress.  Awake. Head:  Normocephalic.  Atraumatic. ENT:  EOMI.  Mucous membranes moist Neck:  Supple.  No lymphadenopathy. CV:  S1 present. S2 present. Regular rate. Pulmonary: Equal effort bilaterally.  Clear to auscultation bilaterally. Abdomen: Soft.  Non- tender to palpation. Skin:  Normal turgor.  No visible rash. Extremity: No gross deformity of bilateral upper extremities.  No gross deformity of    bilateral lower extremities. Neurologic: Alert. Appropriate mood.    Studies:  Recent Labs  07/05/13  0445  HGB  14.1  WBC  7.7  PLT  211    Recent Labs     07/05/13  0445  NA  139  K  3.7  CL  104  CO2  24  BUN  24*  CREATININE  0.71  CALCIUM  9.7  GFRNONAA  >90  GFRAA  >90     No results found for this basename: PT, INR, APTT,  in the last 72 hours   No components found with this basename: ABG,     Assessment:  Left distal ureter stone.  Plan: To OR for cystoscopy, left ureteroscopy, possible left retrograde pyelogram and ureter stent.

## 2013-07-06 NOTE — Progress Notes (Signed)
Patient denies high blood pressure.

## 2013-07-06 NOTE — Anesthesia Procedure Notes (Signed)
Procedure Name: LMA Insertion Date/Time: 07/06/2013 2:07 PM Performed by: Edison Pace Pre-anesthesia Checklist: Patient identified, Timeout performed, Emergency Drugs available, Suction available and Patient being monitored Patient Re-evaluated:Patient Re-evaluated prior to inductionOxygen Delivery Method: Circle system utilized Preoxygenation: Pre-oxygenation with 100% oxygen Intubation Type: IV induction LMA: LMA with gastric port inserted LMA Size: 4.0 Number of attempts: 1 Tube secured with: Tape Dental Injury: Teeth and Oropharynx as per pre-operative assessment

## 2013-07-06 NOTE — Anesthesia Postprocedure Evaluation (Signed)
  Anesthesia Post-op Note  Patient: Lindsey Ferrell  Procedure(s) Performed: Procedure(s) (LRB): CYSTOSCOPY WITH RETROGRADE PYELOGRAM, URETEROSCOPY AND STENT PLACEMENT (Left) HOLMIUM LASER APPLICATION (Left)  Patient Location: PACU  Anesthesia Type: General  Level of Consciousness: awake and alert   Airway and Oxygen Therapy: Patient Spontanous Breathing  Post-op Pain: mild  Post-op Assessment: Post-op Vital signs reviewed, Patient's Cardiovascular Status Stable, Respiratory Function Stable, Patent Airway and No signs of Nausea or vomiting  Last Vitals:  Filed Vitals:   07/06/13 1545  BP: 129/65  Pulse: 78  Temp:   Resp: 12    Post-op Vital Signs: stable   Complications: No apparent anesthesia complications

## 2013-07-06 NOTE — Op Note (Signed)
Urology Operative Report  Date of Procedure: 07/06/13  Surgeon: Natalia Leatherwood, MD Assistant:  None  Preoperative Diagnosis: Left distal ureter stone. Postoperative Diagnosis:  Same  Procedure(s): Left ureteroscopy with laser lithotripsy and stone removal. Left retrograde pyelogram. Left ureter stent placement. Cystoscopy.  Estimated blood loss: Minimal  Specimen: Stones sent for chemical analysis at [facility] lab.  Drains: None  Complications: None  Findings: Left distal ureter stone. No other ureter stones visualized.  History of present illness: 57 year old female presents with a systematic left distal ureter stone. She elected to proceed with ureteroscopy.   Procedure in detail: After informed consent was obtained, the patient was taken to the operating room. They were placed in the supine position. SCDs were turned on and in place. IV antibiotics were infused, and general anesthesia was induced. A timeout was performed in which the correct patient, surgical site, and procedure were identified and agreed upon by the team.  The patient was placed in a dorsolithotomy position, making sure to pad all pertinent neurovascular pressure points. The genitals were prepped and draped in the usual sterile fashion.  A rigid cystoscope was advanced through the urethra and into the bladder. The bladder was drained and then fully distended. It was evaluated in a systematic fashion to visualize the entire surface of the bladder. This was negative for tumors. Attention was turned the left ureter orifice. It was cannulated with a 5 [place] ureter catheter and I injected contrast to obtain a retrograde pyelogram. There was noted to be a filling defect consistent with a left distal ureter stone in the distal ureter. There was no hydronephrosis or hydroureter or other filling defects in the ureter.  A sensor wire was placed into the distal ureter with ease and into the left renal pelvis on fluoroscopy.  This was secured to the drape as a safety wire. The patient was paralyzed. A semirigid ureteroscope was advanced through urethra and into the bladder. As able to place this into the distal ureter but was not able to navigate pass an area of stenosis before reaching the stone. Therefore I removed the ureter scope and placed the obturator to a 12-14 French ureter access sheath over the safety wire under fluoroscopy. This placed with ease. I then secured the safety wire into the drape and replaced the semirigid ureteroscope with ease. The stone was visualized. I then placed the backstop gel proximal to the stone to prevent proximal migration. Lithotripsy was then carried out with a 200  holmium laser filament set at 0.5 J and 20 Hz. The stone was fragmented into small pieces and these were removed and placed in the bladder. I then navigated the scope up to the ureteropelvic junction where there was some redundant tissue but no outright blockage. No other stones were visualized. I then washed the large stone fragments out of the bladder but there were some small fragments that were difficult to washed out that she will likely void out. I then placed a Polaris 5 x 24 stent with the tethering string up over the safety wire with a good curl deployed in the left renal pelvis and the loops in the bladder. The bladder was drained. I then placed 10 cc of lidocaine jelly into the urethra and a belladonna and opium suppository into the rectum.  This completed the procedure. The patient was placed back in a supine position, anesthesia was reversed, and she was taken to the [facility] in stable condition.  All counts were correct at the end  of the case.

## 2013-07-06 NOTE — Transfer of Care (Signed)
Immediate Anesthesia Transfer of Care Note  Patient: Lindsey Ferrell  Procedure(s) Performed: Procedure(s): CYSTOSCOPY WITH RETROGRADE PYELOGRAM, URETEROSCOPY AND STENT PLACEMENT (Left) HOLMIUM LASER APPLICATION (Left)  Patient Location: PACU  Anesthesia Type:General  Level of Consciousness: awake, oriented, patient cooperative, lethargic and responds to stimulation  Airway & Oxygen Therapy: Patient Spontanous Breathing and Patient connected to face mask oxygen  Post-op Assessment: Report given to PACU RN, Post -op Vital signs reviewed and stable and Patient moving all extremities  Post vital signs: Reviewed and stable  Complications: No apparent anesthesia complications

## 2013-07-06 NOTE — Preoperative (Signed)
Beta Blockers   Reason not to administer Beta Blockers:Not Applicable 

## 2013-07-06 NOTE — Anesthesia Preprocedure Evaluation (Addendum)
Anesthesia Evaluation  Patient identified by MRN, date of birth, ID band Patient awake    Reviewed: Allergy & Precautions, H&P , NPO status , Patient's Chart, lab work & pertinent test results  Airway Mallampati: II TM Distance: >3 FB Neck ROM: Full    Dental no notable dental hx.    Pulmonary asthma ,  breath sounds clear to auscultation  Pulmonary exam normal       Cardiovascular Exercise Tolerance: Good hypertension, Rhythm:Regular Rate:Normal     Neuro/Psych PSYCHIATRIC DISORDERS Depression negative neurological ROS  negative psych ROS   GI/Hepatic negative GI ROS, Neg liver ROS, GERD-  Medicated,  Endo/Other  negative endocrine ROSHypothyroidism   Renal/GU negative Renal ROS  negative genitourinary   Musculoskeletal negative musculoskeletal ROS (+)   Abdominal   Peds negative pediatric ROS (+)  Hematology negative hematology ROS (+) sickle cell trait   Anesthesia Other Findings   Reproductive/Obstetrics negative OB ROS                          Anesthesia Physical Anesthesia Plan  ASA: III  Anesthesia Plan: General   Post-op Pain Management:    Induction: Intravenous  Airway Management Planned: LMA  Additional Equipment:   Intra-op Plan:   Post-operative Plan: Extubation in OR  Informed Consent: I have reviewed the patients History and Physical, chart, labs and discussed the procedure including the risks, benefits and alternatives for the proposed anesthesia with the patient or authorized representative who has indicated his/her understanding and acceptance.   Dental advisory given  Plan Discussed with: CRNA and Surgeon  Anesthesia Plan Comments:        Anesthesia Quick Evaluation

## 2013-07-07 ENCOUNTER — Encounter (HOSPITAL_COMMUNITY): Payer: Self-pay | Admitting: Urology

## 2013-07-08 ENCOUNTER — Encounter (HOSPITAL_COMMUNITY)
Admission: AD | Disposition: A | Discharge status: Home / Self Care | Payer: Self-pay | Source: Ambulatory Visit | Attending: Urology

## 2013-07-08 ENCOUNTER — Encounter (HOSPITAL_COMMUNITY): Payer: 59 | Admitting: Anesthesiology

## 2013-07-08 ENCOUNTER — Other Ambulatory Visit: Payer: Self-pay | Admitting: Urology

## 2013-07-08 ENCOUNTER — Ambulatory Visit (HOSPITAL_COMMUNITY): Payer: 59 | Admitting: Anesthesiology

## 2013-07-08 ENCOUNTER — Ambulatory Visit (HOSPITAL_COMMUNITY)
Admission: AD | Admit: 2013-07-08 | Discharge: 2013-07-08 | Disposition: A | Discharge status: Home / Self Care | Payer: 59 | Source: Ambulatory Visit | Attending: Urology | Admitting: Urology

## 2013-07-08 ENCOUNTER — Encounter (HOSPITAL_COMMUNITY): Payer: Self-pay | Admitting: *Deleted

## 2013-07-08 DIAGNOSIS — Z79899 Other long term (current) drug therapy: Secondary | ICD-10-CM | POA: Insufficient documentation

## 2013-07-08 DIAGNOSIS — R109 Unspecified abdominal pain: Secondary | ICD-10-CM | POA: Insufficient documentation

## 2013-07-08 DIAGNOSIS — N1330 Unspecified hydronephrosis: Secondary | ICD-10-CM | POA: Insufficient documentation

## 2013-07-08 DIAGNOSIS — E7800 Pure hypercholesterolemia, unspecified: Secondary | ICD-10-CM | POA: Insufficient documentation

## 2013-07-08 DIAGNOSIS — N200 Calculus of kidney: Secondary | ICD-10-CM | POA: Insufficient documentation

## 2013-07-08 DIAGNOSIS — E039 Hypothyroidism, unspecified: Secondary | ICD-10-CM | POA: Insufficient documentation

## 2013-07-08 HISTORY — PX: CYSTOSCOPY WITH RETROGRADE PYELOGRAM, URETEROSCOPY AND STENT PLACEMENT: SHX5789

## 2013-07-08 SURGERY — CYSTOURETEROSCOPY, WITH RETROGRADE PYELOGRAM AND STENT INSERTION
Anesthesia: General | Site: Ureter | Laterality: Left | Wound class: Clean Contaminated

## 2013-07-08 MED ORDER — LIDOCAINE HCL 2 % EX GEL
CUTANEOUS | Status: AC
  Filled 2013-07-08: qty 10

## 2013-07-08 MED ORDER — CIPROFLOXACIN IN D5W 400 MG/200ML IV SOLN
INTRAVENOUS | Status: AC
  Filled 2013-07-08: qty 200

## 2013-07-08 MED ORDER — BELLADONNA ALKALOIDS-OPIUM 16.2-60 MG RE SUPP
RECTAL | Status: DC | PRN
Start: 2013-07-08 — End: 2013-07-08
  Administered 2013-07-08: 1 via RECTAL

## 2013-07-08 MED ORDER — LIDOCAINE HCL 2 % EX GEL
CUTANEOUS | Status: DC | PRN
  Administered 2013-07-08: 1 via URETHRAL

## 2013-07-08 MED ORDER — DEXAMETHASONE SODIUM PHOSPHATE 10 MG/ML IJ SOLN
INTRAMUSCULAR | Status: DC | PRN
  Administered 2013-07-08: 8 mg via INTRAVENOUS

## 2013-07-08 MED ORDER — SUCCINYLCHOLINE CHLORIDE 20 MG/ML IJ SOLN
INTRAMUSCULAR | Status: DC | PRN
  Administered 2013-07-08: 80 mg via INTRAVENOUS

## 2013-07-08 MED ORDER — SODIUM CHLORIDE 0.9 % IR SOLN
Status: DC | PRN
  Administered 2013-07-08: 2000 mL

## 2013-07-08 MED ORDER — BELLADONNA ALKALOIDS-OPIUM 16.2-60 MG RE SUPP
RECTAL | Status: AC
  Filled 2013-07-08: qty 1

## 2013-07-08 MED ORDER — LIDOCAINE HCL (CARDIAC) 20 MG/ML IV SOLN
INTRAVENOUS | Status: DC | PRN
  Administered 2013-07-08: 100 mg via INTRAVENOUS

## 2013-07-08 MED ORDER — PROPOFOL 10 MG/ML IV BOLUS
INTRAVENOUS | Status: DC | PRN
  Administered 2013-07-08: 170 mg via INTRAVENOUS

## 2013-07-08 MED ORDER — KETOROLAC TROMETHAMINE 30 MG/ML IJ SOLN
INTRAMUSCULAR | Status: DC | PRN
  Administered 2013-07-08: 30 mg via INTRAVENOUS

## 2013-07-08 MED ORDER — IOHEXOL 300 MG/ML  SOLN
INTRAMUSCULAR | Status: DC | PRN
  Administered 2013-07-08: 20 mL via URETHRAL

## 2013-07-08 MED ORDER — ONDANSETRON HCL 4 MG/2ML IJ SOLN
INTRAMUSCULAR | Status: DC | PRN
  Administered 2013-07-08: 4 mg via INTRAVENOUS

## 2013-07-08 MED ORDER — FENTANYL CITRATE 0.05 MG/ML IJ SOLN
25.0000 ug | INTRAMUSCULAR | Status: DC | PRN
  Administered 2013-07-08 (×4): 25 ug via INTRAVENOUS

## 2013-07-08 MED ORDER — FENTANYL CITRATE 0.05 MG/ML IJ SOLN
INTRAMUSCULAR | Status: DC | PRN
  Administered 2013-07-08: 100 ug via INTRAVENOUS

## 2013-07-08 MED ORDER — FENTANYL CITRATE 0.05 MG/ML IJ SOLN
INTRAMUSCULAR | Status: AC
  Filled 2013-07-08: qty 2

## 2013-07-08 MED ORDER — LACTATED RINGERS IV SOLN
INTRAVENOUS | Status: DC | PRN
  Administered 2013-07-08: 19:00:00 via INTRAVENOUS

## 2013-07-08 MED ORDER — LACTATED RINGERS IV SOLN: INTRAVENOUS | Status: DC

## 2013-07-08 MED ORDER — CIPROFLOXACIN IN D5W 400 MG/200ML IV SOLN
400.0000 mg | Freq: Once | INTRAVENOUS | Status: AC
  Administered 2013-07-08: 400 mg via INTRAVENOUS

## 2013-07-08 SURGICAL SUPPLY — 14 items
BAG URO CATCHER STRL LF (DRAPE) ×2 IMPLANT
CATH URET 5FR 28IN CONE TIP (BALLOONS) ×1
CATH URET 5FR 70CM CONE TIP (BALLOONS) ×1 IMPLANT
CLOTH BEACON ORANGE TIMEOUT ST (SAFETY) ×2 IMPLANT
DRAPE CAMERA CLOSED 9X96 (DRAPES) ×2 IMPLANT
GLOVE BIOGEL M 8.0 STRL (GLOVE) ×2 IMPLANT
GOWN PREVENTION PLUS XLARGE (GOWN DISPOSABLE) ×2 IMPLANT
GOWN STRL REIN XL XLG (GOWN DISPOSABLE) ×2 IMPLANT
GUIDEWIRE STR DUAL SENSOR (WIRE) ×2 IMPLANT
MANIFOLD NEPTUNE II (INSTRUMENTS) ×2 IMPLANT
PACK CYSTO (CUSTOM PROCEDURE TRAY) ×2 IMPLANT
STENT CONTOUR 6FRX24X.038 (STENTS) ×2 IMPLANT
TUBING CONNECTING 10 (TUBING) ×2 IMPLANT
WIRE COONS/BENSON .038X145CM (WIRE) ×2 IMPLANT

## 2013-07-08 NOTE — Preoperative (Signed)
Beta Blockers   Reason not to administer Beta Blockers:Not Applicable 

## 2013-07-08 NOTE — Transfer of Care (Signed)
Immediate Anesthesia Transfer of Care Note  Patient: Lindsey Ferrell  Procedure(s) Performed: Procedure(s): CYSTOSCOPY WITH LEFT  RETROGRADE PYELOGRAM  AND STENT PLACEMENT (Left)  Patient Location: PACU  Anesthesia Type:General  Level of Consciousness: awake, alert  and oriented  Airway & Oxygen Therapy: Patient Spontanous Breathing and Patient connected to face mask oxygen  Post-op Assessment: Report given to PACU RN and Post -op Vital signs reviewed and stable  Post vital signs: Reviewed and stable  Complications: No apparent anesthesia complications

## 2013-07-08 NOTE — Anesthesia Postprocedure Evaluation (Signed)
  Anesthesia Post-op Note  Patient: Lindsey Ferrell  Procedure(s) Performed: Procedure(s) (LRB): CYSTOSCOPY WITH LEFT  RETROGRADE PYELOGRAM  AND STENT PLACEMENT (Left)  Patient Location: PACU  Anesthesia Type: General  Level of Consciousness: awake and alert   Airway and Oxygen Therapy: Patient Spontanous Breathing  Post-op Pain: mild  Post-op Assessment: Post-op Vital signs reviewed, Patient's Cardiovascular Status Stable, Respiratory Function Stable, Patent Airway and No signs of Nausea or vomiting  Last Vitals:  Filed Vitals:   07/08/13 2045  BP: 126/71  Pulse: 79  Temp:   Resp: 13    Post-op Vital Signs: stable   Complications: No apparent anesthesia complications

## 2013-07-08 NOTE — H&P (Signed)
Chief Complaint  Left flank pain Left hydronephrosis   History of Present Illness  57 year old female presents today for left flank pain.  This began after she removed her left ureter stent.  She had a left ureteroscopy for distal stone earlier this week.  She has multiple left renal stones which we were planning on evaluating and treating further in the future.  The pain is intermittent.  It is sharp in nature.  Looking in the left flank.  It is located   Past Medical History Problems   1. History of Asthma (493.90)  2. History of hypercholesterolemia (V12.29)  3. History of hypothyroidism (V12.29)  Surgical History Problems   1. History of Cholecystectomy  2. History of Cystoscopy With Insertion Of Ureteral Stent Left  3. History of Cystoscopy With Ureteroscopy With Lithotripsy  4. History of Rotator Cuff Repair  5. History of Sinus Surgery  6. History of Tonsillectomy  Current Meds  1. Allegra TABS;  Therapy: (Recorded:06Jun2008) to Recorded  2. Crestor 10 MG Oral Tablet;  Therapy: (Recorded:03Nov2014) to Recorded  3. Oxycodone-Acetaminophen 5-325 MG Oral Tablet;  Therapy: (Recorded:03Nov2014) to Recorded  4. Synthroid TABS;  Therapy: (Recorded:06Jun2008) to Recorded  5. Tamsulosin HCl - 0.4 MG Oral Capsule;  Therapy: (Recorded:03Nov2014) to Recorded  Allergies Medication   1. Inova PADS  Family History Problems   1. Family history of Acute Myocardial Infarction (V17.3) : Mother  2. Family history of Acute Myocardial Infarction (V17.3) : Maternal Grandfather  3. Family history of Asthma (V17.5) : Father  4. Family history of Diabetes Mellitus (V18.0)  5. Family history of Family Health Status Number Of Children   1 SON, 3 DAUGHTERS  6. Family history of Hypertension (V17.49) : Father  7. Family history of Hypertension (V17.49) : Mother  Social History Problems   1. Alcohol Use   2-3 GLASSES OF WINE A MONTH  2. Caffeine Use   1 CUP COFFEE PER DAY  3. Family  history of Death In The Family Father   53  4. Family history of Death In The Family Mother   28  5. Marital History - Currently Married  6. Never smoker (V49.89)  7. Occupation:   SELF-EMPLOYED  8. Denied: Tobacco Use  Vitals Vital Signs [Data Includes: Last 1 Day]  Recorded: 06Nov2014 03:30PM  Blood Pressure: 122 / 76 Temperature: 98.4 F Heart Rate: 71  Results/Data  The following images/tracing/specimen were independently visualized:  Ultrasound of the left kidney and bladder was performed today. This was compared to CT scan from earlier this month. Normal echotexture of the kidney. There are multiple hyperechoic areas in the left kidney which corresponded previously seen stones. She has hydronephrosis in the left proximal ureter is visualized. Residual and the bladder is 175 cc. There are negative for renal masses. There is a small cystic area in the upper pole which is 1 cm. Please refer to technology form for measurements of the kidneys.    Assessment Assessed   1. Ureteral stone (592.1)  2. Left flank pain (789.09)  3. Hydronephrosis, left (591)  End of Encounter Meds  Medication Name Instruction  Allegra TABS (Fexofenadine HCl)   Crestor 10 MG Oral Tablet   Oxycodone-Acetaminophen 5-325 MG Oral Tablet   Synthroid TABS (Levothyroxine Sodium)   Tamsulosin HCl - 0.4 MG Oral Capsule   Ketorolac Tromethamine 30 MG/ML Intramuscular Solution INJECT 30 MG (1 ML) INTRAMUSCULARLY EVERY 6 HOURS.  THE MAXIMUM DAILY DOSE IS 120 MG.   Plan Hydronephrosis, left, Left  flank pain   1. Administer: Ketorolac Tromethamine 30 MG/ML Intramuscular Solution; INJECT 30 MG  (1 ML) INTRAMUSCULARLY EVERY 6 HOURS.  THE MAXIMUM DAILY DOSE IS  120 MG; To Be Done: 06Nov2014  Plan to place stent in left ureter with retrograde pyelogram prior to assess for obstruction versus ureteral edema from previous procedure.  Risk/beneftis of the operation were discussed in detail with the patient and her  husband who agree to proceed with stent placement.

## 2013-07-08 NOTE — Anesthesia Procedure Notes (Signed)
Procedure Name: Intubation Date/Time: 07/08/2013 7:57 PM Performed by: Leroy Libman L Patient Re-evaluated:Patient Re-evaluated prior to inductionOxygen Delivery Method: Circle system utilized Preoxygenation: Pre-oxygenation with 100% oxygen Intubation Type: IV induction, Cricoid Pressure applied and Rapid sequence Laryngoscope Size: Miller and 2 Grade View: Grade I Tube type: Oral Tube size: 7.5 mm Number of attempts: 1 Airway Equipment and Method: Stylet Placement Confirmation: ETT inserted through vocal cords under direct vision,  breath sounds checked- equal and bilateral and positive ETCO2 Secured at: 21 cm Tube secured with: Tape Dental Injury: Teeth and Oropharynx as per pre-operative assessment

## 2013-07-08 NOTE — Op Note (Signed)
Preoperative diagnosis:  1. Left hydronephrosis   Postoperative diagnosis:  1. As above 2. 2 small filling defects in the distal ureter consistent with ureteral stones   Procedure:  1. Cystoscopy 2. Left retrograde pyelogram with interpretation 3. Left ureteral stent placement  Surgeon: Crist Fat, MD  Anesthesia: General  Complications: None  Intraoperative findings: Retrograde pyelogram was performed revealing left hydro-ureteronephrosis, 2 small filling defects in the distal ureter consistent with small calculi.  EBL: Minimal  Specimens: None  Indication: Lindsey Ferrell is a 57 y.o. patient with history of nephrolithiasis. She had a ureteral stone treated recently followed by ureteral stent placement. Her stent was removed and she subsequently developed recurrent left renal colic. An ultrasound was performed showing left hydroureteronephrosis. There was some uncertainty as to whether this was related to periureteral edema or no striking ureteral stones previously seen in the left renal pelvis.  After reviewing the management options for treatment, he elected to proceed with the above surgical procedure(s). We have discussed the potential benefits and risks of the procedure, side effects of the proposed treatment, the likelihood of the patient achieving the goals of the procedure, and any potential problems that might occur during the procedure or recuperation. Informed consent has been obtained.  Description of procedure:  The patient was taken to the operating room and general anesthesia was induced.  The patient was placed in the dorsal lithotomy position, prepped and draped in the usual sterile fashion, and preoperative antibiotics were administered. A preoperative time-out was performed.   A 30 degree 22 French rigid cystoscope was then passed gently through the patient's urethra and into the bladder under visual guidance. A quick 360 cystoscopic evaluation was  performed with no pathologic findings. The left ureteral orifice was then cannulated with a 5 French open-ended ureteral Pollock and a retrograde pyelogram was performed with the above findings. A 0.38 sensor wire was then gently inserted through the catheter and into the left renal pelvis. Pollock catheter was then removed over the wire and a 24 cm x 6 French double-J ureteral stent was passed over the wire and into the left renal pelvis under fluoroscopic guidance. Once a nice curl was noted to be in the left renal pelvis the cystoscope was back to the bladder neck and the remaining stent was pushed into the bladder. Once the stent was in the bladder the wire was removed completely and a nice curl was noted emanating from the left ureteral orifice. The bladder was then emptied, uro-jet was injected into the urethra and a B&O suppository placed in the rectum. The patient was subsequently extubated and returned to the PACU in excellent condition. There no immediate complications.  Disposition: The patient will be followed up by Dr. Margarita Grizzle in clinic to discuss further surgical intervention.  Crist Fat, M.D.

## 2013-07-08 NOTE — Anesthesia Preprocedure Evaluation (Addendum)
Anesthesia Evaluation  Patient identified by MRN, date of birth, ID band Patient awake    Reviewed: Allergy & Precautions, H&P , NPO status , Patient's Chart, lab work & pertinent test results  Airway Mallampati: II TM Distance: >3 FB Neck ROM: full    Dental no notable dental hx. (+) Teeth Intact and Dental Advisory Given   Pulmonary  breath sounds clear to auscultation  Pulmonary exam normal       Cardiovascular Exercise Tolerance: Good Rhythm:regular Rate:Normal     Neuro/Psych negative neurological ROS  negative psych ROS   GI/Hepatic negative GI ROS, Neg liver ROS, GERD-  Controlled,  Endo/Other  negative endocrine ROSHypothyroidism   Renal/GU negative Renal ROS  negative genitourinary   Musculoskeletal   Abdominal   Peds  Hematology negative hematology ROS (+)   Anesthesia Other Findings   Reproductive/Obstetrics negative OB ROS                          Anesthesia Physical Anesthesia Plan  ASA: II  Anesthesia Plan: General   Post-op Pain Management:    Induction: Intravenous, Rapid sequence and Cricoid pressure planned  Airway Management Planned: Oral ETT  Additional Equipment:   Intra-op Plan:   Post-operative Plan: Extubation in OR  Informed Consent: I have reviewed the patients History and Physical, chart, labs and discussed the procedure including the risks, benefits and alternatives for the proposed anesthesia with the patient or authorized representative who has indicated his/her understanding and acceptance.   Dental Advisory Given  Plan Discussed with: CRNA and Surgeon  Anesthesia Plan Comments:        Anesthesia Quick Evaluation

## 2013-07-08 NOTE — Progress Notes (Signed)
Upon assessment, patient had full meal at 1245 today. OR holding desk made aware. To switch patient's surgical time with patient in the ER. Family made aware of change in time of surgery and rationale.

## 2013-07-09 ENCOUNTER — Encounter (HOSPITAL_COMMUNITY): Payer: Self-pay | Admitting: Urology

## 2013-07-12 ENCOUNTER — Other Ambulatory Visit: Payer: Self-pay | Admitting: Urology

## 2013-07-12 NOTE — Progress Notes (Signed)
Completed same day instructions on 07/12/13.  Patient in hospital twice the week of 07/05/2013.  No change in medications or history per patient on 07/12/13.

## 2013-07-15 NOTE — H&P (Signed)
Urology History and Physical Exam  CC: Left nephrolithiasis. Left ureter stones.  HPI: 57 year old female presents for left ureter and left renal stones. She presented on 07/05/13 for symptomatic left distal ureter stone. Was found to have non-obstructing left renal stones at the same time. She underwent left ureteroscopy 07/06/13 for the distal stone with plans to assess and treat renal stones in the future. He stent was removed and she developed flank pain. Ultrasound 07/08/13 revealed left hydronephrosis, and the on call MD, Dr. [name], placed a left ureter stent. Retrograde pyelogram at that time revealed more stones had migrated into the ureter. Urine culture 07/08/13 was negative for growth. She presents today for cystoscopy, left ureteroscopy, laser lithotripsy, left retrograde pyelogram, and left ureter stent exchange.   CT revealed the stone burden to be as follows: Left lower pole: 0.6, 0.5, & 0.4 cm; Left mid pole: 1 cm & 0.7 cm.  PMH: Past Medical History  Diagnosis Date  . Deficiency of other vitamins   . Sickle-cell trait   . Abnormal glandular Papanicolaou smear of cervix   . ALLERGIC RHINITIS, SEASONAL   . ASTHMA   . Melanoma     (R) thigh  . HYPERLIPIDEMIA, BORDERLINE   . GERD   . THYROID NODULE, RIGHT   . Unspecified hypothyroidism   . HYPERTENSION     PSH: Past Surgical History  Procedure Laterality Date  . Tonsillectomy      childhood  . Nasal sinus surgery    . Melanoma in situ excision  2010    (R) thigh   . Cholecystectomy  04/2005  . Cystoscopy with retrograde pyelogram, ureteroscopy and stent placement Left 07/06/2013    Procedure: CYSTOSCOPY WITH RETROGRADE PYELOGRAM, URETEROSCOPY AND STENT PLACEMENT;  Surgeon: Milford Cage, MD;  Location: [facility];  Service: Urology;  Laterality: Left;  . Holmium laser application Left 07/06/2013    Procedure: HOLMIUM LASER APPLICATION;  Surgeon: Milford Cage, MD;  Location: [facility];  Service: Urology;   Laterality: Left;  . Cystoscopy with retrograde pyelogram, ureteroscopy and stent placement Left 07/08/2013    Procedure: CYSTOSCOPY WITH LEFT  RETROGRADE PYELOGRAM  AND STENT PLACEMENT;  Surgeon: Crist Fat, MD;  Location: [facility];  Service: Urology;  Laterality: Left;    Allergies: Allergies  Allergen Reactions  . Droperidol     In combination with Fentanyl as Innovar causing Facial drawing    Medications: No prescriptions prior to admission     Social History: History   Social History  . Marital Status: Married    Spouse Name: N/A    Number of Children: N/A  . Years of Education: N/A   Occupational History  . Not on file.   Social History Main Topics  . Smoking status: Never Smoker   . Smokeless tobacco: Never Used     Comment: Married, 2 grown dtr with g-kids plus 2 adopted children, living at home  . Alcohol Use: Yes     Comment: Occassional  . Drug Use: No  . Sexual Activity: Not on file   Other Topics Concern  . Not on file   Social History Narrative  . No narrative on file    Family History: Family History  Problem Relation Age of Onset  . Arthritis Mother   . Arthritis Father   . Breast cancer Sister   . Diabetes Maternal Uncle   . Hypertension Other     parent & grandparent  . Heart disease Other  parent & grandparent  . Colon cancer Neg Hx     Review of Systems: Positive: None. Negative: Chest pain, SOB, or fever.  A further 10 point review of systems was negative except what is listed in the HPI.  Physical Exam: Filed Vitals:   07/16/13 0534  BP: 121/69  Pulse: 81  Temp: 97.6 F (36.4 C)  Resp: 18    General: No acute distress.  Awake. Head:  Normocephalic.  Atraumatic. ENT:  EOMI.  Mucous membranes moist Neck:  Supple.  No lymphadenopathy. CV:  S1 present. S2 present. Regular rate. Pulmonary: Equal effort bilaterally.  Clear to auscultation bilaterally. Abdomen: Soft.  Non- tender to palpation. Skin:  Normal turgor.   No visible rash. Extremity: No gross deformity of bilateral upper extremities.  No gross deformity of    bilateral lower extremities. Neurologic: Alert. Appropriate mood.    Studies:  No results found for this basename: HGB, WBC, PLT,  in the last 72 hours  No results found for this basename: NA, K, CL, CO2, BUN, CREATININE, CALCIUM, MAGNESIUM, GFRNONAA, GFRAA,  in the last 72 hours   No results found for this basename: PT, INR, APTT,  in the last 72 hours   No components found with this basename: ABG,     Assessment:  Left ureter stones. Left nephrolithiasis.  Plan: To OR for cystoscopy, left ureteroscopy, laser lithotripsy, left retrograde pyelogram, and left ureter stent exchange.

## 2013-07-16 ENCOUNTER — Ambulatory Visit (HOSPITAL_COMMUNITY): Payer: 59 | Admitting: Anesthesiology

## 2013-07-16 ENCOUNTER — Encounter (HOSPITAL_COMMUNITY)
Admission: RE | Disposition: A | Discharge status: Home / Self Care | Payer: Self-pay | Source: Ambulatory Visit | Attending: Urology

## 2013-07-16 ENCOUNTER — Ambulatory Visit (HOSPITAL_COMMUNITY)
Admission: RE | Admit: 2013-07-16 | Discharge: 2013-07-16 | Disposition: A | Discharge status: Home / Self Care | Payer: 59 | Source: Ambulatory Visit | Attending: Urology | Admitting: Urology

## 2013-07-16 ENCOUNTER — Encounter (HOSPITAL_COMMUNITY): Payer: Self-pay | Admitting: *Deleted

## 2013-07-16 ENCOUNTER — Encounter (HOSPITAL_COMMUNITY): Payer: 59 | Admitting: Anesthesiology

## 2013-07-16 DIAGNOSIS — K219 Gastro-esophageal reflux disease without esophagitis: Secondary | ICD-10-CM | POA: Insufficient documentation

## 2013-07-16 DIAGNOSIS — Z9089 Acquired absence of other organs: Secondary | ICD-10-CM | POA: Insufficient documentation

## 2013-07-16 DIAGNOSIS — I10 Essential (primary) hypertension: Secondary | ICD-10-CM | POA: Insufficient documentation

## 2013-07-16 DIAGNOSIS — N1330 Unspecified hydronephrosis: Secondary | ICD-10-CM | POA: Insufficient documentation

## 2013-07-16 DIAGNOSIS — N201 Calculus of ureter: Secondary | ICD-10-CM | POA: Insufficient documentation

## 2013-07-16 DIAGNOSIS — E785 Hyperlipidemia, unspecified: Secondary | ICD-10-CM | POA: Insufficient documentation

## 2013-07-16 DIAGNOSIS — J45909 Unspecified asthma, uncomplicated: Secondary | ICD-10-CM | POA: Insufficient documentation

## 2013-07-16 DIAGNOSIS — N200 Calculus of kidney: Secondary | ICD-10-CM | POA: Insufficient documentation

## 2013-07-16 HISTORY — PX: CYSTOSCOPY WITH RETROGRADE PYELOGRAM, URETEROSCOPY AND STENT PLACEMENT: SHX5789

## 2013-07-16 LAB — BASIC METABOLIC PANEL
BUN: 16 mg/dL (ref 6–23)
Calcium: 9.2 mg/dL (ref 8.4–10.5)
Chloride: 103 mEq/L (ref 96–112)
Creatinine, Ser: 0.55 mg/dL (ref 0.50–1.10)
GFR calc Af Amer: >90 mL/min (ref >90)
GFR calc non Af Amer: >90 mL/min (ref >90)
Sodium: 137 mEq/L (ref 135–145)

## 2013-07-16 LAB — CBC
HCT: 37.9 % (ref 36.0–46.0)
MCHC: 34.3 g/dL (ref 30.0–36.0)
MCV: 82.8 fL (ref 78.0–100.0)
Platelets: 176 10*3/uL (ref 150–400)
RDW: 13.3 % (ref 11.5–15.5)
WBC: 5.6 10*3/uL (ref 4.0–10.5)

## 2013-07-16 LAB — TROPONIN I: Troponin I: <0.3 ng/mL (ref <0.30)

## 2013-07-16 SURGERY — CYSTOURETEROSCOPY, WITH RETROGRADE PYELOGRAM AND STENT INSERTION
Anesthesia: General | Laterality: Left | Wound class: Clean Contaminated

## 2013-07-16 MED ORDER — LIDOCAINE HCL 2 % EX GEL
CUTANEOUS | Status: AC
Start: 2013-07-16 — End: 2013-07-16
  Filled 2013-07-16: qty 10

## 2013-07-16 MED ORDER — LIDOCAINE HCL 2 % EX GEL
CUTANEOUS | Status: DC | PRN
  Administered 2013-07-16: 1 via URETHRAL

## 2013-07-16 MED ORDER — CEPHALEXIN 500 MG PO CAPS: 500.0000 mg | ORAL_CAPSULE | Freq: Three times a day (TID) | ORAL | Status: DC

## 2013-07-16 MED ORDER — FENTANYL CITRATE 0.05 MG/ML IJ SOLN
25.0000 ug | INTRAMUSCULAR | Status: DC | PRN
  Administered 2013-07-16 (×2): 50 ug via INTRAVENOUS

## 2013-07-16 MED ORDER — LIDOCAINE HCL 1 % IJ SOLN
INTRAMUSCULAR | Status: DC | PRN
  Administered 2013-07-16: 50 mg via INTRADERMAL

## 2013-07-16 MED ORDER — SODIUM CHLORIDE 0.9 % IR SOLN
Status: DC | PRN
  Administered 2013-07-16: 4000 mL

## 2013-07-16 MED ORDER — LACTATED RINGERS IV SOLN
INTRAVENOUS | Status: DC | PRN
  Administered 2013-07-16: 07:00:00 via INTRAVENOUS

## 2013-07-16 MED ORDER — DEXAMETHASONE SODIUM PHOSPHATE 10 MG/ML IJ SOLN
INTRAMUSCULAR | Status: DC | PRN
  Administered 2013-07-16: 10 mg via INTRAVENOUS

## 2013-07-16 MED ORDER — BELLADONNA ALKALOIDS-OPIUM 16.2-60 MG RE SUPP
RECTAL | Status: DC | PRN
  Administered 2013-07-16: 1 via RECTAL

## 2013-07-16 MED ORDER — FENTANYL CITRATE 0.05 MG/ML IJ SOLN
INTRAMUSCULAR | Status: AC
  Filled 2013-07-16: qty 2

## 2013-07-16 MED ORDER — SENNOSIDES-DOCUSATE SODIUM 8.6-50 MG PO TABS: 1.0000 | ORAL_TABLET | Freq: Two times a day (BID) | ORAL | Status: DC

## 2013-07-16 MED ORDER — CEFAZOLIN SODIUM-DEXTROSE 2-3 GM-% IV SOLR
INTRAVENOUS | Status: AC
  Filled 2013-07-16: qty 50

## 2013-07-16 MED ORDER — IOHEXOL 350 MG/ML SOLN
INTRAVENOUS | Status: DC | PRN
  Administered 2013-07-16: 10 mL via URETHRAL

## 2013-07-16 MED ORDER — MIDAZOLAM HCL 5 MG/5ML IJ SOLN
INTRAMUSCULAR | Status: DC | PRN
  Administered 2013-07-16: 2 mg via INTRAVENOUS

## 2013-07-16 MED ORDER — BELLADONNA ALKALOIDS-OPIUM 16.2-60 MG RE SUPP
RECTAL | Status: AC
  Filled 2013-07-16: qty 1

## 2013-07-16 MED ORDER — ONDANSETRON HCL 4 MG/2ML IJ SOLN
INTRAMUSCULAR | Status: DC | PRN
  Administered 2013-07-16: 4 mg via INTRAVENOUS

## 2013-07-16 MED ORDER — FENTANYL CITRATE 0.05 MG/ML IJ SOLN
INTRAMUSCULAR | Status: DC | PRN
  Administered 2013-07-16: 50 ug via INTRAVENOUS

## 2013-07-16 MED ORDER — PROPOFOL 10 MG/ML IV BOLUS
INTRAVENOUS | Status: DC | PRN
  Administered 2013-07-16: 150 mg via INTRAVENOUS

## 2013-07-16 MED ORDER — EPHEDRINE SULFATE 50 MG/ML IJ SOLN
INTRAMUSCULAR | Status: DC | PRN
  Administered 2013-07-16: 5 mg via INTRAVENOUS

## 2013-07-16 MED ORDER — CEFAZOLIN SODIUM-DEXTROSE 2-3 GM-% IV SOLR
2.0000 g | INTRAVENOUS | Status: AC
  Administered 2013-07-16: 2 g via INTRAVENOUS

## 2013-07-16 SURGICAL SUPPLY — 33 items
BAG URO CATCHER STRL LF (DRAPE) ×2 IMPLANT
BASKET LASER NITINOL 1.9FR (BASKET) IMPLANT
BASKET STNLS GEMINI 4WIRE 3FR (BASKET) IMPLANT
BASKET ZERO TIP NITINOL 2.4FR (BASKET) ×2 IMPLANT
BRUSH URET BIOPSY 3F (UROLOGICAL SUPPLIES) IMPLANT
CATH CLEAR GEL 3F BACKSTOP (CATHETERS) IMPLANT
CATH URET 5FR 28IN CONE TIP (BALLOONS)
CATH URET 5FR 28IN OPEN ENDED (CATHETERS) ×2 IMPLANT
CATH URET 5FR 70CM CONE TIP (BALLOONS) IMPLANT
CATH URET DUAL LUMEN 6-10FR 50 (CATHETERS) IMPLANT
DRAPE CAMERA CLOSED 9X96 (DRAPES) ×2 IMPLANT
FIBER LASER FLEXIVA 200 (UROLOGICAL SUPPLIES) IMPLANT
FIBER LASER FLEXIVA 365 (UROLOGICAL SUPPLIES) IMPLANT
GLOVE BIOGEL M 7.0 STRL (GLOVE) ×10 IMPLANT
GLOVE ECLIPSE 7.0 STRL STRAW (GLOVE) IMPLANT
GOWN PREVENTION PLUS LG XLONG (DISPOSABLE) ×2 IMPLANT
GOWN STRL REIN XL XLG (GOWN DISPOSABLE) ×6 IMPLANT
GUIDEWIRE ANG ZIPWIRE 038X150 (WIRE) IMPLANT
GUIDEWIRE STR DUAL SENSOR (WIRE) ×2 IMPLANT
IV NS IRRIG 3000ML ARTHROMATIC (IV SOLUTION) IMPLANT
KIT BALLIN UROMAX 15FX10 (LABEL) IMPLANT
KIT BALLN UROMAX 15FX4 (MISCELLANEOUS) IMPLANT
KIT BALLN UROMAX 26 75X4 (MISCELLANEOUS)
MANIFOLD NEPTUNE II (INSTRUMENTS) ×2 IMPLANT
PACK CYSTO (CUSTOM PROCEDURE TRAY) ×2 IMPLANT
SCRUB PCMX 4 OZ (MISCELLANEOUS) ×2 IMPLANT
SET HIGH PRES BAL DIL (LABEL)
SHEATH ACCESS URETERAL 38CM (SHEATH) ×2 IMPLANT
SHEATH URET ACCESS 12FR/35CM (UROLOGICAL SUPPLIES) IMPLANT
SHEATH URET ACCESS 12FR/55CM (UROLOGICAL SUPPLIES) IMPLANT
STENT POLARIS 5FRX24 (STENTS) ×2 IMPLANT
SYRINGE IRR TOOMEY STRL 70CC (SYRINGE) IMPLANT
TUBING CONNECTING 10 (TUBING) ×2 IMPLANT

## 2013-07-16 NOTE — Progress Notes (Signed)
Dr Imagene Riches .here  Pt may be discharged

## 2013-07-16 NOTE — Op Note (Signed)
Urology Operative Report  Date of Procedure: 07/16/13  Surgeon: Natalia Leatherwood, MD Assistant:  None  Preoperative Diagnosis: Left ureter stone. Left nephrolithiasis. Postoperative Diagnosis:  Same  Procedure(s): Cystoscopy Left ureteroscopy for removal of left distal ureter stone. Left ureteronephroscopy for removal of left renal stones. Left retrograde pyelogram with interpretation. Left ureter stent removal. Left ureter stent placement (5 x 24 Polaris, no tether).  Estimated blood loss: None  Specimen: Stones given to patient.   Drains: None  Complications: None  Findings: Left distal ureter stone. Multiple left renal stones. Negative filling defects of the retrograde pyelogram.  History of present illness: 57 year old female presents today for left-sided kidney stones and a left-sided ureter stone. She presented to the ER with a left distal ureter stone which was treated with ureteroscopy and stent. The plan was to treat her kidney stones in the future. After she remove her stent she passed another kidney stone in the distal left ureter. She had a left ureter stent placed and presents today for ureteroscopy to treat her ureter stone and kidney stones.   Procedure in detail: After informed consent was obtained, the patient was taken to the operating room. They were placed in the supine position. SCDs were turned on and in place. IV antibiotics were infused, and general anesthesia was induced. A timeout was performed in which the correct patient, surgical site, and procedure were identified and agreed upon by the team.  The patient was placed in a dorsolithotomy position, making sure to pad all pertinent neurovascular pressure points. A belladonna and opium suppository was placed into the rectum. The genitals were prepped and draped in the usual sterile fashion.  A rigid cystoscope was advanced through the urethra and into the bladder. The bladder was then drained. Attention  was turned to the left ureter orifice. A sensor wire was placed alongside the stent and up into the left renal pelvis on fluoroscopy. The stent was then grasped and removed to the urethra meatus and a second sensor wire was placed through the stent and into the left renal pelvis. The stent was removed. One wire was used as a safety wire all the other was used as a working wire. I then placed the obturator to 12/14 ureter access sheath over the wire under fluoroscopy into the distal ureter. This went with ease. I then placed the sheath and obturator over the wire under fluoroscopy into the distal ureter. The obturator and wire were removed and a flexible digital ureter scope was placed through the access sheath and into the distal ureter. In the distal ureter there was noted to be a left ureter stone. This was grasped with a 0 tip Nitinol basket and removed. I then navigated the rest of the length of the ureter and there were no other stones in the ureter. A second sensor wire was then placed through the ureter scope into the left renal pelvis and the access sheath and ureteroscope were removed.  I then placed the ureter access sheath over the working wire up into the left renal pelvis and removed the obturator and wire. Flexible digital scope was then placed into the kidney and the kidney was evaluated in a systematic fashion to lyse all of the calyces. There were noted to be multiple Randall's plaques and stones. I used a 0 tip Nitinol basket to remove all of the free stones. The larger stone noted on CT scan in the mid pole is actually still in the parenchyma of the kidney. There  were other large Randall's plaques that appeared to show large stones but these were not able to be removed without damage to the parenchyma, therefore these were left behind. After removing 3 stones I then withdrew the ureter scope into the proximal ureter.  On the proximal ureter I injected contrast through the ureter scope to  obtain a retrograde pyelogram and the outline the calyces. I went back for each calyx to ensure there were no other free stones. This was negative for stones. I then withdrew the ureter scope and access sheath and there was no injury noted to the mucosa of the ureter throughout its length.  Given the frequent recent manipulation of her ureter I elected to leave a stent in place and therefore the safety wire was threaded through the cystoscope and a 5 x 24 Polaris stent was placed over the wire under fluoroscopy with ease. The tether had been removed. As a good curl in the left renal pelvis and the loops were in the bladder. The bladder was then drained and I placed 10 cc of lidocaine jelly into the urethra.  She was placed back in a supine position, anesthesia was reversed, and she was taken to the PACU in stable condition.  All counts were correct at the end of the case.  She will be given an antibiotic to take prior to her stent removal in the office.

## 2013-07-16 NOTE — Anesthesia Preprocedure Evaluation (Addendum)
Anesthesia Evaluation  Patient identified by MRN, date of birth, ID band Patient awake    Reviewed: Allergy & Precautions, H&P , NPO status , Patient's Chart, lab work & pertinent test results  Airway Mallampati: II TM Distance: >3 FB Neck ROM: Full    Dental no notable dental hx.    Pulmonary asthma ,  breath sounds clear to auscultation  Pulmonary exam normal       Cardiovascular hypertension, negative cardio ROS  Rhythm:Regular Rate:Normal     Neuro/Psych PSYCHIATRIC DISORDERS Depression negative neurological ROS     GI/Hepatic Neg liver ROS, GERD-  ,  Endo/Other  Hypothyroidism   Renal/GU negative Renal ROS  negative genitourinary   Musculoskeletal negative musculoskeletal ROS (+)   Abdominal   Peds negative pediatric ROS (+)  Hematology negative hematology ROS (+)   Anesthesia Other Findings   Reproductive/Obstetrics negative OB ROS                          Anesthesia Physical Anesthesia Plan  ASA: II  Anesthesia Plan: General   Post-op Pain Management:    Induction: Intravenous  Airway Management Planned: LMA  Additional Equipment:   Intra-op Plan:   Post-operative Plan: Extubation in OR  Informed Consent: I have reviewed the patients History and Physical, chart, labs and discussed the procedure including the risks, benefits and alternatives for the proposed anesthesia with the patient or authorized representative who has indicated his/her understanding and acceptance.   Dental advisory given  Plan Discussed with: CRNA  Anesthesia Plan Comments:         Anesthesia Quick Evaluation

## 2013-07-16 NOTE — Anesthesia Postprocedure Evaluation (Signed)
  Anesthesia Post-op Note  Patient: MACKENSIE PILSON  Procedure(s) Performed: Procedure(s) (LRB): CYSTOSCOPY WITH RETROGRADE PYELOGRAM, URETEROSCOPY AND Stone removal, stent exchange (Left)  Patient Location: PACU  Anesthesia Type: General  Level of Consciousness: awake and alert   Airway and Oxygen Therapy: Patient Spontanous Breathing  Post-op Pain: mild  Post-op Assessment: Post-op Vital signs reviewed, Patient's Cardiovascular Status Stable, Respiratory Function Stable, Patent Airway and No signs of Nausea or vomiting  Last Vitals:  Filed Vitals:   07/16/13 1129  BP: 101/60  Pulse: 78  Temp: 36.7 C  Resp: 16    Post-op Vital Signs: stable   Complications: Mrs. Bruster had upper abdomen/lower chest pain of 4/10 upon arrival in PACU. A repeat ECG was unchanged and without ischemic signs. Her chest pain gradually resolved with Fentanyl, and then abruptly ceased to 0/10. No SOB, No pleuritic pain, no ectopy. VSS stable. Troponin I was normal.  Will discharge home. To return if symptoms recur. Discussed with patient and husband.

## 2013-07-16 NOTE — Transfer of Care (Signed)
Immediate Anesthesia Transfer of Care Note  Patient: Lindsey Ferrell  Procedure(s) Performed: Procedure(s): CYSTOSCOPY WITH RETROGRADE PYELOGRAM, URETEROSCOPY AND Stone removal, stent exchange (Left)  Patient Location: PACU  Anesthesia Type:General  Level of Consciousness: awake, alert  and oriented  Airway & Oxygen Therapy: Patient Spontanous Breathing and Patient connected to face mask oxygen  Post-op Assessment: Report given to PACU RN, Post -op Vital signs reviewed and stable and Patient moving all extremities X 4  Post vital signs: Reviewed and stable  Complications: No apparent anesthesia complications

## 2013-07-16 NOTE — Progress Notes (Signed)
Patient had some chest heaviness while waking up in the PACU. Her discomfort resolved on it's own expediently.  EKG was normal. Troponin was <0.3  Warned her about signs/symptoms of cardiac events, but it looks like this is not one.

## 2013-07-16 NOTE — Preoperative (Signed)
Beta Blockers   Reason not to administer Beta Blockers:Not Applicable 

## 2013-07-18 ENCOUNTER — Other Ambulatory Visit: Payer: Self-pay | Admitting: Internal Medicine

## 2013-07-19 ENCOUNTER — Encounter (HOSPITAL_COMMUNITY): Payer: Self-pay | Admitting: Urology

## 2013-07-28 ENCOUNTER — Ambulatory Visit (INDEPENDENT_AMBULATORY_CARE_PROVIDER_SITE_OTHER): Payer: 59 | Admitting: Internal Medicine

## 2013-07-28 ENCOUNTER — Encounter: Payer: Self-pay | Admitting: Internal Medicine

## 2013-07-28 VITALS — BP 110/72 | HR 72 | Temp 97.9°F | Ht 65.0 in | Wt 165.0 lb

## 2013-07-28 DIAGNOSIS — J320 Chronic maxillary sinusitis: Secondary | ICD-10-CM

## 2013-07-28 DIAGNOSIS — N200 Calculus of kidney: Secondary | ICD-10-CM

## 2013-07-28 MED ORDER — PSEUDOEPHEDRINE HCL 30 MG PO TABS: 30.0000 mg | ORAL_TABLET | ORAL | Status: DC | PRN

## 2013-07-28 MED ORDER — MOXIFLOXACIN HCL 400 MG PO TABS: 400.0000 mg | ORAL_TABLET | Freq: Every day | ORAL | Status: DC

## 2013-07-28 MED ORDER — MOMETASONE FUROATE 50 MCG/ACT NA SUSP: 2.0000 | Freq: Every day | NASAL | Status: DC

## 2013-07-28 NOTE — Patient Instructions (Addendum)
It was good to see you today.  We have reviewed your prior records including labs and tests today  Avelox antibiotics once daily x 10 days - Your prescription(s) have been submitted to your pharmacy. Please take as directed and contact our office if you believe you are having problem(s) with the medication(s).  If unimproved in next 2 weeks, call for CT sinus as discussed  Sinusitis Sinusitis is redness, soreness, and swelling (inflammation) of the paranasal sinuses. Paranasal sinuses are air pockets within the bones of your face (beneath the eyes, the middle of the forehead, or above the eyes). In healthy paranasal sinuses, mucus is able to drain out, and air is able to circulate through them by way of your nose. However, when your paranasal sinuses are inflamed, mucus and air can become trapped. This can allow bacteria and other germs to grow and cause infection. Sinusitis can develop quickly and last only a short time (acute) or continue over a long period (chronic). Sinusitis that lasts for more than 12 weeks is considered chronic.  CAUSES  Causes of sinusitis include:  Allergies.  Structural abnormalities, such as displacement of the cartilage that separates your nostrils (deviated septum), which can decrease the air flow through your nose and sinuses and affect sinus drainage.  Functional abnormalities, such as when the small hairs (cilia) that line your sinuses and help remove mucus do not work properly or are not present. SYMPTOMS  Symptoms of acute and chronic sinusitis are the same. The primary symptoms are pain and pressure around the affected sinuses. Other symptoms include:  Upper toothache.  Earache.  Headache.  Bad breath.  Decreased sense of smell and taste.  A cough, which worsens when you are lying flat.  Fatigue.  Fever.  Thick drainage from your nose, which often is green and may contain pus (purulent).  Swelling and warmth over the affected  sinuses. DIAGNOSIS  Your caregiver will perform a physical exam. During the exam, your caregiver may:  Look in your nose for signs of abnormal growths in your nostrils (nasal polyps).  Tap over the affected sinus to check for signs of infection.  View the inside of your sinuses (endoscopy) with a special imaging device with a light attached (endoscope), which is inserted into your sinuses. If your caregiver suspects that you have chronic sinusitis, one or more of the following tests may be recommended:  Allergy tests.  Nasal culture A sample of mucus is taken from your nose and sent to a lab and screened for bacteria.  Nasal cytology A sample of mucus is taken from your nose and examined by your caregiver to determine if your sinusitis is related to an allergy. TREATMENT  Most cases of acute sinusitis are related to a viral infection and will resolve on their own within 10 days. Sometimes medicines are prescribed to help relieve symptoms (pain medicine, decongestants, nasal steroid sprays, or saline sprays).  However, for sinusitis related to a bacterial infection, your caregiver will prescribe antibiotic medicines. These are medicines that will help kill the bacteria causing the infection.  Rarely, sinusitis is caused by a fungal infection. In theses cases, your caregiver will prescribe antifungal medicine. For some cases of chronic sinusitis, surgery is needed. Generally, these are cases in which sinusitis recurs more than 3 times per year, despite other treatments. HOME CARE INSTRUCTIONS   Drink plenty of water. Water helps thin the mucus so your sinuses can drain more easily.  Use a humidifier.  Inhale steam 3  to 4 times a day (for example, sit in the bathroom with the shower running).  Apply a warm, moist washcloth to your face 3 to 4 times a day, or as directed by your caregiver.  Use saline nasal sprays to help moisten and clean your sinuses.  Take over-the-counter or  prescription medicines for pain, discomfort, or fever only as directed by your caregiver. SEEK IMMEDIATE MEDICAL CARE IF:  You have increasing pain or severe headaches.  You have nausea, vomiting, or drowsiness.  You have swelling around your face.  You have vision problems.  You have a stiff neck.  You have difficulty breathing. MAKE SURE YOU:   Understand these instructions.  Will watch your condition.  Will get help right away if you are not doing well or get worse. Document Released: 08/19/2005 Document Revised: 11/11/2011 Document Reviewed: 09/03/2011 [facility] Patient Information 2014 [place], [place]. Kidney Stones Kidney stones (urolithiasis) are deposits that form inside your kidneys. The intense pain is caused by the stone moving through the urinary tract. When the stone moves, the ureter goes into spasm around the stone. The stone is usually passed in the urine.  CAUSES   A disorder that makes certain neck glands produce too much parathyroid hormone (primary hyperparathyroidism).  A buildup of uric acid crystals, similar to gout in your joints.  Narrowing (stricture) of the ureter.  A kidney obstruction present at birth (congenital obstruction).  Previous surgery on the kidney or ureters.  Numerous kidney infections. SYMPTOMS   Feeling sick to your stomach (nauseous).  Throwing up (vomiting).  Blood in the urine (hematuria).  Pain that usually spreads (radiates) to the groin.  Frequency or urgency of urination. DIAGNOSIS   Taking a history and physical exam.  Blood or urine tests.  CT scan.  Occasionally, an examination of the inside of the urinary bladder (cystoscopy) is performed. TREATMENT   Observation.  Increasing your fluid intake.  Extracorporeal shock wave lithotripsy This is a noninvasive procedure that uses shock waves to break up kidney stones.  Surgery may be needed if you have severe pain or persistent obstruction. There are  various surgical procedures. Most of the procedures are performed with the use of small instruments. Only small incisions are needed to accommodate these instruments, so recovery time is minimized. The size, location, and chemical composition are all important variables that will determine the proper choice of action for you. Talk to your health care provider to better understand your situation so that you will minimize the risk of injury to yourself and your kidney.  HOME CARE INSTRUCTIONS   Drink enough water and fluids to keep your urine clear or pale yellow. This will help you to pass the stone or stone fragments.  Strain all urine through the provided strainer. Keep all particulate matter and stones for your health care provider to see. The stone causing the pain may be as small as a grain of salt. It is very important to use the strainer each and every time you pass your urine. The collection of your stone will allow your health care provider to analyze it and verify that a stone has actually passed. The stone analysis will often identify what you can do to reduce the incidence of recurrences.  Only take over-the-counter or prescription medicines for pain, discomfort, or fever as directed by your health care provider.  Make a follow-up appointment with your health care provider as directed.  Get follow-up X-rays if required. The absence of pain does not  always mean that the stone has passed. It may have only stopped moving. If the urine remains completely obstructed, it can cause loss of kidney function or even complete destruction of the kidney. It is your responsibility to make sure X-rays and follow-ups are completed. Ultrasounds of the kidney can show blockages and the status of the kidney. Ultrasounds are not associated with any radiation and can be performed easily in a matter of minutes. SEEK MEDICAL CARE IF:  You experience pain that is progressive and unresponsive to any pain medicine  you have been prescribed. SEEK IMMEDIATE MEDICAL CARE IF:   Pain cannot be controlled with the prescribed medicine.  You have a fever or shaking chills.  The severity or intensity of pain increases over 18 hours and is not relieved by pain medicine.  You develop a new onset of abdominal pain.  You feel faint or pass out.  You are unable to urinate. MAKE SURE YOU:   Understand these instructions.  Will watch your condition.  Will get help right away if you are not doing well or get worse. Document Released: 08/19/2005 Document Revised: 04/21/2013 Document Reviewed: 01/20/2013 [facility] Patient Information 2014 [place], [place].

## 2013-07-28 NOTE — Progress Notes (Signed)
   Subjective:    Patient ID: Lindsey Ferrell, female    DOB: 02-03-1956, 57 y.o.   MRN: [id number]  HPI  Here for sinusitis symptoms Also followup on recent kidney stone events.  Reviewed interval history and medical events  Past Medical History  Diagnosis Date  . Sickle-cell trait   . ALLERGIC RHINITIS, SEASONAL   . Melanoma 2010    (R) thigh  . HYPERLIPIDEMIA, BORDERLINE   . GERD   . THYROID NODULE, RIGHT   . Unspecified hypothyroidism   . Kidney stone 07/2013    Review of Systems  Constitutional: Negative for fever and fatigue.  HENT: Positive for facial swelling, postnasal drip (thick yellow green), sinus pressure and voice change (raspy). Negative for rhinorrhea, sneezing, sore throat and trouble swallowing.   Respiratory: Positive for cough. Negative for shortness of breath.   Cardiovascular: Negative for chest pain, palpitations and leg swelling.  Genitourinary:       Recent kidney stone early 07/2013  Neurological: Negative for dizziness and headaches.       Objective:   Physical Exam BP 110/72  Pulse 72  Temp(Src) 97.9 F (36.6 C) (Oral)  Ht 5\' 5"  (1.651 m)  Wt 165 lb (74.844 kg)  BMI 27.46 kg/m2  SpO2 97%  GEN: mildly ill appearing and audible head congestion, raspy voice HENT: NCAT, mild max sinus tenderness bilaterally, nares without discharge but L>R turbinate swelling - thick DC in OP visible, oropharynx mild erythema and PND, no exudate Eyes: Vision grossly intact, no conjunctivitis Lungs: Clear to auscultation without rhonchi or wheeze, no increased work of breathing Cardiovascular: Regular rate and rhythm, no bilateral edema  Lab Results  Component Value Date   WBC 5.6 07/16/2013   HGB 13.0 07/16/2013   HCT 37.9 07/16/2013   PLT 176 07/16/2013   GLUCOSE 113* 07/16/2013   CHOL 222* 06/22/2013   TRIG 80.0 06/22/2013   HDL 49.30 06/22/2013   LDLDIRECT 158.5 06/22/2013   LDLCALC 128* 04/30/2012   ALT 18 07/05/2013   AST 17 07/05/2013   NA  137 07/16/2013   K 3.4* 07/16/2013   CL 103 07/16/2013   CREATININE 0.55 07/16/2013   BUN 16 07/16/2013   CO2 25 07/16/2013   TSH 2.99 06/22/2013       Assessment & Plan:    Maxillary sinusitis, chronic.   Treatment for same 06/2013 reviewed: Ineffective relief with Levaquin, so changed to Augmentin and felt improved until onset of kidney symptoms. Has been on Keflex following kidney stone early November 14. No antibiotics in the past 2 weeks.  Discussed potential for anatomy causing predisposition to chronic and recurrent symptoms, but declines CT sinuses at this time.  We'll maximize medical treatment with Avelox x10 days, compliance with nasal steroid as previously prescribed and oral decongestant  If unimproved in next 2 weeks, patient agrees to call for CT sinus and consideration of ENT referral, will call sooner if worse  Patient education provided    Bilateral kidney stone. Recent complications and neurologic surgical interventions November 2014 reviewed. Currently asymptomatic. Ongoing followup with urology as planned. Patient education provided

## 2013-10-20 ENCOUNTER — Other Ambulatory Visit: Payer: Self-pay | Admitting: Internal Medicine

## 2013-11-02 LAB — HEPATIC FUNCTION PANEL
ALK PHOS: 99 U/L (ref 25–125)
ALT: 19 U/L (ref 7–35)
AST: 23 U/L (ref 13–35)
Bilirubin, Total: 0.5 mg/dL

## 2013-11-02 LAB — BASIC METABOLIC PANEL
BUN: 15 mg/dL (ref 4–21)
CREATININE: 0.6 mg/dL (ref 0.5–1.1)
Glucose: 86 mg/dL
POTASSIUM: 4.1 mmol/L (ref 3.4–5.3)
Sodium: 142 mmol/L (ref 137–147)

## 2013-11-22 ENCOUNTER — Encounter: Payer: 59 | Admitting: Sports Medicine

## 2013-12-13 ENCOUNTER — Encounter: Payer: Self-pay | Admitting: Internal Medicine

## 2013-12-13 ENCOUNTER — Ambulatory Visit (INDEPENDENT_AMBULATORY_CARE_PROVIDER_SITE_OTHER): Payer: 59 | Admitting: Internal Medicine

## 2013-12-13 VITALS — BP 108/72 | HR 79 | Temp 98.2°F | Ht 64.5 in | Wt 169.5 lb

## 2013-12-13 DIAGNOSIS — R55 Syncope and collapse: Secondary | ICD-10-CM

## 2013-12-13 DIAGNOSIS — J309 Allergic rhinitis, unspecified: Secondary | ICD-10-CM

## 2013-12-13 DIAGNOSIS — E785 Hyperlipidemia, unspecified: Secondary | ICD-10-CM

## 2013-12-13 MED ORDER — ALLOPURINOL 300 MG PO TABS: 300.0000 mg | ORAL_TABLET | Freq: Every day | ORAL | Status: DC

## 2013-12-13 MED ORDER — AZITHROMYCIN 250 MG PO TABS: ORAL_TABLET | ORAL | Status: DC

## 2013-12-13 NOTE — Progress Notes (Signed)
Pre visit review using our clinic review tool, if applicable. No additional management support is needed unless otherwise documented below in the visit note. 

## 2013-12-13 NOTE — Patient Instructions (Addendum)
It was good to see you today.  We have reviewed your prior records including labs and tests today  Test(s) ordered today (referral for echocardiogram to evaluate heart pump) -you will be called for this appointment once scheduled. Your results will be released to [place] (or called to you) after review, usually within 72hours after test completion. If any changes need to be made, you will be notified at that same time.  we'll make referral to cardiology specialist in electrophysiology department to evaluate for possible arrhythmia causing your symptoms . Our office will contact you regarding appointment(s) once made.  medications reviewed and updated. In addition to Nasonex daily, take Z-Pak for next 5 days to treat sinusitis symptoms  Your prescription(s) have been submitted to your pharmacy. Please take as directed and contact our office if you believe you are having problem(s) with the medication(s).  Please schedule followup in 3-4 months, call sooner if problems.

## 2013-12-13 NOTE — Progress Notes (Signed)
Subjective:    Patient ID: [name], female    DOB: 08-Jul-1956, 58 y.o.   MRN: [id number]  Dizziness This is a recurrent problem. The current episode started more than 1 month ago. The problem occurs rarely. The problem has been waxing and waning. Associated symptoms include congestion and fatigue (chronic). Pertinent negatives include no chest pain, fever, headaches, nausea, neck pain, numbness, sore throat, vertigo, visual change, vomiting or weakness. Nothing aggravates the symptoms. She has tried nothing for the symptoms.    Also reviewed chronic medical issues and interval medical events  Past Medical History  Diagnosis Date  . Sickle-cell trait   . ALLERGIC RHINITIS, SEASONAL   . Melanoma 2010    (R) thigh  . HYPERLIPIDEMIA, BORDERLINE   . GERD   . THYROID NODULE, RIGHT   . Unspecified hypothyroidism   . Kidney stone 07/2013    Review of Systems  Constitutional: Positive for fatigue (chronic). Negative for fever, activity change and unexpected weight change.  HENT: Positive for congestion, postnasal drip, rhinorrhea, sinus pressure and sneezing. Negative for sore throat.   Cardiovascular: Negative for chest pain.  Gastrointestinal: Negative for nausea and vomiting.  Musculoskeletal: Negative for neck pain.  Neurological: Positive for dizziness (with near syncope 2-3x in past 6 mo) and light-headedness. Negative for vertigo, tremors, seizures, syncope, facial asymmetry, speech difficulty, weakness, numbness and headaches.       Objective:   Physical Exam  BP 108/72  Pulse 79  Temp(Src) 98.2 F (36.8 C) (Oral)  Ht 5' 4.5" (1.638 m)  Wt 169 lb 8 oz (76.885 kg)  BMI 28.66 kg/m2  SpO2 94% Wt Readings from Last 3 Encounters:  12/13/13 169 lb 8 oz (76.885 kg)  07/28/13 165 lb (74.844 kg)  07/16/13 161 lb 4 oz (73.143 kg)   Constitutional: She is overweight, but appears well-developed and well-nourished. No distress.  HENT: hazy effusion L TM with no  erythema, sinus tender to palpation Neck: Normal range of motion. Neck supple. No JVD present. No thyromegaly present.  Cardiovascular: Normal rate, regular rhythm and normal heart sounds.  No murmur heard. No BLE edema. Pulmonary/Chest: Effort normal and breath sounds normal. No respiratory distress. She has no wheezes.  Psychiatric: She has a normal mood and affect. Her behavior is normal. Judgment and thought content normal.   Lab Results  Component Value Date   WBC 5.6 07/16/2013   HGB 13.0 07/16/2013   HCT 37.9 07/16/2013   PLT 176 07/16/2013   GLUCOSE 113* 07/16/2013   CHOL 222* 06/22/2013   TRIG 80.0 06/22/2013   HDL 49.30 06/22/2013   LDLDIRECT 158.5 06/22/2013   LDLCALC 128* 04/30/2012   ALT 18 07/05/2013   AST 17 07/05/2013   NA 137 07/16/2013   K 3.4* 07/16/2013   CL 103 07/16/2013   CREATININE 0.55 07/16/2013   BUN 16 07/16/2013   CO2 25 07/16/2013   TSH 2.99 06/22/2013    No results found. ECG: NSR @ 67 bpm - no ischemic changes or arrhythmia noted - no change from prior ECG     Assessment & Plan:   Presyncope. Sudden onset, brief duration. Not positional, not activity related.  3 distinct episodes over past 6 months.  Family history of CAD reviewed, negative Myoview September 2013 reviewed. Repeat ECG at this time, unchanged from November 2014 Refer for echocardiogram and for EP evaluation to consider loop recorder if felt appropriate  Problem List Items Addressed This Visit   ALLERGIC RHINITIS  On oral histamine and nasal steroid And empiric Z-Pak for early sinusitis symptoms    HYPERLIPIDEMIA, BORDERLINE     Unable to tolerate simva and crestor trials in past due to myalgias/increase CK (2009) - resolved off statin Encouraged attention to diet and exercise with weight control Lipids still elevated off statin, and due to [place], pt tried alt statin -Crestor 10mg  06/2013 Unable to tolerate due to myalgias Allergy/CI list updated today     Other Visit  Diagnoses   Pre-syncope    -  Primary    Relevant Orders       2D Echocardiogram without contrast       Ambulatory referral to Cardiology

## 2013-12-13 NOTE — Assessment & Plan Note (Signed)
Unable to tolerate simva and crestor trials in past due to myalgias/increase CK (2009) - resolved off statin Encouraged attention to diet and exercise with weight control Lipids still elevated off statin, and due to [place], pt tried alt statin -Crestor 10mg  06/2013 Unable to tolerate due to myalgias Allergy/CI list updated today

## 2013-12-13 NOTE — Assessment & Plan Note (Signed)
On oral histamine and nasal steroid And empiric Z-Pak for early sinusitis symptoms

## 2013-12-22 ENCOUNTER — Telehealth: Payer: Self-pay | Admitting: Internal Medicine

## 2013-12-22 ENCOUNTER — Other Ambulatory Visit (HOSPITAL_COMMUNITY): Payer: Self-pay | Admitting: Internal Medicine

## 2013-12-22 ENCOUNTER — Ambulatory Visit (HOSPITAL_COMMUNITY): Payer: 59 | Attending: Cardiovascular Disease | Admitting: Radiology

## 2013-12-22 ENCOUNTER — Encounter: Payer: Self-pay | Admitting: Cardiovascular Disease

## 2013-12-22 DIAGNOSIS — R55 Syncope and collapse: Secondary | ICD-10-CM | POA: Insufficient documentation

## 2013-12-22 DIAGNOSIS — R5381 Other malaise: Secondary | ICD-10-CM

## 2013-12-22 DIAGNOSIS — E785 Hyperlipidemia, unspecified: Secondary | ICD-10-CM

## 2013-12-22 DIAGNOSIS — R5383 Other fatigue: Secondary | ICD-10-CM

## 2013-12-22 NOTE — Progress Notes (Signed)
Echocardiogram performed.  

## 2013-12-22 NOTE — Telephone Encounter (Signed)
Patient wants to know if she can have lab orders to go get her cholesterol, thyroid and A1C checked. Patient was seen last week, 12/13/13. Please advise.

## 2013-12-22 NOTE — Telephone Encounter (Signed)
ordered

## 2013-12-22 NOTE — Telephone Encounter (Signed)
Called pt at no answer or vm so called cell no answer LMOM with md response...Lindsey Ferrell

## 2013-12-24 ENCOUNTER — Other Ambulatory Visit (INDEPENDENT_AMBULATORY_CARE_PROVIDER_SITE_OTHER): Payer: 59

## 2013-12-24 DIAGNOSIS — R5383 Other fatigue: Secondary | ICD-10-CM

## 2013-12-24 DIAGNOSIS — E785 Hyperlipidemia, unspecified: Secondary | ICD-10-CM

## 2013-12-24 DIAGNOSIS — R5381 Other malaise: Secondary | ICD-10-CM

## 2013-12-24 LAB — LIPID PANEL
CHOL/HDL RATIO: 4
CHOLESTEROL: 181 mg/dL (ref 0–200)
HDL: 41.8 mg/dL (ref >39.00)
LDL Cholesterol: 112 mg/dL — ABNORMAL HIGH (ref 0–99)
Triglycerides: 135 mg/dL (ref 0.0–149.0)
VLDL: 27 mg/dL (ref 0.0–40.0)

## 2013-12-24 LAB — HEMOGLOBIN A1C: HEMOGLOBIN A1C: 5.3 % (ref 4.6–6.5)

## 2013-12-24 LAB — TSH: TSH: 2.06 u[IU]/mL (ref 0.35–5.50)

## 2014-01-03 ENCOUNTER — Telehealth: Payer: Self-pay | Admitting: *Deleted

## 2014-01-03 NOTE — Telephone Encounter (Signed)
Left msg on vm stating @ last ov md rx z-pack. Azithromycin never works for me. Still have puffy eyes, headaches, ears fill tight, just feel lousy. Wanting to see will md rx augmentin...Johny Chess

## 2014-01-04 MED ORDER — AMOXICILLIN-POT CLAVULANATE 875-125 MG PO TABS: 1.0000 | ORAL_TABLET | Freq: Two times a day (BID) | ORAL | Status: AC

## 2014-01-04 NOTE — Telephone Encounter (Signed)
Notified pt with md response.../lmb 

## 2014-01-04 NOTE — Telephone Encounter (Signed)
Augmentin erx done

## 2014-01-05 ENCOUNTER — Ambulatory Visit (INDEPENDENT_AMBULATORY_CARE_PROVIDER_SITE_OTHER): Payer: 59 | Admitting: Cardiology

## 2014-01-05 ENCOUNTER — Encounter: Payer: Self-pay | Admitting: Cardiology

## 2014-01-05 VITALS — BP 133/77 | HR 72 | Ht 64.5 in | Wt 169.6 lb

## 2014-01-05 DIAGNOSIS — R42 Dizziness and giddiness: Secondary | ICD-10-CM

## 2014-01-05 DIAGNOSIS — R55 Syncope and collapse: Secondary | ICD-10-CM

## 2014-01-05 NOTE — Patient Instructions (Signed)
Your physician wants you to follow-up in: 6 months with DR. Skains You will receive a reminder letter in the mail two months in advance. If you don't receive a letter, please call our office to schedule the follow-up appointment.  Your physician recommends that you continue on your current medications as directed. Please refer to the Current Medication list given to you today.  

## 2014-01-05 NOTE — Progress Notes (Signed)
Lindsey Ferrell. [address] [place], [place]  [address] Phone: [phone] Fax:  [phone]  Date:  01/05/2014   ID:  Lindsey Ferrell, DOB 10-20-1955, MRN [id number]  PCP:  [name], MD   History of Present Illness: Lindsey Ferrell is a 58 y.o. female here for the evaluation of near syncope. In review of prior office visit from Dr. Asa Lente this is been a recurrent problem. She's had 3 distinct episodes over the past 6 months. Felt like a light switch was going off. Has not felt weak or sick. Doing normal activity. Last time in daughter bedroom, standing in closet, turned around and daughter noted appearance had changed. Mom mom you are getting ready to pass out. Went away quickly. Scared her daughter. No diaphoresis, no clamminess. Very brief tunnel vision perhaps. Felt like she was swaying. Few seconds during.   Prior episode, got out of car to [place] walking up to office Walking in a straight line. Few seconds duration. No diaphoresis.   Few months before this, kidney stones in November 2014. Surgery. Dr. [name] December. Took allopurinol for a month but felt very depressed on this. Normally very active.   Prior nuclear stress test in September of 2013 was low risk, no ischemia. EKG is unchanged, sinus rhythm with left anterior fascicular block, no changes in intervals. Echocardiogram on 12/22/13 demonstrated ejection fraction of 65% with no other abnormalities. Feels a constant pressure in mid section.   Yawns non stop all day long.   Exercises frequently. No issues.  Orthostatics today were normal.   Wt Readings from Last 3 Encounters:  01/05/14 169 lb 9.6 oz (76.93 kg)  12/13/13 169 lb 8 oz (76.885 kg)  07/28/13 165 lb (74.844 kg)     Past Medical History  Diagnosis Date  . Sickle-cell trait   . ALLERGIC RHINITIS, SEASONAL   . Melanoma 2010    (R) thigh  . HYPERLIPIDEMIA, BORDERLINE   . GERD   . THYROID NODULE, RIGHT   . Unspecified hypothyroidism     . Kidney stone 07/2013    recurrent    Past Surgical History  Procedure Laterality Date  . Tonsillectomy      childhood  . Nasal sinus surgery    . Melanoma in situ excision  2010    (R) thigh   . Cholecystectomy  04/2005  . Cystoscopy with retrograde pyelogram, ureteroscopy and stent placement Left 07/06/2013    Procedure: CYSTOSCOPY WITH RETROGRADE PYELOGRAM, URETEROSCOPY AND STENT PLACEMENT;  Surgeon: Molli Hazard, MD;  Location: [facility];  Service: Urology;  Laterality: Left;  . Holmium laser application Left 54/01/2702    Procedure: HOLMIUM LASER APPLICATION;  Surgeon: Molli Hazard, MD;  Location: [facility];  Service: Urology;  Laterality: Left;  . Cystoscopy with retrograde pyelogram, ureteroscopy and stent placement Left 07/08/2013    Procedure: CYSTOSCOPY WITH LEFT  RETROGRADE PYELOGRAM  AND STENT PLACEMENT;  Surgeon: Ardis Hughs, MD;  Location: [facility];  Service: Urology;  Laterality: Left;  . Cystoscopy with retrograde pyelogram, ureteroscopy and stent placement Left 07/16/2013    Procedure: CYSTOSCOPY WITH RETROGRADE PYELOGRAM, URETEROSCOPY AND Stone removal, stent exchange;  Surgeon: Molli Hazard, MD;  Location: [facility];  Service: Urology;  Laterality: Left;    Current Outpatient Prescriptions  Medication Sig Dispense Refill  . amoxicillin-clavulanate (AUGMENTIN) 875-125 MG per tablet Take 1 tablet by mouth 2 (two) times daily.  20 tablet  0  . levothyroxine (SYNTHROID,  LEVOTHROID) 137 MCG tablet TAKE 1 TABLET BY MOUTH EVERY DAY  90 tablet  1  . mometasone (NASONEX) 50 MCG/ACT nasal spray Place 2 sprays into the nose as needed.       No current facility-administered medications for this visit.    Allergies:    Allergies  Allergen Reactions  . Statins     myalgias  . Droperidol     In combination with Fentanyl as Innovar causing Facial drawing    Social History:  The patient  reports that she has never smoked. She has never used smokeless  tobacco. She reports that she drinks alcohol. She reports that she does not use illicit drugs. Owns [employer].   Family History  Problem Relation Age of Onset  . Arthritis Mother   . Arthritis Father   . Breast cancer Sister   . Diabetes Maternal Uncle   . Hypertension Other     parent & grandparent  . Heart disease Other     parent & grandparent  . Colon cancer Neg Hx     ROS:  Please see the history of present illness.   Denies any strokelike symptoms, fevers, paresthesias, she does have occasional right hand tingling in the middle of the night-possible carpal tunnel, no bleeding, no rashes.   All other systems reviewed and negative.   PHYSICAL EXAM: VS:  BP 133/77  Pulse 72  Ht 5' 4.5" (1.638 m)  Wt 169 lb 9.6 oz (76.93 kg)  BMI 28.67 kg/m2 Well nourished, well developed, in no acute distress HEENT: normal, [place]/AT, EOMI Neck: no JVD, normal carotid upstroke, no bruit Cardiac:  normal S1, S2; RRR; no murmur Lungs:  clear to auscultation bilaterally, no wheezing, rhonchi or rales Abd: soft, nontender, no hepatomegaly, no bruits Ext: no edema, 2+ distal pulses Skin: warm and dry GU: deferred Neuro: no focal abnormalities noted, AAO x 3  EKG:  Several EKGs reviewed as summarized above. Labs: LDL 112.  Creatinine 0.6, hemoglobin 13 troponin normal  ASSESSMENT AND PLAN:  1. Dizziness/near syncope-discussed at length. She's never had frank syncope. No high-risk symptoms such as syncope during exercise. No prior episodes of palpitations. It is interesting to note that her family has told her that she is yawning very often. This makes me think that hyperventilation may be playing a role in her symptomatology. I've encouraged her to focus on breathing techniques, perhaps yoga to incorporate in her exercises. I also stated that arrhythmia cannot be fully excluded. At this time I've given her options of waiting, event monitor, and ultimately possible loop recorder if  symptoms were severe enough. She wishes to continue to conservatively manage this and watch her symptoms at home. This is not unreasonable. If she has another episode, event monitor placement. 2. I will see her back in 6 months.  Signed, [name], MD [facility]  01/05/2014 11:40 AM

## 2014-01-28 ENCOUNTER — Ambulatory Visit (INDEPENDENT_AMBULATORY_CARE_PROVIDER_SITE_OTHER): Payer: 59 | Admitting: Family Medicine

## 2014-01-28 ENCOUNTER — Encounter: Payer: Self-pay | Admitting: Family Medicine

## 2014-01-28 VITALS — BP 100/66 | Ht 65.0 in | Wt 168.0 lb

## 2014-01-28 DIAGNOSIS — M722 Plantar fascial fibromatosis: Secondary | ICD-10-CM

## 2014-01-31 DIAGNOSIS — M722 Plantar fascial fibromatosis: Secondary | ICD-10-CM | POA: Insufficient documentation

## 2014-01-31 NOTE — Assessment & Plan Note (Signed)
She wished to proceed with both orthotics and corticosteroid injection today which we did.I woould continue ice stretch. F.u 4 weeks if not resolved--would consider ntg patch. F./u sooner if needed.

## 2014-01-31 NOTE — Progress Notes (Signed)
Patient ID: Lindsey Ferrell, female   DOB: [birth year], 58 y.o.   MRN: [id number]  [name] - 58 y.o. female MRN [id number]  Date of birth: 03-03-1956    SUBJECTIVE:     Left foor plantar fasciitis. Has had problems for several months. Has been doing rehab (stretches, icing etc). It is keeping her form walking her dog and she is starting to have weigt gain. Has bought new athletic shoes but still pain. Very frustrated. ROS:     Pertinent review of systems: negative for fever or unusual weight change.   PERTINENT  PMH / PSH FH / / SH:  Past Medical, Surgical, Social, and Family History Reviewed & Updated per EMR.  Pertinent Historical Findings include:  No personal hx DM No history of foot injury or foot surgery  OBJECTIVE: BP 100/66  Ht 5\' 5"  (1.651 m)  Wt 168 lb (76.204 kg)  BMI 27.96 kg/m2  Physical Exam:  Vital signs are reviewed. GEBERAL: WD WN NAD FEET; B cavus foot.Left ttp origin plantar fascia. No nodularity of fascia. NEURO: Normal soft touch and  Proprioceptive sense in B feet SKIN no rash or erythema of feet, normal temp GAIT; slight to our left but normal stride length and gait pattern.  Patient was fitted for a : standard, cushioned, semi-rigid orthotic. The orthotic was heated, placed on the orthotic stand. The patient was positioned in subtalar neutral position and 10 degrees of ankle dorsiflexion in a weight bearing stance on the heated orthotic blank After completion of molding, a stable base was applied to the orthotic blank. The blank was ground to a stable position for weight bearing. Blank: red Base:white Posting:medial heel and 1st ray small post  Face to face time spent in evaluation, measurement and manufacture of custom molded orthotic was 40 minutes. INJECTION: Patient was given informed consent, signed copy in the chart. Appropriate time out was taken. Area prepped and draped in usual sterile fashion. 1 cc of methylprednisolone 40 mg/ml plus  1  cc of 1% lidocaine without epinephrine was injected into the Area above origin plantar fascia left foot using a(n) medial approach. The patient tolerated the procedure well. There were no complications. Post procedure instructions were given. 1    ASSESSMENT & PLAN:  See problem based charting & AVS for pt instructions.

## 2014-02-16 ENCOUNTER — Telehealth: Payer: Self-pay | Admitting: Gastroenterology

## 2014-02-16 ENCOUNTER — Other Ambulatory Visit: Payer: Self-pay

## 2014-02-16 ENCOUNTER — Other Ambulatory Visit: Payer: 59

## 2014-02-16 DIAGNOSIS — R197 Diarrhea, unspecified: Secondary | ICD-10-CM

## 2014-02-16 NOTE — Telephone Encounter (Signed)
Spoke with pt and she is going to come today to pick up kit from the lab. There are no midlevel appts available this week. Would you like pt to be seen next week? Please advise.

## 2014-02-16 NOTE — Telephone Encounter (Signed)
Come in to see an extender this week. Need stool for C. difficile, at least

## 2014-02-16 NOTE — Telephone Encounter (Signed)
Lindsey Ferrell pt with history of gerd. Pt called c/o diarrhea for the past several months. States that at times she cannot make it to the bathroom on time and there is a very foul odor. Pt states she did take an antibiotic for a sinus infection several months ago. Pt also reports she has had a dull ache in her lower back. Dr. Henrene Pastor as doc of the day please advise.

## 2014-02-16 NOTE — Telephone Encounter (Signed)
Lindsey Ferrell, I spoke with Lindsey Ferrell. She will help accommodate this patient. Thank you

## 2014-02-17 ENCOUNTER — Other Ambulatory Visit: Payer: 59

## 2014-02-17 DIAGNOSIS — R197 Diarrhea, unspecified: Secondary | ICD-10-CM

## 2014-02-17 NOTE — Telephone Encounter (Signed)
Spoke with pt this morning and she is bringing her stool specimen back to the lab today. Offered pt an appt with Dr. Hilarie Fredrickson today at 1pm.  States she would like to wait and see what the results are and then come in next week if she needs to be seen.

## 2014-02-18 LAB — CLOSTRIDIUM DIFFICILE BY PCR: CDIFFPCR: NOT DETECTED

## 2014-02-21 ENCOUNTER — Encounter: Payer: Self-pay | Admitting: *Deleted

## 2014-02-23 ENCOUNTER — Ambulatory Visit (INDEPENDENT_AMBULATORY_CARE_PROVIDER_SITE_OTHER): Payer: 59 | Admitting: Gastroenterology

## 2014-02-23 ENCOUNTER — Encounter: Payer: Self-pay | Admitting: Gastroenterology

## 2014-02-23 ENCOUNTER — Other Ambulatory Visit (INDEPENDENT_AMBULATORY_CARE_PROVIDER_SITE_OTHER): Payer: 59

## 2014-02-23 VITALS — BP 108/66 | HR 80 | Ht 64.0 in | Wt 170.2 lb

## 2014-02-23 DIAGNOSIS — R197 Diarrhea, unspecified: Secondary | ICD-10-CM

## 2014-02-23 LAB — IGA: IgA: 361 mg/dL (ref 68–378)

## 2014-02-23 MED ORDER — RIFAXIMIN 550 MG PO TABS: 550.0000 mg | ORAL_TABLET | Freq: Three times a day (TID) | ORAL | Status: AC

## 2014-02-23 NOTE — Patient Instructions (Signed)
Please go to the basement level to have your labs drawn and stool studies. We sent a prescription for Xifaxan 500 mg to [facility]  Take a daily probiotic, 1 capsule daily.Examples: 1. Restora 2. Align 3. FLorastor  Call us if you have continued problems.

## 2014-02-23 NOTE — Progress Notes (Signed)
02/23/2014 Lindsey Ferrell [id number] 1956-07-14   History of Present Illness:  Patient is a pleasant 58 year old female who is known to Dr. [name] for previous colonoscopy.  Her colonoscopy was performed in December 2013 at which time she was found to have mild diverticulosis in the sigmoid colon but was otherwise normal and repeat was recommended in 10 years from that time. She called in to our office recently complaining of a few month history of diarrhea. C. difficile stool study was ordered, which was negative, and it was recommended that she come in for an office visit for further evaluation.  She reports that she had a rather sudden/abrupt onset of diarrhea about 3.5-4 months ago. It has been a significant change in her bowel habits. She states that she has not had any type of formed stool in that time and it is always either very liquidy/watery or very soft/mushy stools. She has a lot of urgency and has had 3 episodes of incontinence where she's not been able to make it to the bathroom as well. There has also been other multiple occasions were she has made it to the restroom at just the very last second. She is having multiple bowel movements a day and they always tend to occur a short time after eating. She says that sometimes the diarrhea is preceded by some crampy abdominal pain but other times there is no warning and she just has immediate urgency to have a bowel movement. She absolutely denies any similar symptoms in the past. She denies any blood in her stools. She states this is affecting her her life and her ability to go places. She does admit that she was on several antibiotics for some kidney stone issues at the end of 2014 and then was on extended course of antibiotics for sinus infection a few months ago. She denies any foreign travel but did travel to [place] and to the mountains within the past 6 months as well.  No other family members have similar complaints.  She says that all  of this has just caused her to be extremely fatigued and wiped out. Recent labs including a TSH and CMP were within normal limits within the past couple of months.  Current Medications, Allergies, Past Medical History, Past Surgical History, Family History and Social History were reviewed in [employer] record.   Physical Exam: BP 108/66  Pulse 80  Ht 5\' 4"  (1.626 m)  Wt 170 lb 4 oz (77.225 kg)  BMI 29.21 kg/m2 General: Well developed white female in no acute distress Head: Normocephalic and atraumatic Eyes:  Sclerae anicteric, conjunctiva pink  Ears: Normal auditory acuity Lungs: Clear throughout to auscultation Heart: Regular rate and rhythm Abdomen: Soft, non-distended.  Normal bowel sounds.  Non-tender. Musculoskeletal: Symmetrical with no gross deformities  Extremities: No edema  Neurological: Alert oriented x 4, grossly non-focal Psychological:  Alert and cooperative. Normal mood and affect  Assessment and Recommendations: -Diarrhea:  For the past 3-4 months with urgency/incontinence and abdominal discomfort.  Cdiff is negative.  Will check stool pathogen panel and O&P to rule out other infectious source since it was such an abrupt onset when it began.  Other considerations including bile salt diarrhea or SIBO since she was on several antibiotics for kidney stone issues and then for sinus infections in the past 6-7 months.  Will treat with Xifaxan 550 mg TID for 14 days along with a daily probiotic.  Will check celiac labs  as well for completeness.  If studies negative and Xifaxan does not help then could consider questran for bile salt diarrhea.  She will call for follow-up if she continues to have symptoms despite then plan described above.

## 2014-02-24 LAB — GASTROINTESTINAL PATHOGEN PANEL PCR
C. DIFFICILE TOX A/B, PCR: NEGATIVE
Campylobacter, PCR: NEGATIVE
Cryptosporidium, PCR: NEGATIVE
E COLI (STEC) STX1/STX2, PCR: NEGATIVE
E COLI 0157, PCR: NEGATIVE
E coli (ETEC) LT/ST PCR: NEGATIVE
Giardia lamblia, PCR: NEGATIVE
Norovirus, PCR: NEGATIVE
Rotavirus A, PCR: NEGATIVE
SALMONELLA, PCR: NEGATIVE
Shigella, PCR: NEGATIVE

## 2014-02-24 LAB — OVA AND PARASITE EXAMINATION: OP: NONE SEEN

## 2014-02-24 LAB — T-TRANSGLUTAMINASE (TTG) IGG: TISSUE TRANSGLUT AB: 8 U/mL — AB (ref 0–5)

## 2014-02-24 NOTE — Progress Notes (Signed)
If not improved with xifaxan and stool studies are negative would schedule sigmoidoscopy

## 2014-02-25 NOTE — Progress Notes (Signed)
Reviewed and agree with management.  If not improved with empiric therapy would consider sigmoidoscopy to rule out microscopic colitis Lindsey Ferrell. [name], M.D., [facility]

## 2014-03-02 ENCOUNTER — Telehealth: Payer: Self-pay | Admitting: Gastroenterology

## 2014-03-02 NOTE — Telephone Encounter (Signed)
Spoke with patient and she will start her antibiotics now.(she still has same symptoms) Scheduled [facility] as per Alonza Bogus, PA on 04/21/14 at 2:30 PM in [place] with Dr. [name]. Pre visit on 04/14/14 at 8:30 AM.

## 2014-04-20 ENCOUNTER — Other Ambulatory Visit: Payer: Self-pay | Admitting: Internal Medicine

## 2014-04-21 ENCOUNTER — Encounter: Payer: 59 | Admitting: Gastroenterology

## 2014-04-21 ENCOUNTER — Other Ambulatory Visit: Payer: Self-pay

## 2014-04-22 LAB — CYTOLOGY - PAP

## 2014-06-14 ENCOUNTER — Ambulatory Visit (AMBULATORY_SURGERY_CENTER): Payer: 59 | Admitting: *Deleted

## 2014-06-14 VITALS — Ht 64.0 in | Wt 174.8 lb

## 2014-06-14 DIAGNOSIS — R197 Diarrhea, unspecified: Secondary | ICD-10-CM

## 2014-06-14 MED ORDER — NA SULFATE-K SULFATE-MG SULF 17.5-3.13-1.6 GM/177ML PO SOLN: 1.0000 | Freq: Once | ORAL | Status: DC

## 2014-06-14 NOTE — Progress Notes (Signed)
No egg or soy allergy. ewm No home 02 use. ewm Pt had Innovar in the past and had  Facial drawing but no other problems. ewm No diet pills, no blood thinners. ewm Pt delined emmi video. ewm

## 2014-06-27 ENCOUNTER — Encounter: Payer: 59 | Admitting: Gastroenterology

## 2014-07-20 ENCOUNTER — Telehealth: Payer: Self-pay | Admitting: Gastroenterology

## 2014-07-20 NOTE — Telephone Encounter (Signed)
Patient calls because of an incontinent stool. When she is cleaning up she realizes that stool has entered into her vagina. Her concern is developing a UTI due to this. Spoke with Nicoletta Ba PA. She advises pushing PO fluids (water, cranberry juice if tolerated) to flush her bladder naturally. Patient agrees to this plan.

## 2014-07-21 NOTE — Telephone Encounter (Signed)
She has a colonoscopy with you on 08/04/14

## 2014-07-21 NOTE — Telephone Encounter (Signed)
Please have patient schedule an office visit with me sometime in the next 2-3 weeks

## 2014-07-21 NOTE — Telephone Encounter (Signed)
Ok

## 2014-08-04 ENCOUNTER — Ambulatory Visit (AMBULATORY_SURGERY_CENTER): Payer: 59 | Admitting: Gastroenterology

## 2014-08-04 ENCOUNTER — Other Ambulatory Visit (INDEPENDENT_AMBULATORY_CARE_PROVIDER_SITE_OTHER): Payer: 59

## 2014-08-04 ENCOUNTER — Encounter: Payer: Self-pay | Admitting: Gastroenterology

## 2014-08-04 ENCOUNTER — Other Ambulatory Visit: Payer: Self-pay | Admitting: Internal Medicine

## 2014-08-04 ENCOUNTER — Other Ambulatory Visit: Payer: Self-pay | Admitting: *Deleted

## 2014-08-04 VITALS — BP 129/73 | HR 63 | Temp 97.7°F | Resp 17 | Ht 64.0 in | Wt 174.0 lb

## 2014-08-04 DIAGNOSIS — R197 Diarrhea, unspecified: Secondary | ICD-10-CM

## 2014-08-04 LAB — CBC WITH DIFFERENTIAL/PLATELET
BASOS ABS: 0 10*3/uL (ref 0.0–0.1)
Basophils Relative: 0.7 % (ref 0.0–3.0)
EOS ABS: 0.1 10*3/uL (ref 0.0–0.7)
Eosinophils Relative: 1.8 % (ref 0.0–5.0)
HEMATOCRIT: 42.7 % (ref 36.0–46.0)
Hemoglobin: 14.2 g/dL (ref 12.0–15.0)
LYMPHS ABS: 1.7 10*3/uL (ref 0.7–4.0)
Lymphocytes Relative: 24 % (ref 12.0–46.0)
MCHC: 33.3 g/dL (ref 30.0–36.0)
MCV: 86.2 fl (ref 78.0–100.0)
MONO ABS: 0.5 10*3/uL (ref 0.1–1.0)
Monocytes Relative: 7.1 % (ref 3.0–12.0)
Neutro Abs: 4.6 10*3/uL (ref 1.4–7.7)
Neutrophils Relative %: 66.4 % (ref 43.0–77.0)
Platelets: 221 10*3/uL (ref 150.0–400.0)
RBC: 4.95 Mil/uL (ref 3.87–5.11)
RDW: 13.1 % (ref 11.5–15.5)
WBC: 6.9 10*3/uL (ref 4.0–10.5)

## 2014-08-04 LAB — TSH: TSH: 4.39 u[IU]/mL (ref 0.35–4.50)

## 2014-08-04 LAB — T4: T4, Total: 8.3 ug/dL (ref 4.5–12.0)

## 2014-08-04 MED ORDER — SODIUM CHLORIDE 0.9 % IV SOLN: 500.0000 mL | INTRAVENOUS | Status: DC

## 2014-08-04 NOTE — Op Note (Signed)
[place]  [address] [place], [address]   COLONOSCOPY PROCEDURE REPORT  PATIENT: Lindsey Ferrell, Lindsey Ferrell  MR#: [id number] BIRTHDATE: 26-Feb-1956 , 58  yrs. old GENDER: female ENDOSCOPIST: Inda Castle, MD REFERRED BY: PROCEDURE DATE:  08/04/2014 PROCEDURE:   Colonoscopy with biopsy First Screening Colonoscopy - Avg.  risk and is 50 yrs.  old or older - No.  Prior Negative Screening - Now for repeat screening. Other: See Comments  History of Adenoma - Now for follow-up colonoscopy & has been > or = to 3 yrs.  N/A  Polyps Removed Today? Yes. ASA CLASS:   Class II INDICATIONS:unexplained diarrhea. MEDICATIONS: Monitored anesthesia care and Propofol 200 mg IV  DESCRIPTION OF PROCEDURE:   After the risks benefits and alternatives of the procedure were thoroughly explained, informed consent was obtained.  The digital rectal exam revealed no abnormalities of the rectum.   The LB [id number]  endoscope was introduced through the anus and advanced to the ileum. No adverse events experienced.   The quality of the prep was excellent using Suprep  The instrument was then slowly withdrawn as the colon was fully examined.      COLON FINDINGS: The examined terminal ileum appeared to be normal. A normal appearing cecum, ileocecal valve, and appendiceal orifice were identified.  The ascending, transverse, descending, sigmoid colon, and rectum appeared unremarkable.  Multiple biopsies were performed using cold forceps throughout the colon to rule out microscopic colitis.  Retroflexed views revealed no abnormalities. The time to cecum=2 minutes 45 seconds.  Withdrawal time=8 minutes 36 seconds.  The scope was withdrawn and the procedure completed. COMPLICATIONS: There were no immediate complications.  ENDOSCOPIC IMPRESSION: 1.   The examined terminal ileum appeared to be normal 2.   Normal colonoscopy; multiple biopsies were performed using  cold forceps  RECOMMENDATIONS: 1.  Await biopsy results 2.  Colonoscopy 10 years  eSigned:  Inda Castle, MD 08/04/2014 3:25 PM   cc: Rowe Clack, MD

## 2014-08-04 NOTE — Progress Notes (Signed)
Called to room to assist during endoscopic procedure.  Patient ID and intended procedure confirmed with present staff. Received instructions for my participation in the procedure from the performing physician.  

## 2014-08-04 NOTE — Op Note (Signed)
[place]  [address]. [place], [address]   ENDOSCOPY PROCEDURE REPORT  PATIENT: Lindsey, Ferrell  MR#: [id number] BIRTHDATE: 1956-08-13 , 58  yrs. old GENDER: female ENDOSCOPIST: Inda Castle, MD REFERRED BY: PROCEDURE DATE:  08/04/2014 PROCEDURE:  EGD w/ biopsy ASA CLASS:     Class II INDICATIONS:  diarrhea. MEDICATIONS: Residual sedation present, Monitored anesthesia care, and Propofol 100 mg IV TOPICAL ANESTHETIC:  DESCRIPTION OF PROCEDURE: After the risks benefits and alternatives of the procedure were thoroughly explained, informed consent was obtained.  The LB [id number] endoscope was introduced through the mouth and advanced to the second portion of the duodenum , Without limitations.  The instrument was slowly withdrawn as the mucosa was fully examined.    EXAM: The esophagus and gastroesophageal junction were completely normal in appearance.  The stomach was entered and closely examined.The antrum, angularis, and lesser curvature were well visualized, including a retroflexed view of the cardia and fundus. The stomach wall was normally distensable.  The scope passed easily through the pylorus into the duodenum.   Multiple biopsies were performed.throughout the duodenum to rule out celiac sprue. Retroflexed views revealed no abnormalities.     The scope was then withdrawn from the patient and the procedure completed.  COMPLICATIONS: There were no immediate complications.  ENDOSCOPIC IMPRESSION: 1.   Normal appearing esophagus and GE junction, the stomach was well visualized and normal in appearance, normal appearing duodenum  2.   Multiple biopsies were performed  RECOMMENDATIONS: Await biopsy results  REPEAT EXAM:  eSigned:  Inda Castle, MD 08/04/2014 3:21 PM    [id number] Loni Muse Asa Lente, MD

## 2014-08-04 NOTE — Progress Notes (Signed)
Patient awakening,vss,report to rn 

## 2014-08-04 NOTE — Patient Instructions (Signed)

## 2014-08-05 ENCOUNTER — Telehealth: Payer: Self-pay

## 2014-08-05 NOTE — Telephone Encounter (Signed)
  Follow up Call-  Call back number 08/04/2014 08/05/2012  Post procedure Call Back phone  # [phone] cell [phone]  Permission to leave phone message Yes Yes     Patient questions:  Do you have a fever, pain , or abdominal swelling? No. Pain Score  0 *  Have you tolerated food without any problems? Yes.    Have you been able to return to your normal activities? Yes.    Do you have any questions about your discharge instructions: Diet   No. Medications  No. Follow up visit  No.  Do you have questions or concerns about your Care? No.  Actions: * If pain score is 4 or above: No action needed, pain <4.  No problems per the pt. maw

## 2014-08-06 LAB — C-REACTIVE PROTEIN: CRP: <0.5 mg/dL (ref 0.5–20.0)

## 2014-08-08 NOTE — Progress Notes (Signed)
Quick Note:  Please inform the patient that lab work was normal and to continue current plan of action. Awaiting biopsy results ______

## 2014-08-10 ENCOUNTER — Encounter: Payer: Self-pay | Admitting: Gastroenterology

## 2014-08-16 ENCOUNTER — Encounter: Payer: Self-pay | Admitting: Gastroenterology

## 2014-08-18 ENCOUNTER — Telehealth: Payer: Self-pay | Admitting: Gastroenterology

## 2014-08-18 NOTE — Telephone Encounter (Signed)
Patient advised of none revealing results as per letters. She will see Amy E on 09/01/14.

## 2014-09-01 ENCOUNTER — Ambulatory Visit: Payer: 59 | Admitting: Physician Assistant

## 2014-09-05 ENCOUNTER — Encounter: Payer: Self-pay | Admitting: Family

## 2014-09-05 ENCOUNTER — Ambulatory Visit (INDEPENDENT_AMBULATORY_CARE_PROVIDER_SITE_OTHER): Payer: 59 | Admitting: Family

## 2014-09-05 VITALS — BP 140/88 | HR 67 | Temp 97.8°F | Resp 18 | Ht 65.0 in | Wt 176.8 lb

## 2014-09-05 DIAGNOSIS — J019 Acute sinusitis, unspecified: Secondary | ICD-10-CM

## 2014-09-05 DIAGNOSIS — R4589 Other symptoms and signs involving emotional state: Secondary | ICD-10-CM

## 2014-09-05 DIAGNOSIS — F329 Major depressive disorder, single episode, unspecified: Secondary | ICD-10-CM

## 2014-09-05 MED ORDER — MOMETASONE FUROATE 50 MCG/ACT NA SUSP: 2.0000 | Freq: Every day | NASAL | Status: DC

## 2014-09-05 MED ORDER — HYDROCODONE-HOMATROPINE 5-1.5 MG/5ML PO SYRP: 5.0000 mL | ORAL_SOLUTION | Freq: Three times a day (TID) | ORAL | Status: DC | PRN

## 2014-09-05 MED ORDER — BUPROPION HCL ER (XL) 150 MG PO TB24: 150.0000 mg | ORAL_TABLET | Freq: Every day | ORAL | Status: DC

## 2014-09-05 MED ORDER — AMOXICILLIN-POT CLAVULANATE 875-125 MG PO TABS: 1.0000 | ORAL_TABLET | Freq: Two times a day (BID) | ORAL | Status: DC

## 2014-09-05 NOTE — Assessment & Plan Note (Signed)
Patient currently experiencing a depressed mood secondary to life stressors. Start Wellbutrin at patient request. Patient advised if thoughts of suicide develop to seek emergency medical care and stop medication. Follow-up in one month.

## 2014-09-05 NOTE — Progress Notes (Signed)
Pre visit review using our clinic review tool, if applicable. No additional management support is needed unless otherwise documented below in the visit note. 

## 2014-09-05 NOTE — Patient Instructions (Addendum)
Thank you for choosing [facility].  Summary/Instructions:  Your prescription(s) have been submitted to your pharmacy or been printed and provided for you. Please take as directed and contact our office if you believe you are having problem(s) with the medication(s) or have any questions.  Please stop by the lab on the basement level of the building for your blood work. Your results will be released to [place] (or called to you) after review, usually within 72 hours after test completion. If any changes need to be made, you will be notified at that same time.  Please stop by radiology on the basement level of the building for your x-rays. Your results will be released to [place] (or called to you) after review, usually within 72 hours after test completion. If any treatments or changes are necessary, you will be notified at that same time.  Referrals have been made during this visit. You should expect to hear back from our schedulers in about 7-10 days in regards to establishing an appointment with the specialists we discussed.   If your symptoms worsen or fail to improve, please contact our office for further instruction, or in case of emergency go directly to the emergency room at the closest medical facility.    General Recommendations:    Please drink plenty of fluids.  Get plenty of rest   Sleep in humidified air  Use saline nasal sprays  Netti pot   OTC Medications:  Decongestants - helps relieve congestion   Flonase (generic fluticasone) or Nasacort (generic triamcinolone) - please make sure to use the "cross-over" technique at a 45 degree angle towards the opposite eye as opposed to straight up the nasal passageway.   Sudafed (generic pseudoephedrine - Note this is the one that is available behind the pharmacy counter); Products with phenylephrine (-PE) may also be used but is often not as effective as pseudoephedrine.   If you have HIGH BLOOD PRESSURE - Coricidin  HBP; AVOID any product that is -D as this contains pseudoephedrine which may increase your blood pressure.  Afrin (oxymetazoline) every 6-8 hours for up to 3 days.   Allergies - helps relieve runny nose, itchy eyes and sneezing   Claritin (generic loratidine), Allegra (fexofenidine), or Zyrtec (generic cyrterizine) for runny nose. These medications should not cause drowsiness.  Note - Benadryl (generic diphenhydramine) may be used however may cause drowsiness  Cough -   Delsym or Robitussin (generic dextromethorphan)  Expectorants - helps loosen mucus to ease removal   Mucinex (generic guaifenesin) as directed on the package.  Headaches / General Aches   Tylenol (generic acetaminophen) - DO NOT EXCEED 3 grams (3,000 mg) in a 24 hour time period  Advil/Motrin (generic ibuprofen)   Sore Throat -   Salt water gargle   Chloraseptic (generic benzocaine) spray or lozenges / Sucrets (generic dyclonine)    Sinusitis Sinusitis is redness, soreness, and inflammation of the paranasal sinuses. Paranasal sinuses are air pockets within the bones of your face (beneath the eyes, the middle of the forehead, or above the eyes). In healthy paranasal sinuses, mucus is able to drain out, and air is able to circulate through them by way of your nose. However, when your paranasal sinuses are inflamed, mucus and air can become trapped. This can allow bacteria and other germs to grow and cause infection. Sinusitis can develop quickly and last only a short time (acute) or continue over a long period (chronic). Sinusitis that lasts for more than 12 weeks is considered  chronic.  CAUSES  Causes of sinusitis include: 4. Allergies. 5. Structural abnormalities, such as displacement of the cartilage that separates your nostrils (deviated septum), which can decrease the air flow through your nose and sinuses and affect sinus drainage. 6. Functional abnormalities, such as when the small hairs (cilia) that  line your sinuses and help remove mucus do not work properly or are not present. SIGNS AND SYMPTOMS  Symptoms of acute and chronic sinusitis are the same. The primary symptoms are pain and pressure around the affected sinuses. Other symptoms include:  Upper toothache.  Earache.  Headache.  Bad breath.  Decreased sense of smell and taste.  A cough, which worsens when you are lying flat.  Fatigue.  Fever.  Thick drainage from your nose, which often is green and may contain pus (purulent).  Swelling and warmth over the affected sinuses. DIAGNOSIS  Your health care provider will perform a physical exam. During the exam, your health care provider may:  Look in your nose for signs of abnormal growths in your nostrils (nasal polyps).  Tap over the affected sinus to check for signs of infection.  View the inside of your sinuses (endoscopy) using an imaging device that has a light attached (endoscope). If your health care provider suspects that you have chronic sinusitis, one or more of the following tests may be recommended:  Allergy tests.  Nasal culture. A sample of mucus is taken from your nose, sent to a lab, and screened for bacteria.  Nasal cytology. A sample of mucus is taken from your nose and examined by your health care provider to determine if your sinusitis is related to an allergy. TREATMENT  Most cases of acute sinusitis are related to a viral infection and will resolve on their own within 10 days. Sometimes medicines are prescribed to help relieve symptoms (pain medicine, decongestants, nasal steroid sprays, or saline sprays).  However, for sinusitis related to a bacterial infection, your health care provider will prescribe antibiotic medicines. These are medicines that will help kill the bacteria causing the infection.  Rarely, sinusitis is caused by a fungal infection. In theses cases, your health care provider will prescribe antifungal medicine. For some cases of  chronic sinusitis, surgery is needed. Generally, these are cases in which sinusitis recurs more than 3 times per year, despite other treatments. HOME CARE INSTRUCTIONS   Drink plenty of water. Water helps thin the mucus so your sinuses can drain more easily.  Use a humidifier.  Inhale steam 3 to 4 times a day (for example, sit in the bathroom with the shower running).  Apply a warm, moist washcloth to your face 3 to 4 times a day, or as directed by your health care provider.  Use saline nasal sprays to help moisten and clean your sinuses.  Take medicines only as directed by your health care provider.  If you were prescribed either an antibiotic or antifungal medicine, finish it all even if you start to feel better. SEEK IMMEDIATE MEDICAL CARE IF:  You have increasing pain or severe headaches.  You have nausea, vomiting, or drowsiness.  You have swelling around your face.  You have vision problems.  You have a stiff neck.  You have difficulty breathing. MAKE SURE YOU:   Understand these instructions.  Will watch your condition.  Will get help right away if you are not doing well or get worse. Document Released: 08/19/2005 Document Revised: 01/03/2014 Document Reviewed: 09/03/2011 [facility] Patient Information 2015 [place], [place]. This information is  not intended to replace advice given to you by your health care provider. Make sure you discuss any questions you have with your health care provider.   Depression Depression refers to feeling sad, low, down in the dumps, blue, gloomy, or empty. In general, there are two kinds of depression: 7. Normal sadness or normal grief. This kind of depression is one that we all feel from time to time after upsetting life experiences, such as the loss of a job or the ending of a relationship. This kind of depression is considered normal, is short lived, and resolves within a few days to 2 weeks. Depression experienced after the loss of a  loved one (bereavement) often lasts longer than 2 weeks but normally gets better with time. 8. Clinical depression. This kind of depression lasts longer than normal sadness or normal grief or interferes with your ability to function at home, at work, and in school. It also interferes with your personal relationships. It affects almost every aspect of your life. Clinical depression is an illness. Symptoms of depression can also be caused by conditions other than those mentioned above, such as:  Physical illness. Some physical illnesses, including underactive thyroid gland (hypothyroidism), severe anemia, specific types of cancer, diabetes, uncontrolled seizures, heart and lung problems, strokes, and chronic pain are commonly associated with symptoms of depression.  Side effects of some prescription medicine. In some people, certain types of medicine can cause symptoms of depression.  Substance abuse. Abuse of alcohol and illicit drugs can cause symptoms of depression. SYMPTOMS Symptoms of normal sadness and normal grief include the following:  Feeling sad or crying for short periods of time.  Not caring about anything (apathy).  Difficulty sleeping or sleeping too much.  No longer able to enjoy the things you used to enjoy.  Desire to be by oneself all the time (social isolation).  Lack of energy or motivation.  Difficulty concentrating or remembering.  Change in appetite or weight.  Restlessness or agitation. Symptoms of clinical depression include the same symptoms of normal sadness or normal grief and also the following symptoms:  Feeling sad or crying all the time.  Feelings of guilt or worthlessness.  Feelings of hopelessness or helplessness.  Thoughts of suicide or the desire to harm yourself (suicidal ideation).  Loss of touch with reality (psychotic symptoms). Seeing or hearing things that are not real (hallucinations) or having false beliefs about your life or the  people around you (delusions and paranoia). DIAGNOSIS  The diagnosis of clinical depression is usually based on how bad the symptoms are and how long they have lasted. Your health care provider will also ask you questions about your medical history and substance use to find out if physical illness, use of prescription medicine, or substance abuse is causing your depression. Your health care provider may also order blood tests. TREATMENT  Often, normal sadness and normal grief do not require treatment. However, sometimes antidepressant medicine is given for bereavement to ease the depressive symptoms until they resolve. The treatment for clinical depression depends on how bad the symptoms are but often includes antidepressant medicine, counseling with a mental health professional, or both. Your health care provider will help to determine what treatment is best for you. Depression caused by physical illness usually goes away with appropriate medical treatment of the illness. If prescription medicine is causing depression, talk with your health care provider about stopping the medicine, decreasing the dose, or changing to another medicine. Depression caused by the abuse  of alcohol or illicit drugs goes away when you stop using these substances. Some adults need professional help in order to stop drinking or using drugs. SEEK IMMEDIATE MEDICAL CARE IF:  You have thoughts about hurting yourself or others.  You lose touch with reality (have psychotic symptoms).  You are taking medicine for depression and have a serious side effect. FOR MORE INFORMATION  National Alliance on Mental Illness: www.nami.[employer] of Mental Health: https://carter.com/ Document Released: 08/16/2000 Document Revised: 01/03/2014 Document Reviewed: 11/18/2011 [facility] Patient Information 2015 [place], [place]. This information is not intended to replace advice given to you by your health care provider. Make sure you  discuss any questions you have with your health care provider.

## 2014-09-05 NOTE — Progress Notes (Signed)
Subjective:    Patient ID: Lindsey Ferrell, female    DOB: Aug 04, 1956, 59 y.o.   MRN: [id number]  Chief Complaint  Patient presents with  . Sinus issues    productive cough mainly at night, swollen eyes, ears feel full, feels swollen and tight in neck area and sinus feels swollen, sinus headaches x2 weeks  . Depression    HPI:  Lindsey Ferrell is a 59 y.o. female who presents today for an acute visit.   1) Acute symptoms of productive cough, swollen eyes, head fullness, and sinus headaches have been going on for approximately 2 weeks. Has tried Sudafed and motrin along with sinus nasal sprays. Symptoms tend to be worse at night with the productive cough. Indicates she originally got better then got worse and now has stayed about the same. Denies any recent antibiotic use.   2) Depressed feeling - indicates that she has been feeling down recently and has multiple stressors in her life. Explains that she is irritable at times and has been feeling fatigued. Has been going to sleep earlier than normal because it is a means to get away from her problems.  Describes decreased ability to exercise.   Allergies  Allergen Reactions  . Statins     myalgias  . Droperidol     In combination with Fentanyl as Innovar causing Facial drawing    Current Outpatient Prescriptions on File Prior to Visit  Medication Sig Dispense Refill  . ibuprofen (ADVIL,MOTRIN) 200 MG tablet Take 200 mg by mouth every 6 (six) hours as needed.    Marland Kitchen levothyroxine (SYNTHROID, LEVOTHROID) 137 MCG tablet TAKE 1 TABLET BY MOUTH EVERY DAY 90 tablet 1   No current facility-administered medications on file prior to visit.   Past Medical History  Diagnosis Date  . Sickle-cell trait   . ALLERGIC RHINITIS, SEASONAL   . HYPERLIPIDEMIA, BORDERLINE   . GERD   . THYROID NODULE, RIGHT   . Unspecified hypothyroidism   . Kidney stone 07/2013    recurrent  . Melanoma 2010    (R) knee area  . Allergy     Review of  Systems  Constitutional: Negative for fever and chills.  HENT: Positive for congestion, facial swelling, sinus pressure and sore throat.   Respiratory: Positive for cough.   Neurological: Positive for headaches.  Psychiatric/Behavioral: Positive for dysphoric mood. Negative for suicidal ideas.      Objective:    BP 140/88 mmHg  Pulse 67  Temp(Src) 97.8 F (36.6 C) (Oral)  Resp 18  Ht 5\' 5"  (1.651 m)  Wt 176 lb 12.8 oz (80.196 kg)  BMI 29.42 kg/m2  SpO2 97% Nursing note and vital signs reviewed.  Physical Exam  Constitutional: She is oriented to person, place, and time. She appears well-developed and well-nourished. No distress.  HENT:  Right Ear: Hearing, tympanic membrane, external ear and ear canal normal.  Left Ear: Hearing, tympanic membrane, external ear and ear canal normal.  Nose: Right sinus exhibits maxillary sinus tenderness and frontal sinus tenderness. Left sinus exhibits maxillary sinus tenderness and frontal sinus tenderness.  Mouth/Throat: Uvula is midline and mucous membranes are normal. Posterior oropharyngeal erythema present.  Neck: Neck supple.  Cardiovascular: Normal rate, regular rhythm, normal heart sounds and intact distal pulses.   Pulmonary/Chest: Effort normal and breath sounds normal.  Lymphadenopathy:    She has no cervical adenopathy.  Neurological: She is alert and oriented to person, place, and time.  Skin: Skin is warm and dry.  Psychiatric: She has a normal mood and affect. Her behavior is normal. Judgment and thought content normal.       Assessment & Plan:

## 2014-09-05 NOTE — Assessment & Plan Note (Signed)
Symptoms and exam consistent with bacterial sinusitis. Start Augmentin 10 days. Start Hycodan as needed for cough and sleep. Start Nasonex daily. Continue over-the-counter medications as needed for symptom relief. Patient provided information on medications and proper usage. Follow up if symptoms worsen or fail to improve.

## 2014-09-19 ENCOUNTER — Ambulatory Visit: Payer: 59 | Admitting: Physician Assistant

## 2014-09-19 ENCOUNTER — Encounter: Payer: Self-pay | Admitting: *Deleted

## 2014-09-19 NOTE — Progress Notes (Unsigned)
Patient ID: Lindsey Ferrell, female   DOB: [birth year], 59 y.o.   MRN: [id number] This patient was reschedule for this visit today 09-19-2014 from a no show visit on 09-01-2014.  This patient no showed today 09-19-2014.  Per Nicoletta Ba PA- do not schedule her with extender again.

## 2014-10-06 ENCOUNTER — Ambulatory Visit: Payer: 59 | Admitting: Family

## 2014-12-17 ENCOUNTER — Other Ambulatory Visit: Payer: Self-pay | Admitting: Family

## 2014-12-25 ENCOUNTER — Other Ambulatory Visit: Payer: Self-pay | Admitting: Family

## 2015-01-09 ENCOUNTER — Other Ambulatory Visit: Payer: Self-pay

## 2015-01-10 LAB — CYTOLOGY - PAP

## 2015-03-07 ENCOUNTER — Telehealth: Payer: Self-pay | Admitting: Cardiology

## 2015-03-07 ENCOUNTER — Telehealth: Payer: Self-pay | Admitting: Internal Medicine

## 2015-03-07 DIAGNOSIS — R55 Syncope and collapse: Secondary | ICD-10-CM

## 2015-03-07 NOTE — Telephone Encounter (Signed)
Pt yesterday had an event happen,  Instant headache, visual disturbances (room was shaking), felt out of sorts, Left leg pain, and numbness from one shoulder to the other. Fatigue and lethargic feeling for the rest of the day.

## 2015-03-07 NOTE — Telephone Encounter (Signed)
LVM for pt to call back as soon as possible.   

## 2015-03-07 NOTE — Telephone Encounter (Signed)
Patient would like for you to call her °

## 2015-03-07 NOTE — Telephone Encounter (Signed)
Returned call to patient she stated she had a episode yesterday of heart racing,dizzy,slight pain in head.Stated episode lasted appox 1 min.Stated she just don't feel right today no racing heart.Stated she will be going out of town this Saturday and would like to be seen.Appointment scheduled with Tarri Fuller PA 03/08/15 at 3:30 pm.Advised to go to ER if needed.

## 2015-03-07 NOTE — Telephone Encounter (Signed)
New Message  Pt calling to speak w/ RN about current condition. Per pt- feeling like heart is racing to the point of almost passing out- "numb feeling in limbs" "feels like everything in body is quitting" Pt feels like she is about to pass out but doesn't- feels not totally in control. Please call back and discuss.

## 2015-03-08 ENCOUNTER — Encounter: Payer: Self-pay | Admitting: Physician Assistant

## 2015-03-08 ENCOUNTER — Other Ambulatory Visit (INDEPENDENT_AMBULATORY_CARE_PROVIDER_SITE_OTHER): Payer: 59

## 2015-03-08 ENCOUNTER — Ambulatory Visit (INDEPENDENT_AMBULATORY_CARE_PROVIDER_SITE_OTHER): Payer: 59 | Admitting: Physician Assistant

## 2015-03-08 VITALS — BP 118/64 | HR 80 | Ht 65.0 in | Wt 175.8 lb

## 2015-03-08 DIAGNOSIS — R55 Syncope and collapse: Secondary | ICD-10-CM

## 2015-03-08 LAB — CBC WITH DIFFERENTIAL/PLATELET
Basophils Absolute: 0 10*3/uL (ref 0.0–0.1)
Basophils Relative: 0.7 % (ref 0.0–3.0)
Eosinophils Absolute: 0.2 10*3/uL (ref 0.0–0.7)
Eosinophils Relative: 3.1 % (ref 0.0–5.0)
HEMATOCRIT: 42.3 % (ref 36.0–46.0)
HEMOGLOBIN: 14.2 g/dL (ref 12.0–15.0)
LYMPHS ABS: 1.7 10*3/uL (ref 0.7–4.0)
Lymphocytes Relative: 28.9 % (ref 12.0–46.0)
MCHC: 33.6 g/dL (ref 30.0–36.0)
MCV: 84.9 fl (ref 78.0–100.0)
MONO ABS: 0.6 10*3/uL (ref 0.1–1.0)
Monocytes Relative: 10.5 % (ref 3.0–12.0)
NEUTROS PCT: 56.8 % (ref 43.0–77.0)
Neutro Abs: 3.3 10*3/uL (ref 1.4–7.7)
Platelets: 225 10*3/uL (ref 150.0–400.0)
RBC: 4.98 Mil/uL (ref 3.87–5.11)
RDW: 13.4 % (ref 11.5–15.5)
WBC: 5.8 10*3/uL (ref 4.0–10.5)

## 2015-03-08 LAB — URINALYSIS, ROUTINE W REFLEX MICROSCOPIC
Bilirubin Urine: NEGATIVE
HGB URINE DIPSTICK: NEGATIVE
Ketones, ur: NEGATIVE
NITRITE: NEGATIVE
RBC / HPF: NONE SEEN (ref 0–?)
SPECIFIC GRAVITY, URINE: 1.01 (ref 1.000–1.030)
TOTAL PROTEIN, URINE-UPE24: NEGATIVE
Urine Glucose: NEGATIVE
Urobilinogen, UA: 0.2 (ref 0.0–1.0)
pH: 6.5 (ref 5.0–8.0)

## 2015-03-08 LAB — BASIC METABOLIC PANEL
BUN: 15 mg/dL (ref 6–23)
CHLORIDE: 107 meq/L (ref 96–112)
CO2: 27 mEq/L (ref 19–32)
Calcium: 9.2 mg/dL (ref 8.4–10.5)
Creatinine, Ser: 0.67 mg/dL (ref 0.40–1.20)
GFR: 94.97 mL/min (ref >60.00)
Glucose, Bld: 97 mg/dL (ref 70–99)
Potassium: 4.4 mEq/L (ref 3.5–5.1)
Sodium: 140 mEq/L (ref 135–145)

## 2015-03-08 LAB — LIPID PANEL
CHOLESTEROL: 204 mg/dL — AB (ref 0–200)
HDL: 46.4 mg/dL (ref >39.00)
LDL CALC: 129 mg/dL — AB (ref 0–99)
NonHDL: 157.6
TRIGLYCERIDES: 144 mg/dL (ref 0.0–149.0)
Total CHOL/HDL Ratio: 4
VLDL: 28.8 mg/dL (ref 0.0–40.0)

## 2015-03-08 LAB — HEPATIC FUNCTION PANEL
ALT: 17 U/L (ref 0–35)
AST: 16 U/L (ref 0–37)
Albumin: 3.7 g/dL (ref 3.5–5.2)
Alkaline Phosphatase: 94 U/L (ref 39–117)
BILIRUBIN DIRECT: 0.1 mg/dL (ref 0.0–0.3)
Total Bilirubin: 0.5 mg/dL (ref 0.2–1.2)
Total Protein: 6.8 g/dL (ref 6.0–8.3)

## 2015-03-08 LAB — TROPONIN I
TNIDX: 0 ug/L (ref 0.00–0.06)
Troponin I: <0.01 ng/mL

## 2015-03-08 LAB — TSH: TSH: 1.18 u[IU]/mL (ref 0.35–4.50)

## 2015-03-08 NOTE — Patient Instructions (Addendum)
Medication Instructions:  Your physician recommends that you continue on your current medications as directed. Please refer to the Current Medication list given to you today.  Labwork: Your physician recommends that you have lab work today, Troponin.  Testing/Procedures: Your physician has recommended that you wear two week event monitor. Event monitors are medical devices that record the heart's electrical activity. Doctors most often [name] these monitors to diagnose arrhythmias. Arrhythmias are problems with the speed or rhythm of the heartbeat. The monitor is a small, portable device. You can wear one while you do your normal daily activities. This is usually used to diagnose what is causing palpitations/syncope (passing out).  Follow-Up: Your physician recommends that you schedule a follow-up appointment with Dr. [name] after event monitor is complete.    Any Other Special Instructions Will Be Listed Below (If Applicable).

## 2015-03-08 NOTE — Telephone Encounter (Signed)
Labs entered as below OV with cardiology PA 7/6 at 330 and pt instructed by cards to go to ED if worse/recurrent sx thanks

## 2015-03-08 NOTE — Progress Notes (Signed)
Patient ID: Lindsey Ferrell, female   DOB: 06-30-1956, 59 y.o.   MRN: [id number]    Date:  03/08/2015   ID:  Lindsey Ferrell, DOB 09/09/1955, MRN [id number]  PCP:  Lindsey Grant, MD  Primary Cardiologist:  Lindsey Ferrell  Chief complaint:  Dizziness presyncope   History of Present Illness: Lindsey Ferrell is a 59 y.o. female here for the evaluation of near syncope.  She was evaluated by Dr. Luther Ferrell over a year ago for same problem.  At that time she had 3 distinct episodes over the previous 6 months. Felt like a light switch was going off. Has not felt weak or sick. Doing normal activity. Last time in daughter bedroom, standing in closet, turned around and daughter noted appearance had changed. Mom  you are getting ready to pass out. Went away quickly. Scared her daughter. No diaphoresis, no clamminess. Very brief tunnel vision perhaps. Felt like she was swaying.   Prior episode, got out of car to [place] walking up to office Walking in a straight line. Few seconds duration. No diaphoresis.  Kidney stones in November 2014. Surgery. Dr. [name] Ferrell. Took allopurinol for a month but felt very depressed on this. Normally very active.   Prior nuclear stress test in September of 2013 was low risk, no ischemia. EKG is unchanged, sinus rhythm with left anterior fascicular block, no changes in intervals. Echocardiogram on 12/22/13 demonstrated ejection fraction of 65% with no other abnormalities. Feels a constant pressure in mid section.   Patient presents today with similar presyncopal episodes.  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 For the last 2 months she describes feeling wiped out for the rest of the day after exercising. For the last 2 days and particularly she was at work and had 2 episodes where she felt odd and became very dizzy with a rapid racing heart rate. She felt like she was going to pass out this lasted approximately 1 minute. Right after that her shoulders and her left  leg started to ache. She also had headache on the right side of her head.  She is under a large amount of stress at .  She is planning on going to [place] on Saturday to help her daughter move.  The patient currently denies nausea, vomiting, fever, chest pain, shortness of breath, orthopnea, PND, cough, congestion, abdominal pain, hematochezia, melena, claudication.  Wt Readings from Last 3 Encounters:  03/08/15 175 lb 12.8 oz (79.742 kg)  09/05/14 176 lb 12.8 oz (80.196 kg)  08/04/14 174 lb (78.926 kg)     Past Medical History  Diagnosis Date  . Sickle-cell trait   . ALLERGIC RHINITIS, SEASONAL   . HYPERLIPIDEMIA, BORDERLINE   . GERD   . THYROID NODULE, RIGHT   . Unspecified hypothyroidism   . Kidney stone 07/2013    recurrent  . Melanoma 2010    (R) knee area  . Allergy     Current Outpatient Prescriptions  Medication Sig Dispense Refill  . Cholecalciferol (VITAMIN D-3 PO) Take 2,000 mg by mouth daily.    Marland Kitchen ibuprofen (ADVIL,MOTRIN) 200 MG tablet Take 200 mg by mouth every 6 (six) hours as needed (for pain).     Marland Kitchen levothyroxine (SYNTHROID, LEVOTHROID) 137 MCG tablet TAKE 1 TABLET BY MOUTH EVERY DAY 90 tablet 1  . mometasone (NASONEX) 50 MCG/ACT nasal spray PLACE 2 SPRAYS INTO THE NOSE EVERY DAY. 17 g 0  . Multiple Vitamins-Minerals (CENTRUM SILVER PO) Take 1 tablet by  mouth daily.     No current facility-administered medications for this visit.    Allergies:    Allergies  Allergen Reactions  . Statins     myalgias  . Droperidol     In combination with Fentanyl as Innovar causing Facial drawing    Social History:  The patient  reports that she has never smoked. She has never used smokeless tobacco. She reports that she drinks alcohol. She reports that she does not use illicit drugs.   Family history:   Family History  Problem Relation Age of Onset  . Arthritis Mother   . Diverticulosis Mother   . Heart disease Mother   . Arthritis Father   . Heart disease Father    . Breast cancer Sister   . Diabetes Maternal Uncle   . Hypertension Other     parent & grandparent  . Heart disease Other     parent & grandparent  . Colon cancer Neg Hx   . Esophageal cancer Neg Hx   . Rectal cancer Neg Hx   . Stomach cancer Neg Hx     ROS:  Please see the history of present illness.  All other systems reviewed and negative.   PHYSICAL EXAM: VS:  BP 118/64 mmHg  Pulse 80  Ht 5\' 5"  (1.651 m)  Wt 175 lb 12.8 oz (79.742 kg)  BMI 29.25 kg/m2 Overweight, well developed, in no acute distress HEENT: Pupils are equal round react to light accommodation extraocular movements are intact.  Neck: no JVDNo cervical lymphadenopathy. Cardiac: Regular rate and rhythm without murmurs rubs or gallops. Lungs:  clear to auscultation bilaterally, no wheezing, rhonchi or rales Abd: soft, nontender, positive bowel sounds all quadrants, no hepatosplenomegaly Ext: no lower extremity edema.  2+ radial and dorsalis pedis pulses. Skin: warm and dry Neuro:  Grossly normal, cranial nerves II through XII intact. Strength is 5 out of 5 equal upper and lower extremities.  EKG:    Sinus rhythm rate 71 bpm  ASSESSMENT AND PLAN:  Presyncope: Patient has had 2 episodes of elevated heart rate and presyncope in the last 2 days.  She had a CMP and CBC today, which was ordered by her primary care provider. Both of these are within normal limits. She does have an elevated LDL cholesterol. TSH was within normal limits. In addition, I will check a troponin. I have ordered a two-week CardioNet monitor. As of today symptoms have resolved.  This could be arrhythmia or anxiety. 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  Neurologically she is intact.  Blood Pressures well-controlled.  Upper lipidemia.  Discussed low carb diet. She is intolerant to statins as she's had muscle aches.

## 2015-03-08 NOTE — Addendum Note (Signed)
Addended by: Ewell Poe L on: 03/08/2015 04:29 PM   Modules accepted: Orders

## 2015-03-09 ENCOUNTER — Ambulatory Visit (INDEPENDENT_AMBULATORY_CARE_PROVIDER_SITE_OTHER): Payer: 59

## 2015-03-09 DIAGNOSIS — R55 Syncope and collapse: Secondary | ICD-10-CM

## 2015-04-18 ENCOUNTER — Encounter: Payer: Self-pay | Admitting: Cardiology

## 2015-04-18 ENCOUNTER — Ambulatory Visit (INDEPENDENT_AMBULATORY_CARE_PROVIDER_SITE_OTHER): Payer: 59 | Admitting: Cardiology

## 2015-04-18 VITALS — BP 110/80 | HR 64 | Ht 65.5 in | Wt 175.0 lb

## 2015-04-18 DIAGNOSIS — R55 Syncope and collapse: Secondary | ICD-10-CM | POA: Diagnosis not present

## 2015-04-18 DIAGNOSIS — R0609 Other forms of dyspnea: Secondary | ICD-10-CM | POA: Diagnosis not present

## 2015-04-18 DIAGNOSIS — E785 Hyperlipidemia, unspecified: Secondary | ICD-10-CM

## 2015-04-18 NOTE — Progress Notes (Signed)
Patient ID: Lindsey Ferrell, female   DOB: 11-15-1955, 59 y.o.   MRN: [id number]    Date:  04/18/2015   ID:  Lindsey Ferrell, DOB 11/21/1955, MRN [id number]  PCP:  [name], MD  Primary Cardiologist:  [name]  Chief complaint:  Dizziness presyncope   History of Present Illness: Lindsey Ferrell is a 59 y.o. female here for the evaluation of near syncope.   At that time she had 3 distinct episodes over the previous 6 months. Felt like a light switch was going off. Has not felt weak or sick. Doing normal activity. Last time in daughter bedroom, standing in closet, turned around and daughter noted appearance had changed. Mom  you are getting ready to pass out. Went away quickly. Scared her daughter. No diaphoresis, no clamminess. Very brief tunnel vision perhaps. Felt like she was swaying.   Prior episode, got out of car to [place] walking up to office Walking in a straight line. Few seconds duration. No diaphoresis.  Kidney stones in November 2014. Surgery. Dr. [name] December. Took allopurinol for a month but felt very depressed on this. Normally very active.   Prior nuclear stress test in September of 2013 was low risk, no ischemia. EKG is unchanged, sinus rhythm with left anterior fascicular block, no changes in intervals. Echocardiogram on 12/22/13 demonstrated ejection fraction of 65% with no other abnormalities. Feels a constant pressure in mid section.   Pesyncopal episodes.  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 For the last 2 months she describes feeling wiped out for the rest of the day after exercising. For the last 2 days and particularly she was at work and had 2 episodes where she felt odd and became very dizzy with a rapid racing heart rate. She felt like she was going to pass out this lasted approximately 1 minute. Right after that her shoulders and her left leg started to ache. She also had headache on the right side of her head.    The patient currently  denies nausea, vomiting, fever, chest pain, shortness of breath, orthopnea, PND, cough, congestion, abdominal pain, hematochezia, melena, claudication.  Wt Readings from Last 3 Encounters:  04/18/15 175 lb (79.379 kg)  03/08/15 175 lb 12.8 oz (79.742 kg)  09/05/14 176 lb 12.8 oz (80.196 kg)     Past Medical History  Diagnosis Date  . Sickle-cell trait   . ALLERGIC RHINITIS, SEASONAL   . HYPERLIPIDEMIA, BORDERLINE   . GERD   . THYROID NODULE, RIGHT   . Unspecified hypothyroidism   . Kidney stone 07/2013    recurrent  . Melanoma 2010    (R) knee area  . Allergy     Current Outpatient Prescriptions  Medication Sig Dispense Refill  . Cholecalciferol (VITAMIN D-3 PO) Take 2,000 mg by mouth daily.    Marland Kitchen ibuprofen (ADVIL,MOTRIN) 200 MG tablet Take 200 mg by mouth every 6 (six) hours as needed (for pain).     Marland Kitchen levothyroxine (SYNTHROID, LEVOTHROID) 137 MCG tablet TAKE 1 TABLET BY MOUTH EVERY DAY 90 tablet 1  . mometasone (NASONEX) 50 MCG/ACT nasal spray PLACE 2 SPRAYS INTO THE NOSE EVERY DAY. 17 g 0  . Multiple Vitamins-Minerals (CENTRUM SILVER PO) Take 1 tablet by mouth daily.     No current facility-administered medications for this visit.    Allergies:    Allergies  Allergen Reactions  . Statins     myalgias  . Droperidol     In combination with  Fentanyl as Innovar causing Facial drawing    Social History:  The patient  reports that she has never smoked. She has never used smokeless tobacco. She reports that she drinks alcohol. She reports that she does not use illicit drugs.   Family history:   Family History  Problem Relation Age of Onset  . Arthritis Mother   . Diverticulosis Mother   . Heart disease Mother   . Arthritis Father   . Heart disease Father   . Breast cancer Sister   . Diabetes Maternal Uncle   . Hypertension Other     parent & grandparent  . Heart disease Other     parent & grandparent  . Colon cancer Neg Hx   . Esophageal cancer Neg Hx   .  Rectal cancer Neg Hx   . Stomach cancer Neg Hx     ROS:  Please see the history of present illness.  All other systems reviewed and negative.   PHYSICAL EXAM: VS:  BP 110/80 mmHg  Pulse 64  Ht 5' 5.5" (1.664 m)  Wt 175 lb (79.379 kg)  BMI 28.67 kg/m2 Overweight, well developed, in no acute distress HEENT: Pupils are equal round react to light accommodation extraocular movements are intact.  Neck: no JVDNo cervical lymphadenopathy. Cardiac: Regular rate and rhythm without murmurs rubs or gallops. Lungs:  clear to auscultation bilaterally, no wheezing, rhonchi or rales Abd: soft, nontender, positive bowel sounds all quadrants, no hepatosplenomegaly Ext: no lower extremity edema.  2+ radial and dorsalis pedis pulses. Skin: warm and dry Neuro:  Grossly normal, cranial nerves II through XII intact. Strength is 5 out of 5 equal upper and lower extremities.  EKG:    Sinus rhythm rate 71 bpm  ASSESSMENT AND PLAN:  Presyncope: Reassuring workup, life watch monitor reassuring, prior stress test reassuring, echocardiogram reviewed with her normal. Continue to monitor. Could certainly be neurocardiogenic.  Blood Pressures well-controlled.  Hyperlipidemia.  Discussed low carb diet. She is intolerant to statins as she's had muscle aches.  Dyspnea on exertion  Prior stress test reassuring. If this becomes worse or more worrisome she will let us know. She noted this on the incline at [facility] while walking.   [name], MD

## 2015-04-18 NOTE — Patient Instructions (Signed)
Medication Instructions:  Your physician recommends that you continue on your current medications as directed. Please refer to the Current Medication list given to you today.  Follow-Up: Follow up in 1 year with Dr. Skains.  You will receive a letter in the mail 2 months before you are due.  Please call us when you receive this letter to schedule your follow up appointment.  Thank you for choosing [facility]!!       

## 2015-05-03 ENCOUNTER — Encounter: Payer: Self-pay | Admitting: Gastroenterology

## 2015-05-03 ENCOUNTER — Ambulatory Visit (INDEPENDENT_AMBULATORY_CARE_PROVIDER_SITE_OTHER): Payer: 59 | Admitting: Gastroenterology

## 2015-05-03 VITALS — BP 118/84 | HR 72 | Ht 64.0 in | Wt 174.1 lb

## 2015-05-03 DIAGNOSIS — K909 Intestinal malabsorption, unspecified: Secondary | ICD-10-CM | POA: Diagnosis not present

## 2015-05-03 DIAGNOSIS — R197 Diarrhea, unspecified: Secondary | ICD-10-CM

## 2015-05-03 DIAGNOSIS — K9089 Other intestinal malabsorption: Secondary | ICD-10-CM | POA: Insufficient documentation

## 2015-05-03 DIAGNOSIS — K589 Irritable bowel syndrome without diarrhea: Secondary | ICD-10-CM

## 2015-05-03 MED ORDER — CHOLESTYRAMINE 4 G PO PACK: 4.0000 g | PACK | Freq: Two times a day (BID) | ORAL | Status: DC

## 2015-05-03 NOTE — Progress Notes (Addendum)
     05/03/2015 Lindsey Ferrell [id number] [birth year]   History of Present Illness:  This is a pleasant 59 year old female who was seen by myself in June 2015 for complaints of chronic diarrhea with urgency.  Stools for Cdiff were negative and stool pathogen panel and O&P negative as well.  Treated with Xifaxan for 14 days with no improvement.  Celiac labs weakly positive but subsequent EGD with small bowel biopsy negative for celiac.  Also had a colonoscopy with normal random biopsies.  Does not have a gallbladder.  Returns to our office today with ongoing complaints of the same issue.  Never has a formed stool.  Has a lot of urgency with stools and has experienced a handful of times when she's had incontinence.  She is having to plan her life and eating around her bathroom habits and has to always know where the restrooms are located.  Denies abdominal pain.  No other associated complaints.  Otherwise feels well.   Current Medications, Allergies, Past Medical History, Past Surgical History, Family History and Social History were reviewed in [employer] record.   Physical Exam: BP 118/84 mmHg  Pulse 72  Ht 5\' 4"  (1.626 m)  Wt 174 lb 2 oz (78.983 kg)  BMI 29.87 kg/m2 General: Well developed white female in no acute distress Head: Normocephalic and atraumatic Eyes:  Sclerae anicteric, conjunctiva pink  Ears: Normal auditory acuity Lungs: Clear throughout to auscultation Heart: Regular rate and rhythm Abdomen: Soft, non-distended.  Normal bowel sounds.  Non-tender.   Musculoskeletal: Symmetrical with no gross deformities  Extremities: No edema  Neurological: Alert oriented x 4, grossly non-ocal Psychological:  Alert and cooperative. 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  Extensive evaluation has rule out pathologic causes of diarrhea.  Discussed questran vs  Viberzi vs Lotronex.  Will try questran one packet BID first.  She will call back in 4 weeks with an update on her symptoms.

## 2015-05-03 NOTE — Patient Instructions (Signed)
We have sent in your prescription to your pharmacy Please call with an update in 4 weeks ask for [facility]

## 2015-05-06 ENCOUNTER — Other Ambulatory Visit: Payer: Self-pay | Admitting: Family

## 2015-05-10 NOTE — Progress Notes (Signed)
Reviewed and agree with management. Lindbergh Winkles D. Natahlia Hoggard, M.D., FACG  

## 2015-07-02 ENCOUNTER — Other Ambulatory Visit: Payer: Self-pay | Admitting: Family

## 2015-07-12 ENCOUNTER — Telehealth: Payer: Self-pay | Admitting: Gastroenterology

## 2015-07-13 NOTE — Telephone Encounter (Signed)
Is she using the [place] twice a day?  If not then she needs to and if she is already using twice a day then one of the options is to increase that to 3 times a day or try something different.  I think that she needs to be seen since her last OV was over 2 months ago, especially in regards to the abdominal pain since it does not look like we discussed that much at her last visit.  Thank you,  Jess

## 2015-07-13 NOTE — Telephone Encounter (Signed)
Spoke with patient and she would rather try something else since she is taking it BID now. Scheduled OV on 07/19/15 at 2:00 PM with Alonza Bogus, PA.

## 2015-07-13 NOTE — Telephone Encounter (Signed)
Left a message for patient to call back. 

## 2015-07-13 NOTE — Telephone Encounter (Signed)
Spoke with patient and she still has diarrhea . The Questran has helped decrease the urgency but never has a solid stool. Has diarrhea when gets up and 1 hour after eating plus other times also. Also, still has the ache, uncomfortable feeling right under her bra area. Sometimes it feels "hard " in that area. States the feeling used to come and go but is now constant and getting worse. She is asking if there is a test to look at this area. Please, advise.

## 2015-07-14 NOTE — Telephone Encounter (Signed)
Patient notified

## 2015-07-14 NOTE — Telephone Encounter (Signed)
Will just discuss at her visit.  Thank you,  Jess

## 2015-07-18 ENCOUNTER — Ambulatory Visit (INDEPENDENT_AMBULATORY_CARE_PROVIDER_SITE_OTHER): Payer: 59 | Admitting: Internal Medicine

## 2015-07-18 ENCOUNTER — Encounter: Payer: Self-pay | Admitting: Internal Medicine

## 2015-07-18 VITALS — BP 132/82 | HR 73 | Temp 98.2°F | Resp 16 | Wt 176.0 lb

## 2015-07-18 DIAGNOSIS — R197 Diarrhea, unspecified: Secondary | ICD-10-CM

## 2015-07-18 NOTE — Assessment & Plan Note (Signed)
Following with GI and has a follow-up with them tomorrow EGD, colonoscopy and blood work in the past normal. Stool studies normal GI feels this might be a combination of IBS and bile salt induced diarrhea Trail of cholestyramine helped urgency, but does not help diarrhea Sensitivity seems less likely given that her diet is already low and gluten products, but she could consider an elimination diet Lactose deficiency seems more probable-she can eliminate dairy, try Lactaid pills or consider getting tested GI has a couple of other medications she can try and encourage her to try those She can try a fiber supplement Also consider pancreatic insufficiency-she can discuss with GI after other measures have been tried

## 2015-07-18 NOTE — Progress Notes (Signed)
Pre visit review using our clinic review tool, if applicable. No additional management support is needed unless otherwise documented below in the visit note. 

## 2015-07-18 NOTE — Patient Instructions (Addendum)
Try Konsyl fiber.   Try eliminating gluten - less likely gluten sensitivity Try eliminating dairy or gets tested for lactose deficiency.  You can try over-counter lactaid pills if you eat dairy products.   Follow up with GI and try other options they recommend Consider pancreatic insufficiency.   Gluten-Free Diet for Celiac Disease Gluten is a protein found in wheat, rye, barley, and triticale (a cross between wheat and rye) grains. People with celiac disease need to have a gluten-free diet. With celiac disease, gluten interferes with the absorption of food and may also cause intestinal injury.  Strict compliance is important even during symptom-free periods. This means eliminating all foods with gluten from your diet permanently. This requires some significant changes but is very manageable. WHAT DO I NEED TO KNOW ABOUT A GLUTEN-FREE DIET?  Look for items labeled with "GF." Looking for GF will make it easier to identify products that are safe to eat.  Read all labels. Gluten may have been added as a minor ingredient where least expected, such as in shredded cheeses or ice creams. Always check food labels and investigate questionable ingredients. Talk to your dietitian or health care provider if you have questions about certain foods or need help finding GF foods.  Check when in doubt. If you are not sure whether an ingredient contains gluten, check with the manufacturer. Note that some manufacturers may change ingredients without notice. Always read labels.   Know how food is prepared. Since flour and cereal products are often used in the preparation of foods, it is important to be aware of the methods of preparation used, as well as the ingredients in the foods themselves. This is especially true when you are dining out. Ask restaurants if they have a gluten-free menu.  Watch for cross-contamination. Cross-contamination occurs when gluten-free foods come into contact with foods that contain  gluten. It often happens during the manufacturing process. Always check the ingredient list and for warnings on packages, such as "may contain gluten."  Eat a balanced diet. It is important to still get enough fiber, iron, and B vitamins in your diet. Look for enriched whole grain gluten-free products and continue to eat a well-balanced diet of the important non-grain items, such as vegetables, fruit, lean proteins, legumes, and dairy.  Consider taking a gluten-free multivitamin and mineral supplement. Discuss this with your health care provider. WHAT KEY WORDS HELP IDENTIFY GLUTEN? Know key words to help identify gluten. A dietitian can help you identify possible harmful ingredients in the foods you normally eat. Words to check for on food labels include:   Flour, enriched flour, bromated flour, white flour, durum flour, graham flour, phosphated flour, self-rising flour, semolina, or farina.  Starch, dextrin, modified food starch, or cereal.  Thickening, fillers, or emulsifiers.  Any kind of malt flavoring, extract, or syrup (malt is made from barley and includes malt vinegar, malted milk, and malted beverages).  Hydrolyzed vegetable protein. WHAT FOODS CAN I EAT? Below is a list of common foods that are allowed with a gluten-free diet.  Grains Products made from the following flours or grains:amaranth,bean flours, 100% buckwheat flour, corn, millet, nut flours or meals, GF oats, quinoa, rice, sorghum, teff, any all-purpose 100% GF flour mix, rice wafers, pure cornmeal tortillas, popcorn, some crackers, some chips, and hot cereals made from cornmeal. Ask your dietitian which specific hot and cold cereals are allowed. Hominy, rice or wild rice, and special GF pasta. Some Asian rice noodles or bean noodles. Arrowroot starch, corn  bran, corn flour, corn germ, cornmeal, corn starch, potato flour, potato starch flour, and rice bran. Rice flours: plain, brown, and sweet. Rice polish, soy flour,  tapioca starch. Vegetables All plain, fresh, frozen, or canned vegetables.  Fruits All fresh, frozen, canned, dried fruits, and fruit juices.  Meats and Other Protein Foods Meat, fish, poultry, or eggs prepared without added wheat, rye, barley, or triticale. Some luncheon meat and some frankfurters. Pure meat. All aged cheese, most processed cheese products, some cottage cheese, and some cream cheese. Dried beans, dried peas, and lentils.  Dairy Milk and yogurt made with allowed ingredients.  Beverages Coffee (regular or decaffeinated), tea, herbal tea (read label to be sure that no wheat flour has been added). Carbonated beverages and some root beers. Wine, sake, and distilled spirits, such as gin, vodka, and whiskey. GF beers and GF ciders.  Sweetsand Desserts Sugar, honey, some syrups, molasses, jelly, jam, plain hard candy, marshmallows, gumdrops, homemade candies free of wheat, rye, barley, or triticale. Coconut. Custard, some pudding mixes, and homemade puddings from cornstarch, rice, and tapioca. Gelatin desserts, sorbets, frozen ice pops, and sherbet. Cake, cookies, and other desserts prepared with allowed flours. Some commercial ice creams. Ask your dietitian about specific brands of dessert that are allowed.  Fats and Oils Butter, margarine, vegetable oil, sour cream not containing modified food starch, whipping cream, shortening, lard, cream, and some mayonnaise. Some commercial salad dressings. Peanut butter.  Other Homemade broth and soups made with allowed ingredients; some canned or frozen soups. Any other combination or prepared foods that do not contain gluten. Monosodium glutamate (MSG). Cider, rice, and wine vinegar. Baking soda and baking powder. Certain soy sauces (Tamari). Ask your dietitian about specific brands that are allowed. Nuts, coconut, chocolate, and pure cocoa powder. Salt, pepper, herbs, spices, extracts, and food colorings. The items listed above may not be  a complete list of allowed foods or beverages. Contact your dietitian for more options.  WHAT FOODS CAN I NOT EAT? Below is a list of common foods that are not allowed with a gluten-free diet.  Grains Barley, bran, bulgur, cracked wheat, graham, malt, matzo, wheat germ, and all wheat and rye cereals including spelt and kamut. Avoid cereals containing malt as a flavoring, such as rice cereal. Also avoid regular noodles, spaghetti, macaroni, and most packaged rice mixes, and all others containing wheat, rye, barley, or triticale.  Vegetables Most creamed vegetables, most vegetables canned in sauces, and any vegetables prepared with wheat, rye, barley, or triticale.  Fruits Thickened or prepared fruits and some pie fillings.  Meats and Other Protein Sources Any meat or meat alternative containing wheat, rye, barley, or gluten stabilizers (such as some hot dogs, salami, cold cuts, or sausage). Bread-containing products, such as Swiss steak, croquettes, and meatloaf. Most tuna canned in vegetable broth, [place] with hydrolyzed vegetable protein (HVP) injected as part of the basting, and any cheese product containing oat gum as an ingredient. Seitan. Imitation fish. Dairy Commercial chocolate milk, which may have cereal added, and malted milk. Beverages Certain cereal beverages. Beer and ciders (unless GF), ale, malted milk, and some root beers. Sweetsand Desserts Commercial candies containing wheat, rye, barley, or triticale. Certain toffees are dusted with wheat flour. Chocolate-coated nuts, which are often rolled in flour. Cakes, cookies, doughnuts, and pastries that are prepared with wheat, barley, rye, or triticale flour. Some commercial ice creams, ice cream flavors which contain cookies, crumbs, or cheesecake. Ice cream cones. Commercially prepared mixes for cakes, cookies, and other desserts  unless marked GF. Bread pudding and other puddings thickened with flour. Fats and Oils Some  commercial salad dressings and sour cream containing modified food starch.  Condiments Some curry powder, some dry seasoning mixes, some gravy extracts, some meat sauces, some ketchup, some prepared mustard, horseradish. Other All soups containing wheat, rye, barley, or triticale flour. Bouillon and bouillon cubes that contain HVP. Combination or prepared foods that contain gluten. Some soy sauce, some chip dips, and some chewing gum. Yeast extract (contains barley). Caramel color (may contain malt). The items listed above may not be a complete list of foods and beverages to avoid. Contact your dietitian for more information.   This information is not intended to replace advice given to you by your health care provider. Make sure you discuss any questions you have with your health care provider.   Document Released: 08/19/2005 Document Revised: 09/09/2014 Document Reviewed: 06/23/2013 Elsevier Interactive Patient Education [employer].

## 2015-07-18 NOTE — Progress Notes (Signed)
Subjective:    Patient ID: Lindsey Ferrell, female    DOB: May 01, 1956, 59 y.o.   MRN: [id number]  HPI  About three years ago she started having digestive issues.  She was having explosive diarrhea.  She did have fecal incontinence.  She saw GI June 2015 for chronic diarrhea with urgency.  At that time cdiff and stool pathogen panel was negative.  She was treated with Xifaxan for 14 days without improvement.  Celiac labs were weakly positive, but EGD with biopsy was negative for celiac.  A colonoscopy was normal, random biopsies were also negative/normal.  She saw GI in August 2016 with similar complaints.  Her stool is always loose and she has urgency and has had incontinence.  She denied abdominal pain.  GI felt her symptoms were likely related to IBS-D or bile salt induced diarrhea or a combo of both. She was prescribed cholestyramine. She has been taking medication daily as prescribed and has helped with the fecal urgency, but she is still having diarrhea. Every bowel movement is diarrhea. She states the stool sticks to the toilet bowl and she has to clean the toilet bowl after going to the bathroom. She feels the cholestyramine loads her and she does have frequent gas. She has had intermittent discomfort in the epigastric region. She has noticed certain foods cause more symptoms, but has not kept a food diary.   She tends to eat a diet high in protein.   She feels drained all the time.  She has gained weight, and this is slightly depressing.    Lightheadedness:  She had an episode of lightheadednes and palpitations.  She saw cardiology and wore a holter monitor, but it only recorded for 4 days out of the 30 days.  The holter for that time was normal.  She has not had those symptoms again.  In the past two weeks she has some an episode of head pain on the lateral aspects of her head associated with blurry vision during that time.  The symptoms last about 5 minutes.  She has not had them since  then.  Her sight has not been the same since.  She has not the blurry vision since.  She was wondering if she should see her eye doctor.    Medications and allergies reviewed with patient and updated if appropriate.  Patient Active Problem List   Diagnosis Date Noted  . IBS (irritable bowel syndrome) 05/03/2015  . 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 Sinusitis, acute 09/05/2014  . Depressed mood 09/05/2014  . Diarrhea 02/23/2014  . Plantar fasciitis of left foot 01/31/2014  . GERD 05/15/2010  . [place] 06/09/2009  . THYROID NODULE, RIGHT 12/21/2008  . UNSPECIFIED HYPOTHYROIDISM 12/21/2008  . SICKLE-CELL TRAIT 12/21/2008  . ALLERGIC RHINITIS 12/21/2008  . ASTHMA 12/21/2008    Current Outpatient Prescriptions on File Prior to Visit  Medication Sig Dispense Refill  . Cholecalciferol (VITAMIN D-3 PO) Take 2,000 mg by mouth daily.    . cholestyramine (QUESTRAN) 4 G packet Take 1 packet (4 g total) by mouth 2 (two) times daily. 60 each 2  . ibuprofen (ADVIL,MOTRIN) 200 MG tablet Take 200 mg by mouth every 6 (six) hours as needed (for pain).     Marland Kitchen levothyroxine (SYNTHROID, LEVOTHROID) 137 MCG tablet TAKE 1 TABLET BY MOUTH EVERY DAY 90 tablet 1  . mometasone (NASONEX) 50 MCG/ACT nasal spray PLACE 2 SPRAYS IN EACH NOSTRIL EVERY DAY. 17 g 0  . Multiple Vitamins-Minerals (  CENTRUM SILVER PO) Take 1 tablet by mouth daily.     No current facility-administered medications on file prior to visit.    Past Medical History  Diagnosis Date  . Sickle-cell trait   . ALLERGIC RHINITIS, SEASONAL   . HYPERLIPIDEMIA, BORDERLINE   . GERD   . THYROID NODULE, RIGHT   . Unspecified hypothyroidism   . Kidney stone 07/2013    recurrent  . Melanoma 2010    (R) knee area  . Allergy     Past Surgical History  Procedure Laterality Date  . Tonsillectomy      childhood  . Nasal sinus surgery    . Melanoma in situ excision  2010    (R) thigh   . Cholecystectomy  04/2005  .  Cystoscopy with retrograde pyelogram, ureteroscopy and stent placement Left 07/06/2013    Procedure: CYSTOSCOPY WITH RETROGRADE PYELOGRAM, URETEROSCOPY AND STENT PLACEMENT;  Surgeon: Molli Hazard, MD;  Location: [facility];  Service: Urology;  Laterality: Left;  . Holmium laser application Left [id number]    Procedure: HOLMIUM LASER APPLICATION;  Surgeon: Molli Hazard, MD;  Location: [facility];  Service: Urology;  Laterality: Left;  . Cystoscopy with retrograde pyelogram, ureteroscopy and stent placement Left 07/08/2013    Procedure: CYSTOSCOPY WITH LEFT  RETROGRADE PYELOGRAM  AND STENT PLACEMENT;  Surgeon: Ardis Hughs, MD;  Location: [facility];  Service: Urology;  Laterality: Left;  . Cystoscopy with retrograde pyelogram, ureteroscopy and stent placement Left 07/16/2013    Procedure: CYSTOSCOPY WITH RETROGRADE PYELOGRAM, URETEROSCOPY AND Stone removal, stent exchange;  Surgeon: Molli Hazard, MD;  Location: [facility];  Service: Urology;  Laterality: Left;  . Esophagogastroduodenoscopy  07/12/2010  . Colonoscopy  08/05/2012    tics only    Social History   Social History  . Marital Status: Married    Spouse Name: N/A  . Number of Children: N/A  . Years of Education: N/A   Social History Main Topics  . Smoking status: Never Smoker   . Smokeless tobacco: Never Used     Comment: Married, 2 grown dtr with g-kids plus 2 adopted children, living at home  . Alcohol Use: Yes     Comment: Occassional  . Drug Use: No  . Sexual Activity: Not on file   Other Topics Concern  . Not on file   Social History Narrative    Review of Systems  Constitutional: Positive for fatigue. Negative for fever, chills and appetite change (increased).  Respiratory: Negative for cough, shortness of breath and wheezing.   Cardiovascular: Negative for chest pain, palpitations and leg swelling.  Gastrointestinal: Positive for diarrhea. Negative for nausea and blood in stool.       Abdominal  discomfort, not pain  Musculoskeletal: Positive for back pain (lower back pain/discomfort at times).       Plantar fasciitis  Neurological: Negative for dizziness, light-headedness and headaches.       Objective:   Filed Vitals:   07/18/15 0837  BP: 132/82  Pulse: 73  Temp: 98.2 F (36.8 C)  Resp: 16   Filed Weights   07/18/15 0837  Weight: 176 lb (79.833 kg)   Body mass index is 30.2 kg/(m^2).   Physical Exam  Constitutional: She appears well-developed and well-nourished. No distress.  Abdominal: Soft. Bowel sounds are normal. She exhibits no distension and no mass. There is no tenderness.  Musculoskeletal: She exhibits no edema.  Skin: Skin is warm and dry. She is not diaphoretic.  Assessment & Plan:   Lightheadedness, palpitaitons She has only had one episode and did have a Holter monitor, but it was only partially recorded At this point unless she has another episode I do not think she needs further evaluation-if she does have another episode she will warrant another Holter monitor  Head pain, blurry vision One episode, transient Discharge stay with the cause of this was, but I advised her to follow up with her eye doctor since she feels her vision has changed Return if she continues to experience these symptoms   See Problem List for further A/P

## 2015-07-19 ENCOUNTER — Ambulatory Visit (INDEPENDENT_AMBULATORY_CARE_PROVIDER_SITE_OTHER): Payer: 59 | Admitting: Gastroenterology

## 2015-07-19 ENCOUNTER — Encounter: Payer: Self-pay | Admitting: Gastroenterology

## 2015-07-19 ENCOUNTER — Other Ambulatory Visit: Payer: 59

## 2015-07-19 VITALS — BP 112/60 | Ht 64.0 in | Wt 178.0 lb

## 2015-07-19 DIAGNOSIS — R197 Diarrhea, unspecified: Secondary | ICD-10-CM | POA: Diagnosis not present

## 2015-07-19 NOTE — Progress Notes (Signed)
     07/19/2015 Lindsey Ferrell [id number] 1955-12-24   History of Present Illness:  Patient is here for follow-up of her diarrhea.  Please see my note from 05/03/2015 for details.  Has undergone extensive GI evaluation without any pathologic cause of diarrhea.  Presumed IBS-D +/- bile salt diarrhea.  Was placed on questran BID at last visit, which she has been taking religiously.  Says that it has helped with, but she still NEVER has a formed stool.  Stool sticks to the toilet bowl; sometimes floats and looks greasy but not often.  Would like to try a different medication.   Current Medications, Allergies, Past Medical History, Past Surgical History, Family History and Social History were reviewed in [employer] record.   Physical Exam: BP 112/60 mmHg  Ht 5\' 4"  (1.626 m)  Wt 178 lb (80.74 kg)  BMI 30.54 kg/m2 General: Well developed white female in no acute distress Head: Normocephalic and atraumatic Eyes:  Sclerae anicteric, conjunctiva pink  Ears: Normal auditory acuity Musculoskeletal: Symmetrical with no gross deformities  Extremities: No edema  Neurological: Alert oriented x 4, grossly non-focal Psychological:  Alert and cooperative. 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  Extensive evaluation has rule out pathologic causes of diarrhea.  Has tried Questran BID with minimal improvement.  Will check stool for fecal elastase.  Will await those results.  If positive then will treat with pancreatic enzymes and if negative then will try Viberzi 75 mg BID.  *Her care will be assumed by Dr. Havery Moros in Dr. Kelby Fam absence.

## 2015-07-19 NOTE — Progress Notes (Signed)
Agree with assessment and plan. If she has not had a stool collection for electrolytes this may be considered if she continues to fail multiple regimens without a clear source.

## 2015-07-19 NOTE — Patient Instructions (Signed)
Please go to the basement level to our lab.

## 2015-08-17 ENCOUNTER — Other Ambulatory Visit: Payer: Self-pay | Admitting: Family

## 2015-08-20 ENCOUNTER — Other Ambulatory Visit: Payer: Self-pay | Admitting: Internal Medicine

## 2015-09-13 ENCOUNTER — Other Ambulatory Visit: Payer: 59

## 2015-09-13 DIAGNOSIS — R197 Diarrhea, unspecified: Secondary | ICD-10-CM

## 2015-09-20 ENCOUNTER — Telehealth: Payer: Self-pay | Admitting: Gastroenterology

## 2015-09-20 LAB — PANCREATIC ELASTASE, FECAL: Pancreatic Elastase-1, Stool: >500 mcg/g

## 2015-09-20 NOTE — Telephone Encounter (Signed)
Patient is calling for lab results.

## 2015-09-21 NOTE — Telephone Encounter (Signed)
Patient given results and recommendations. She states she has stopped the Questran. She is trying something she found online- [employer] that she is drinking every AM and she has modified her diet also. Her stools are a little more formed. She wants to try this for a month. She did schedule an OV with Dr. Havery Moros on 10/31/15 at 4:00 PM.

## 2015-09-21 NOTE — Telephone Encounter (Signed)
Please let her know that her stool study was negative.  How is her diarrhea?  If she is still having issues despite the questran then let's discontinue that and try Viberzi 75 mg daily instead.  Then she will need a follow-up visit with myself or Dr. Havery Moros in about 4 weeks.  Thank you,  Jess

## 2015-10-07 ENCOUNTER — Encounter: Payer: Self-pay | Admitting: Internal Medicine

## 2015-10-07 ENCOUNTER — Ambulatory Visit (INDEPENDENT_AMBULATORY_CARE_PROVIDER_SITE_OTHER): Payer: 59 | Admitting: Internal Medicine

## 2015-10-07 VITALS — BP 110/80 | HR 73 | Temp 98.1°F | Ht 64.0 in | Wt 177.5 lb

## 2015-10-07 DIAGNOSIS — K591 Functional diarrhea: Secondary | ICD-10-CM | POA: Diagnosis not present

## 2015-10-07 MED ORDER — ELUXADOLINE 75 MG PO TABS: 1.0000 | ORAL_TABLET | Freq: Two times a day (BID) | ORAL | Status: DC

## 2015-10-07 NOTE — Patient Instructions (Addendum)
Try Viberzi twice a day  See your stomach doctor as planned  Please stop the new medication if you have side effects

## 2015-10-07 NOTE — Progress Notes (Signed)
Pre visit review using our clinic review tool, if applicable. No additional management support is needed unless otherwise documented below in the visit note. 

## 2015-10-07 NOTE — Progress Notes (Signed)
Subjective:    Patient ID: Lindsey Ferrell, female    DOB: 12-23-1955, 60 y.o.   MRN: [id number]  DOS:  10/07/2015 Type of visit - description : acute  Interval history: Symptoms started approximately 3 years ago with consistent diarrhea, often times postprandial, sometimes with inability to reach the bathroom   Review of Systems Denies fever chills. No blood in the stools. No nausea or vomiting. Occasional epigastric discomfort. No weight loss. Status post cholecystectomy.  Past Medical History  Diagnosis Date  . Sickle-cell trait ([place])   . ALLERGIC RHINITIS, SEASONAL   . HYPERLIPIDEMIA, BORDERLINE   . GERD   . THYROID NODULE, RIGHT   . Unspecified hypothyroidism   . Kidney stone 07/2013    recurrent  . Melanoma ([place]) 2010    (R) knee area  . Allergy     Past Surgical History  Procedure Laterality Date  . Tonsillectomy      childhood  . Nasal sinus surgery    . Melanoma in situ excision  2010    (R) thigh   . Cholecystectomy  04/2005  . Cystoscopy with retrograde pyelogram, ureteroscopy and stent placement Left 07/06/2013    Procedure: CYSTOSCOPY WITH RETROGRADE PYELOGRAM, URETEROSCOPY AND STENT PLACEMENT;  Surgeon: Molli Hazard, MD;  Location: [facility];  Service: Urology;  Laterality: Left;  . Holmium laser application Left [id number]    Procedure: HOLMIUM LASER APPLICATION;  Surgeon: Molli Hazard, MD;  Location: [facility];  Service: Urology;  Laterality: Left;  . Cystoscopy with retrograde pyelogram, ureteroscopy and stent placement Left 07/08/2013    Procedure: CYSTOSCOPY WITH LEFT  RETROGRADE PYELOGRAM  AND STENT PLACEMENT;  Surgeon: Ardis Hughs, MD;  Location: [facility];  Service: Urology;  Laterality: Left;  . Cystoscopy with retrograde pyelogram, ureteroscopy and stent placement Left 07/16/2013    Procedure: CYSTOSCOPY WITH RETROGRADE PYELOGRAM, URETEROSCOPY AND Stone removal, stent exchange;  Surgeon: Molli Hazard, MD;  Location: [facility];  Service: Urology;  Laterality: Left;  . Esophagogastroduodenoscopy  07/12/2010  . Colonoscopy  08/05/2012    tics only    Social History   Social History  . Marital Status: Married    Spouse Name: N/A  . Number of Children: N/A  . Years of Education: N/A   Occupational History  . Not on file.   Social History Main Topics  . Smoking status: Never Smoker   . Smokeless tobacco: Never Used     Comment: Married, 2 grown dtr with g-kids plus 2 adopted children, living at home  . Alcohol Use: Yes     Comment: Occassional  . Drug Use: No  . Sexual Activity: Not on file   Other Topics Concern  . Not on file   Social History Narrative        Medication List       This list is accurate as of: 10/07/15 11:59 PM.  Always use your most recent med list.               CENTRUM SILVER PO  Take 1 tablet by mouth daily.     Eluxadoline 75 MG Tabs  Commonly known as:  VIBERZI  Take 1 tablet by mouth 2 (two) times daily.     levothyroxine 137 MCG tablet  Commonly known as:  SYNTHROID, LEVOTHROID  TAKE 1 TABLET BY MOUTH EVERY DAY     VITAMIN D-3 PO  Take 2,000 mg by mouth daily.  Objective:   Physical Exam BP 110/80 mmHg  Pulse 73  Temp(Src) 98.1 F (36.7 C) (Oral)  Ht 5\' 4"  (1.626 m)  Wt 177 lb 8 oz (80.513 kg)  BMI 30.45 kg/m2  SpO2 96% General:   Well developed, well nourished . NAD.  HEENT:  Normocephalic . Face symmetric, atraumatic Lungs:  CTA B Normal respiratory effort, no intercostal retractions, no accessory muscle use. Heart: RRR,  no murmur.  no pretibial edema bilaterally  Abdomen:  Not distended, soft, non-tender. No rebound or rigidity. No mass,organomegaly Skin: Not pale. Not jaundice Neurologic:  alert & oriented X3.  Speech normal, gait appropriate for age and unassisted Psych--  Cognition and judgment appear intact.  Cooperative with normal attention span and concentration.  Behavior appropriate. No anxious or depressed  appearing.      Assessment & Plan:   Chronic diarrhea: Previously eval by GI, workup including a colonoscopy, EGD. Previously she was prescribed an antibiotic for possible bacterial overgrowth, most recently Questran which did not help. GI notes reviewed, next step was to try Viberzi. At this point, the patient states is desperate for help and is hoping something is done. Plan: Try Viberzi, watch for side effects, follow-up with GI 10/31/2015 as scheduled, report them if she got any benefit from the medicine

## 2015-10-31 ENCOUNTER — Ambulatory Visit: Payer: 59 | Admitting: Gastroenterology

## 2015-11-15 ENCOUNTER — Telehealth: Payer: 59 | Admitting: Family

## 2015-11-15 DIAGNOSIS — J0100 Acute maxillary sinusitis, unspecified: Secondary | ICD-10-CM | POA: Diagnosis not present

## 2015-11-15 MED ORDER — AMOXICILLIN-POT CLAVULANATE 875-125 MG PO TABS: 1.0000 | ORAL_TABLET | Freq: Two times a day (BID) | ORAL | Status: DC

## 2015-11-15 NOTE — Progress Notes (Signed)

## 2015-12-05 DIAGNOSIS — H524 Presbyopia: Secondary | ICD-10-CM | POA: Diagnosis not present

## 2015-12-05 DIAGNOSIS — H5203 Hypermetropia, bilateral: Secondary | ICD-10-CM | POA: Diagnosis not present

## 2015-12-07 ENCOUNTER — Other Ambulatory Visit: Payer: Self-pay | Admitting: Internal Medicine

## 2016-01-01 ENCOUNTER — Encounter: Payer: Self-pay | Admitting: Internal Medicine

## 2016-01-08 ENCOUNTER — Other Ambulatory Visit (INDEPENDENT_AMBULATORY_CARE_PROVIDER_SITE_OTHER): Payer: 59

## 2016-01-08 ENCOUNTER — Encounter: Payer: Self-pay | Admitting: Internal Medicine

## 2016-01-08 ENCOUNTER — Ambulatory Visit (INDEPENDENT_AMBULATORY_CARE_PROVIDER_SITE_OTHER): Payer: 59 | Admitting: Internal Medicine

## 2016-01-08 VITALS — BP 136/86 | HR 69 | Temp 98.3°F | Resp 16 | Wt 182.0 lb

## 2016-01-08 DIAGNOSIS — Z1159 Encounter for screening for other viral diseases: Secondary | ICD-10-CM

## 2016-01-08 DIAGNOSIS — R109 Unspecified abdominal pain: Secondary | ICD-10-CM

## 2016-01-08 DIAGNOSIS — K591 Functional diarrhea: Secondary | ICD-10-CM

## 2016-01-08 DIAGNOSIS — M2550 Pain in unspecified joint: Secondary | ICD-10-CM

## 2016-01-08 DIAGNOSIS — E559 Vitamin D deficiency, unspecified: Secondary | ICD-10-CM | POA: Insufficient documentation

## 2016-01-08 LAB — SEDIMENTATION RATE: SED RATE: 11 mm/h (ref 0–22)

## 2016-01-08 LAB — COMPREHENSIVE METABOLIC PANEL
ALBUMIN: 4.3 g/dL (ref 3.5–5.2)
ALT: 17 U/L (ref 0–35)
AST: 14 U/L (ref 0–37)
Alkaline Phosphatase: 87 U/L (ref 39–117)
BILIRUBIN TOTAL: 0.5 mg/dL (ref 0.2–1.2)
BUN: 16 mg/dL (ref 6–23)
CALCIUM: 9.7 mg/dL (ref 8.4–10.5)
CHLORIDE: 105 meq/L (ref 96–112)
CO2: 30 meq/L (ref 19–32)
Creatinine, Ser: 0.89 mg/dL (ref 0.40–1.20)
GFR: 68.25 mL/min (ref >60.00)
Glucose, Bld: 99 mg/dL (ref 70–99)
Potassium: 3.9 mEq/L (ref 3.5–5.1)
Sodium: 142 mEq/L (ref 135–145)
Total Protein: 7.3 g/dL (ref 6.0–8.3)

## 2016-01-08 LAB — CBC WITH DIFFERENTIAL/PLATELET
BASOS ABS: 0 10*3/uL (ref 0.0–0.1)
Basophils Relative: 0.4 % (ref 0.0–3.0)
EOS PCT: 1.7 % (ref 0.0–5.0)
Eosinophils Absolute: 0.1 10*3/uL (ref 0.0–0.7)
HCT: 41.6 % (ref 36.0–46.0)
HEMOGLOBIN: 14.2 g/dL (ref 12.0–15.0)
LYMPHS ABS: 1.9 10*3/uL (ref 0.7–4.0)
Lymphocytes Relative: 26.4 % (ref 12.0–46.0)
MCHC: 34.2 g/dL (ref 30.0–36.0)
MCV: 84.8 fl (ref 78.0–100.0)
MONO ABS: 0.7 10*3/uL (ref 0.1–1.0)
Monocytes Relative: 10.1 % (ref 3.0–12.0)
NEUTROS PCT: 61.4 % (ref 43.0–77.0)
Neutro Abs: 4.5 10*3/uL (ref 1.4–7.7)
Platelets: 239 10*3/uL (ref 150.0–400.0)
RBC: 4.91 Mil/uL (ref 3.87–5.11)
RDW: 13.5 % (ref 11.5–15.5)
WBC: 7.3 10*3/uL (ref 4.0–10.5)

## 2016-01-08 LAB — RHEUMATOID FACTOR: Rhuematoid fact SerPl-aCnc: <10 IU/mL (ref <=14)

## 2016-01-08 LAB — TSH: TSH: 2.69 u[IU]/mL (ref 0.35–4.50)

## 2016-01-08 LAB — VITAMIN B12: VITAMIN B 12: 490 pg/mL (ref 211–911)

## 2016-01-08 LAB — C-REACTIVE PROTEIN: CRP: 0.2 mg/dL — ABNORMAL LOW (ref 0.5–20.0)

## 2016-01-08 NOTE — Patient Instructions (Signed)
  Test(s) ordered today. Your results will be released to [place] (or called to you) after review, usually within 72hours after test completion. If any changes need to be made, you will be notified at that same time.    Medications reviewed and updated.  No changes recommended at this time.    Check into the FODMAP diet

## 2016-01-08 NOTE — Assessment & Plan Note (Signed)
30 minutes in morning - pain and swelling, stiffness Will check ANA, RF, ESR, CRP

## 2016-01-08 NOTE — Assessment & Plan Note (Signed)
One month of RUQ and epigastric pain after eating History of IBS-D - but never had pain or cramping until now Diarrhea 6 times a day, non-bloody x 4 years Check labs, abdominal US Will follow up with GI Was given rx for viberzi, but has not tried it

## 2016-01-08 NOTE — Assessment & Plan Note (Signed)
Check vit d level Not taken vitamin d daily

## 2016-01-08 NOTE — Progress Notes (Signed)
Subjective:    Patient ID: Lindsey Ferrell, female    DOB: 02/17/1956, 60 y.o.   MRN: [id number]  HPI She is here for an acute visit for stomach symptoms.  She has had diarrhea for four years.    She has been told in the past she has IBS.  She never pain or cramps in the past.  She just had diarrhea.  She would just have urgency with diarrhea.  Over the past month whenever she eats her stomach hurts.  She has pain in the epigastric region (ache pain) and then has residual ache in her RUQ and flank.    When she wakes up in the morning she is stiff and chae in her joints.  Her ankles are achey and she has to walk around for 30 min for it to get better.  Her legs ache.  Her fingers are puffy in the monring and feel slightly sweollen.  They improve after 30 mintues.    She is gained weight.  She is trying to walk.  She eats beef and sweets infrequently but she does have urgency with that.  She has diarrhea before she eats in the morning.  She does not wake up at night to have diarrhea.    She typically has 6 bouts of diarrhea a day. No blood.     Medications and allergies reviewed with patient and updated if appropriate.  Patient Active Problem List   Diagnosis Date Noted  . IBS (irritable bowel syndrome) 05/03/2015  . 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 Depressed mood 09/05/2014  . Diarrhea 02/23/2014  . Plantar fasciitis of left foot 01/31/2014  . GERD 05/15/2010  . [place] 06/09/2009  . THYROID NODULE, RIGHT 12/21/2008  . UNSPECIFIED HYPOTHYROIDISM 12/21/2008  . SICKLE-CELL TRAIT 12/21/2008  . ALLERGIC RHINITIS 12/21/2008  . ASTHMA 12/21/2008    Current Outpatient Prescriptions on File Prior to Visit  Medication Sig Dispense Refill  . Cholecalciferol (VITAMIN D-3 PO) Take 2,000 mg by mouth daily.    Marland Kitchen levothyroxine (SYNTHROID, LEVOTHROID) 137 MCG tablet TAKE 1 TABLET BY MOUTH EVERY DAY 90 tablet 1  . Multiple Vitamins-Minerals (CENTRUM SILVER PO)  Take 1 tablet by mouth daily.     No current facility-administered medications on file prior to visit.    Past Medical History  Diagnosis Date  . Sickle-cell trait ([place])   . ALLERGIC RHINITIS, SEASONAL   . HYPERLIPIDEMIA, BORDERLINE   . GERD   . THYROID NODULE, RIGHT   . Unspecified hypothyroidism   . Kidney stone 07/2013    recurrent  . Melanoma ([place]) 2010    (R) knee area  . Allergy     Past Surgical History  Procedure Laterality Date  . Tonsillectomy      childhood  . Nasal sinus surgery    . Melanoma in situ excision  2010    (R) thigh   . Cholecystectomy  04/2005  . Cystoscopy with retrograde pyelogram, ureteroscopy and stent placement Left 07/06/2013    Procedure: CYSTOSCOPY WITH RETROGRADE PYELOGRAM, URETEROSCOPY AND STENT PLACEMENT;  Surgeon: Molli Hazard, MD;  Location: [facility];  Service: Urology;  Laterality: Left;  . Holmium laser application Left [id number]    Procedure: HOLMIUM LASER APPLICATION;  Surgeon: Molli Hazard, MD;  Location: [facility];  Service: Urology;  Laterality: Left;  . Cystoscopy with retrograde pyelogram, ureteroscopy and stent placement Left 07/08/2013    Procedure: CYSTOSCOPY WITH LEFT  RETROGRADE PYELOGRAM  AND STENT PLACEMENT;  Surgeon: Ardis Hughs, MD;  Location: [facility];  Service: Urology;  Laterality: Left;  . Cystoscopy with retrograde pyelogram, ureteroscopy and stent placement Left 07/16/2013    Procedure: CYSTOSCOPY WITH RETROGRADE PYELOGRAM, URETEROSCOPY AND Stone removal, stent exchange;  Surgeon: Molli Hazard, MD;  Location: [facility];  Service: Urology;  Laterality: Left;  . Esophagogastroduodenoscopy  07/12/2010  . Colonoscopy  08/05/2012    tics only    Social History   Social History  . Marital Status: Married    Spouse Name: N/A  . Number of Children: N/A  . Years of Education: N/A   Social History Main Topics  . Smoking status: Never Smoker   . Smokeless tobacco: Never Used     Comment:  Married, 2 grown dtr with g-kids plus 2 adopted children, living at home  . Alcohol Use: Yes     Comment: Occassional  . Drug Use: No  . Sexual Activity: Not on file   Other Topics Concern  . Not on file   Social History Narrative    Family History  Problem Relation Age of Onset  . Arthritis Mother   . Diverticulosis Mother   . Heart disease Mother   . Arthritis Father   . Heart disease Father   . Breast cancer Sister   . Diabetes Maternal Uncle   . Hypertension Other     parent & grandparent  . Heart disease Other     parent & grandparent  . Colon cancer Neg Hx   . Esophageal cancer Neg Hx   . Rectal cancer Neg Hx   . Stomach cancer Neg Hx     Review of Systems  Constitutional: Positive for fatigue and unexpected weight change (weight gain). Negative for fever and appetite change.  Respiratory: Negative for cough, shortness of breath and wheezing.   Cardiovascular: Negative for chest pain, palpitations and leg swelling.  Gastrointestinal: Positive for abdominal pain, diarrhea and abdominal distention. Negative for blood in stool.       Rare gerd/reflux  Musculoskeletal: Positive for arthralgias.  Neurological: Negative for light-headedness and headaches.       Objective:   Filed Vitals:   01/08/16 1422  BP: 136/86  Pulse: 69  Temp: 98.3 F (36.8 C)  Resp: 16   Filed Weights   01/08/16 1422  Weight: 182 lb (82.555 kg)   Body mass index is 31.22 kg/(m^2).   Physical Exam Constitutional: Appears well-developed and well-nourished. No distress.  Abdomen:  Soft, minimal tenderness in epigastric region and RUQ without rebound or guarding, non-distended, no masses  Msk: no joint deformities or swelling in hands Ext: no edema     Assessment & Plan:   See Problem List for Assessment and Plan of chronic medical problems.

## 2016-01-09 LAB — ANTI-NUCLEAR AB-TITER (ANA TITER)

## 2016-01-09 LAB — ANA: ANA: POSITIVE — AB

## 2016-01-09 LAB — HEPATITIS C ANTIBODY: HCV Ab: NEGATIVE

## 2016-01-11 ENCOUNTER — Encounter: Payer: Self-pay | Admitting: Internal Medicine

## 2016-01-11 ENCOUNTER — Other Ambulatory Visit: Payer: Self-pay | Admitting: Internal Medicine

## 2016-01-11 DIAGNOSIS — R768 Other specified abnormal immunological findings in serum: Secondary | ICD-10-CM

## 2016-01-11 LAB — VITAMIN D 1,25 DIHYDROXY
VITAMIN D 1, 25 (OH) TOTAL: 53 pg/mL (ref 18–72)
VITAMIN D3 1, 25 (OH): 53 pg/mL

## 2016-01-12 ENCOUNTER — Encounter: Payer: Self-pay | Admitting: Internal Medicine

## 2016-01-19 ENCOUNTER — Ambulatory Visit
Admission: RE | Admit: 2016-01-19 | Discharge: 2016-01-19 | Disposition: A | Discharge status: Home / Self Care | Payer: 59 | Source: Ambulatory Visit | Attending: Internal Medicine | Admitting: Internal Medicine

## 2016-01-19 DIAGNOSIS — R109 Unspecified abdominal pain: Secondary | ICD-10-CM

## 2016-01-19 DIAGNOSIS — N200 Calculus of kidney: Secondary | ICD-10-CM | POA: Diagnosis not present

## 2016-01-20 ENCOUNTER — Encounter: Payer: Self-pay | Admitting: Internal Medicine

## 2016-02-07 ENCOUNTER — Encounter: Payer: Self-pay | Admitting: Internal Medicine

## 2016-02-08 ENCOUNTER — Encounter: Payer: Self-pay | Admitting: Internal Medicine

## 2016-02-08 DIAGNOSIS — R768 Other specified abnormal immunological findings in serum: Secondary | ICD-10-CM

## 2016-02-20 DIAGNOSIS — Z01419 Encounter for gynecological examination (general) (routine) without abnormal findings: Secondary | ICD-10-CM | POA: Diagnosis not present

## 2016-02-20 DIAGNOSIS — Z1231 Encounter for screening mammogram for malignant neoplasm of breast: Secondary | ICD-10-CM | POA: Diagnosis not present

## 2016-02-20 DIAGNOSIS — Z6830 Body mass index (BMI) 30.0-30.9, adult: Secondary | ICD-10-CM | POA: Diagnosis not present

## 2016-04-11 DIAGNOSIS — R1013 Epigastric pain: Secondary | ICD-10-CM | POA: Diagnosis not present

## 2016-04-11 DIAGNOSIS — R197 Diarrhea, unspecified: Secondary | ICD-10-CM | POA: Diagnosis not present

## 2016-04-11 DIAGNOSIS — R5383 Other fatigue: Secondary | ICD-10-CM | POA: Diagnosis not present

## 2016-04-11 DIAGNOSIS — R768 Other specified abnormal immunological findings in serum: Secondary | ICD-10-CM | POA: Diagnosis not present

## 2016-04-25 ENCOUNTER — Ambulatory Visit (INDEPENDENT_AMBULATORY_CARE_PROVIDER_SITE_OTHER): Payer: 59 | Admitting: Gastroenterology

## 2016-04-25 ENCOUNTER — Other Ambulatory Visit: Payer: 59

## 2016-04-25 ENCOUNTER — Encounter: Payer: Self-pay | Admitting: Gastroenterology

## 2016-04-25 VITALS — BP 110/76 | HR 64 | Ht 64.0 in | Wt 183.2 lb

## 2016-04-25 DIAGNOSIS — K529 Noninfective gastroenteritis and colitis, unspecified: Secondary | ICD-10-CM

## 2016-04-25 DIAGNOSIS — R140 Abdominal distension (gaseous): Secondary | ICD-10-CM | POA: Diagnosis not present

## 2016-04-25 MED ORDER — DIPHENOXYLATE-ATROPINE 2.5-0.025 MG PO TABS: 1.0000 | ORAL_TABLET | Freq: Three times a day (TID) | ORAL | 1 refills | Status: DC | PRN

## 2016-04-25 MED ORDER — RIFAXIMIN 550 MG PO TABS: 550.0000 mg | ORAL_TABLET | Freq: Three times a day (TID) | ORAL | 0 refills | Status: DC

## 2016-04-25 MED FILL — XIFAXAN 550 MG TABLET: 550 | 14 days supply | Qty: 42 | Fill #0

## 2016-04-25 MED FILL — DIPHENOXYLATE-ATROPINE TAB: 2.5-0.025 | 20 days supply | Qty: 60 | Fill #0

## 2016-04-25 NOTE — Patient Instructions (Signed)
Your physician has requested that you go to the basement for  lab work before leaving today.  We have sent the following medications to your pharmacy for you to pick up at your convenience:  Lomotil and Xifaxin   You have been given a [place] diet to follow.

## 2016-04-25 NOTE — Progress Notes (Signed)
HPI :  60 y/o female here for follow up for chronic diarrhea. New patient to me, previously followed by Dr. [name]. She has had negative infectious workup. Celiac labs weakly positive but EGD with small bowel biopsies negative. Colonoscopy with random biopsies normal without microscopic colitis. History of cholecystectomy. She tried [place] and more recently Viberzi.   Prior workup has also included normal CRP, negative GI pathogen panel, negative C Diff testing, TTG of 8, normal fecal elastase, normal CBC  She endorses chronic loose stools for 4 years. She reports shortly after eating she will need to have the urge to have a bowel movement and she has had episodes with exercise. She has had some rare incontinence due to urge. She thinks certain foods can trigger her symptoms, usually within a 1/2 hour after eating, she will have multiple bowel movements. She thinks 6-9 BMs per day, she denies nocturnal stools. She has tried some Citrucel more recently to help bulk her stools but it caused her to cramp. She has had some bloating bother her recently. She usually does not have much abdominal pain, but not severe. After a bowel movement it can sometimes take away her discomfort / bloating.   She was previously given Questran but states it did not help too much. She was prescribed Viberzi but did not fill it. She was given a trial of Rifaximin for a 2 week course 1-2 years ago but she states she did not take it. No FH of celiac disease. She has not tried a gluten free diet. She is interested in nuritional treatment plans. She has never tried lomotil. She has never been on a trial of Elavil.   She otherwise endorses chronic fatigue which bothers her, low energy levels. She has a positive ANA and reports a pending consult with Rheumatology.  Colonoscopy 08/2014 - normal with normal biopsies EGD 08/04/2014 - normal   Past Medical History:  Diagnosis Date  . ALLERGIC RHINITIS, SEASONAL   . Allergy   .  GERD   . HYPERLIPIDEMIA, BORDERLINE   . Kidney stone 07/2013   recurrent  . Melanoma ([place]) 2010   (R) knee area  . Sickle-cell trait ([place])   . THYROID NODULE, RIGHT   . Unspecified hypothyroidism      Past Surgical History:  Procedure Laterality Date  . CHOLECYSTECTOMY  04/2005  . COLONOSCOPY  08/05/2012   tics only  . CYSTOSCOPY WITH RETROGRADE PYELOGRAM, URETEROSCOPY AND STENT PLACEMENT Left 07/06/2013   Procedure: CYSTOSCOPY WITH RETROGRADE PYELOGRAM, URETEROSCOPY AND STENT PLACEMENT;  Surgeon: Molli Hazard, MD;  [facility];  Service: Urology;  Laterality: Left;  . CYSTOSCOPY WITH RETROGRADE PYELOGRAM, URETEROSCOPY AND STENT PLACEMENT Left 07/08/2013   Procedure: CYSTOSCOPY WITH LEFT  RETROGRADE PYELOGRAM  AND STENT PLACEMENT;  Surgeon: Ardis Hughs, MD;  Location: [facility];  Service: Urology;  Laterality: Left;  . CYSTOSCOPY WITH RETROGRADE PYELOGRAM, URETEROSCOPY AND STENT PLACEMENT Left 07/16/2013   Procedure: CYSTOSCOPY WITH RETROGRADE PYELOGRAM, URETEROSCOPY AND Stone removal, stent exchange;  Surgeon: Molli Hazard, MD;  Location[facility];  Service: Urology;  Laterality: Left;  . ESOPHAGOGASTRODUODENOSCOPY  07/12/2010  . HOLMIUM LASER APPLICATION Left [id number]   Procedure: HOLMIUM LASER APPLICATION;  Surgeon: Molli Hazard, MD;  [facility];  Service: Urology;  Laterality: Left;  Marland Kitchen Melanoma in situ excision  2010   (R) thigh   . NASAL SINUS SURGERY    . TONSILLECTOMY     childhood   Family History  Problem Relation Age of Onset  . Arthritis Mother   . Diverticulosis Mother   . Heart disease Mother   . Arthritis Father   . Heart disease Father   . Breast cancer Sister   . Diabetes Maternal Uncle   . Hypertension Other     parent & grandparent  . Heart disease Other     parent & grandparent  . Colon cancer Neg Hx   . Esophageal cancer Neg Hx   . Rectal cancer Neg Hx   . Stomach cancer Neg Hx    Social History  Substance Use  Topics  . Smoking status: Never Smoker  . Smokeless tobacco: Never Used     Comment: Married, 2 grown dtr with g-kids plus 2 adopted children, living at home  . Alcohol use Yes     Comment: Occassional   Current Outpatient Prescriptions  Medication Sig Dispense Refill  . levothyroxine (SYNTHROID, LEVOTHROID) 137 MCG tablet TAKE 1 TABLET BY MOUTH EVERY DAY 90 tablet 1   No current facility-administered medications for this visit.    Allergies  Allergen Reactions  . Statins     myalgias  . Droperidol     In combination with Fentanyl as Innovar causing Facial drawing     Review of Systems: All systems reviewed and negative except where noted in HPI.   Lab Results  Component Value Date   WBC 7.3 01/08/2016   HGB 14.2 01/08/2016   HCT 41.6 01/08/2016   MCV 84.8 01/08/2016   PLT 239.0 01/08/2016   Lab Results  Component Value Date   CREATININE 0.89 01/08/2016   BUN 16 01/08/2016   NA 142 01/08/2016   K 3.9 01/08/2016   CL 105 01/08/2016   CO2 30 01/08/2016    Lab Results  Component Value Date   ALT 17 01/08/2016   AST 14 01/08/2016   ALKPHOS 87 01/08/2016   BILITOT 0.5 01/08/2016      Physical Exam: BP 110/76 (BP Location: Left Arm, Patient Position: Sitting, Cuff Size: Normal)   Pulse 64   Ht 5\' 4"  (1.626 m)   Wt 183 lb 3.2 oz (83.1 kg)   BMI 31.45 kg/m  Constitutional: Pleasant,well-developed, female in no acute distress. HEENT: Normocephalic and atraumatic. Conjunctivae are normal. No scleral icterus. Neck supple.  Cardiovascular: Normal rate, regular rhythm.  Pulmonary/chest: Effort normal and breath sounds normal. No wheezing, rales or rhonchi. Abdominal: Soft, nondistended, nontender. Bowel sounds active throughout. There are no masses palpable.  Extremities: no edema Lymphadenopathy: No cervical adenopathy noted. Neurological: Alert and oriented to person place and time. Skin: Skin is warm and dry. No rashes noted. Psychiatric: Normal mood and  affect. Behavior is normal.   ASSESSMENT AND PLAN: 60 y/o female presenting for follow up for chronic diarrhea, history as outlined above. She has had negative infectious workup, normal fecal elastase, normal inflammatory markers, no anemia. Mildly positive celiac ABs but EGD and colonoscopy unrevealing. Symptoms are persistent and significant impacting her quality of life, afraid to eat any meals outside of her home. She has a tried a few regimens including cholestyramine which have not provided relief. We discussed ddx and treatment options, given workup to date this may be functional, but if no improvement following additional therapies can consider workup for more rare disorders. Recommend the following at this time. - trial of low FODMOP diet given ongoing symptoms of bloating as well - trial of Rifaximin 550mg  TID for 2 weeks (she previously did not take this when  given to her) - trial of lomotil PRN - will repeat TTG to see if this is persistently positive. If so, we could consider a capsule or genetic testing for celiac to more definitively rule it out, and may consider trial of gluten free diet.   She will contact us in one month if no improvement, otherwise follow up PRN.   Hecker Cellar, MD [facility] Pager [phone]

## 2016-04-26 ENCOUNTER — Encounter: Payer: Self-pay | Admitting: Gastroenterology

## 2016-04-26 LAB — TISSUE TRANSGLUTAMINASE, IGA: TISSUE TRANSGLUTAMINASE AB, IGA: 1 U/mL (ref <4)

## 2016-05-10 ENCOUNTER — Other Ambulatory Visit: Payer: Self-pay | Admitting: Family

## 2016-06-06 DIAGNOSIS — K580 Irritable bowel syndrome with diarrhea: Secondary | ICD-10-CM | POA: Diagnosis not present

## 2016-06-06 DIAGNOSIS — E669 Obesity, unspecified: Secondary | ICD-10-CM | POA: Diagnosis not present

## 2016-06-06 DIAGNOSIS — Z6831 Body mass index (BMI) 31.0-31.9, adult: Secondary | ICD-10-CM | POA: Diagnosis not present

## 2016-06-18 DIAGNOSIS — D2261 Melanocytic nevi of right upper limb, including shoulder: Secondary | ICD-10-CM | POA: Diagnosis not present

## 2016-06-18 DIAGNOSIS — L218 Other seborrheic dermatitis: Secondary | ICD-10-CM | POA: Diagnosis not present

## 2016-06-18 DIAGNOSIS — L738 Other specified follicular disorders: Secondary | ICD-10-CM | POA: Diagnosis not present

## 2016-06-20 ENCOUNTER — Encounter: Payer: Self-pay | Admitting: Cardiology

## 2016-06-20 ENCOUNTER — Other Ambulatory Visit: Payer: Self-pay | Admitting: Internal Medicine

## 2016-06-27 DIAGNOSIS — K580 Irritable bowel syndrome with diarrhea: Secondary | ICD-10-CM | POA: Diagnosis not present

## 2016-06-27 DIAGNOSIS — E669 Obesity, unspecified: Secondary | ICD-10-CM | POA: Diagnosis not present

## 2016-06-27 DIAGNOSIS — Z6831 Body mass index (BMI) 31.0-31.9, adult: Secondary | ICD-10-CM | POA: Diagnosis not present

## 2016-06-27 MED FILL — DIPHENOXYLATE-ATROPINE TAB: 2.5-0.025 | 20 days supply | Qty: 60 | Fill #1

## 2016-07-04 ENCOUNTER — Ambulatory Visit (INDEPENDENT_AMBULATORY_CARE_PROVIDER_SITE_OTHER): Payer: 59 | Admitting: Cardiology

## 2016-07-04 ENCOUNTER — Encounter: Payer: Self-pay | Admitting: Cardiology

## 2016-07-04 ENCOUNTER — Other Ambulatory Visit: Payer: Self-pay | Admitting: Internal Medicine

## 2016-07-04 ENCOUNTER — Other Ambulatory Visit: Payer: Self-pay | Admitting: Cardiology

## 2016-07-04 VITALS — BP 118/62 | HR 67 | Ht 65.0 in | Wt 186.0 lb

## 2016-07-04 DIAGNOSIS — E7800 Pure hypercholesterolemia, unspecified: Secondary | ICD-10-CM | POA: Diagnosis not present

## 2016-07-04 DIAGNOSIS — R55 Syncope and collapse: Secondary | ICD-10-CM

## 2016-07-04 DIAGNOSIS — M79605 Pain in left leg: Secondary | ICD-10-CM

## 2016-07-04 DIAGNOSIS — R0609 Other forms of dyspnea: Secondary | ICD-10-CM

## 2016-07-04 DIAGNOSIS — M79604 Pain in right leg: Secondary | ICD-10-CM

## 2016-07-04 NOTE — Progress Notes (Signed)
Patient ID: Lindsey Ferrell, female   DOB: [birth year], 60 y.o.   MRN: [id number]    Date:  07/04/2016   ID:  Lindsey Ferrell, DOB 1956/04/10, MRN [id number]  PCP:  Binnie Rail, MD  Primary Cardiologist:  [name]  Chief complaint:  Dizziness presyncope   History of Present Illness: Lindsey Ferrell is a 59 y.o. female here for follow up of near syncope.  This symptom has currently resolved.  Previously she had 3 distinct episodes. Felt like a light switch was going off. Has not felt weak or sick. Doing normal activity. Last time in daughter bedroom, standing in closet, turned around and daughter noted appearance had changed. Mom  you are getting ready to pass out. Went away quickly. Scared her daughter. No diaphoresis, no clamminess. Very brief tunnel vision perhaps. Felt like she was swaying.   Prior episode, got out of car to [place] walking up to office Walking in a straight line. Few seconds duration. No diaphoresis.  Kidney stones in November 2014. Surgery. Dr. [name] December. Took allopurinol for a month but felt very depressed on this. Normally very active.   Prior nuclear stress test in September of 2013 was low risk, no ischemia. EKG is unchanged, sinus rhythm with left anterior fascicular block, no changes in intervals. Echocardiogram on 12/22/13 demonstrated ejection fraction of 65% with no other abnormalities. Feels a constant pressure in mid section.   Pesyncopal episodes.  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 Previously described feeling wiped out for the rest of the day after exercising. Particularly she was at work and had 2 episodes where she felt odd and became very dizzy with a rapid racing heart rate. She felt like she was going to pass out this lasted approximately 1 minute. Right after that her shoulders and her left leg started to ache. She also had headache on the right side of her head.  The symptoms seem to have resolved.  Currently denies nausea,  vomiting, fever, chest pain, shortness of breath, orthopnea, PND, cough, congestion, abdominal pain, hematochezia, melena.  In AM joints ache in foot, left leg swells on occasion, worse in AM. At night does not sleep well. Snoring. Wakes up at night and knees down and elboys down feels like no blood flow. Different from going to sleep, no tingling. Infrequent burning in legs when walking. Sometimes feel like legs are not going to support her. IBS troubling her.   Wt Readings from Last 3 Encounters:  07/04/16 186 lb (84.4 kg)  04/25/16 183 lb 3.2 oz (83.1 kg)  01/08/16 182 lb (82.6 kg)     Past Medical History:  Diagnosis Date  . ALLERGIC RHINITIS, SEASONAL   . Allergy   . GERD   . HYPERLIPIDEMIA, BORDERLINE   . Kidney stone 07/2013   recurrent  . Melanoma ([place]) 2010   (R) knee area  . Sickle-cell trait ([place])   . THYROID NODULE, RIGHT   . Unspecified hypothyroidism     Current Outpatient Prescriptions  Medication Sig Dispense Refill  . diphenoxylate-atropine (LOMOTIL) 2.5-0.025 MG tablet Take 1 tablet by mouth every 8 (eight) hours as needed for diarrhea or loose stools. 60 tablet 1  . levothyroxine (SYNTHROID, LEVOTHROID) 137 MCG tablet TAKE 1 TABLET BY MOUTH EVERY DAY 90 tablet 0  . mometasone (NASONEX) 50 MCG/ACT nasal spray SHAKE LIQUID AND USE 2 SPRAYS IN EACH NOSTRIL EVERY DAY 17 g 0   No current facility-administered medications for this visit.  Allergies:    Allergies  Allergen Reactions  . Statins     myalgias  . Droperidol     In combination with Fentanyl as Innovar causing Facial drawing    Social History:  The patient  reports that she has never smoked. She has never used smokeless tobacco. She reports that she drinks alcohol. She reports that she does not use drugs.   Family history:   Family History  Problem Relation Age of Onset  . Arthritis Mother   . Diverticulosis Mother   . Heart disease Mother   . Arthritis Father   . Heart disease Father   .  Breast cancer Sister   . Diabetes Maternal Uncle   . Hypertension Other     parent & grandparent  . Heart disease Other     parent & grandparent  . Colon cancer Neg Hx   . Esophageal cancer Neg Hx   . Rectal cancer Neg Hx   . Stomach cancer Neg Hx     ROS:  Please see the history of present illness.  All other systems reviewed and negative.   PHYSICAL EXAM: VS:  BP 118/62   Pulse 67   Ht 5\' 5"  (1.651 m)   Wt 186 lb (84.4 kg)   LMP  (LMP Unknown)   BMI 30.95 kg/m  Overweight, well developed, in no acute distress  HEENT: Pupils are equal round react to light accommodation extraocular movements are intact.  Neck: no JVD No cervical lymphadenopathy. Cardiac: Regular rate and rhythm without murmurs rubs or gallops.  Lungs:  clear to auscultation bilaterally, no wheezing, rhonchi or rales  Abd: soft, nontender, positive bowel sounds all quadrants, no hepatosplenomegaly  Ext: no lower extremity edema.  2+ radial and dorsalis pedis pulses. Skin: warm and dry  Neuro:  Grossly normal, cranial nerves II through XII intact. Strength is 5 out of 5 equal upper and lower extremities.  EKG:   EKG ordered today 07/04/16-sinus rhythm 67 without any abnormalities. Sinus rhythm rate 71 bpm  ASSESSMENT AND PLAN:  Presyncope: Reassuring workup, life watch monitor reassuring, prior stress test reassuring, echocardiogram reviewed with her normal. Continue to monitor. Could certainly be neurocardiogenic.  Blood Pressures well-controlled.  Currently reassuring. No recent issues.  Hyperlipidemia.  Discussed low carb diet. She is intolerant to statins as she's had muscle aches. Continue to encourage diet and exercise.  Dyspnea on exertion  Prior stress test reassuring. If this becomes worse or more worrisome she will let us know. She noted this on the incline at [facility] while walking. Has not had much difficulty with this.  Leg pain   She is concerned about the possibility peripheral  vascular disease. Given her leg discomfort, we will go ahead and order lower extremity Doppler/ABI for further evaluation. Radial artery pulses are normal.   [name], MD

## 2016-07-04 NOTE — Patient Instructions (Signed)
Medication Instructions:  The current medical regimen is effective;  continue present plan and medications.  Testing/Procedures: Your physician has requested that you have a lower extremity arterial exercise duplex. During this test, exercise and ultrasound are used to evaluate arterial blood flow in the legs. Allow one hour for this exam. There are no restrictions or special instructions.  Follow-Up: Follow up as needed with Dr [name].  Thank you for choosing [facility]!!

## 2016-07-09 ENCOUNTER — Ambulatory Visit (HOSPITAL_COMMUNITY)
Admission: RE | Admit: 2016-07-09 | Discharge: 2016-07-09 | Disposition: A | Discharge status: Home / Self Care | Payer: 59 | Source: Ambulatory Visit | Attending: Cardiology | Admitting: Cardiology

## 2016-07-09 DIAGNOSIS — M79605 Pain in left leg: Secondary | ICD-10-CM | POA: Insufficient documentation

## 2016-07-09 DIAGNOSIS — E785 Hyperlipidemia, unspecified: Secondary | ICD-10-CM | POA: Insufficient documentation

## 2016-07-09 DIAGNOSIS — I10 Essential (primary) hypertension: Secondary | ICD-10-CM | POA: Insufficient documentation

## 2016-07-09 DIAGNOSIS — M79604 Pain in right leg: Secondary | ICD-10-CM | POA: Insufficient documentation

## 2016-10-08 ENCOUNTER — Other Ambulatory Visit: Payer: Self-pay | Admitting: Internal Medicine

## 2016-12-17 ENCOUNTER — Other Ambulatory Visit: Payer: Self-pay | Admitting: Internal Medicine

## 2017-01-07 ENCOUNTER — Other Ambulatory Visit: Payer: Self-pay | Admitting: Internal Medicine

## 2017-01-10 ENCOUNTER — Ambulatory Visit: Payer: 59 | Admitting: Internal Medicine

## 2017-01-17 ENCOUNTER — Encounter: Payer: Self-pay | Admitting: Internal Medicine

## 2017-01-17 ENCOUNTER — Telehealth: Payer: 59 | Admitting: Family

## 2017-01-17 DIAGNOSIS — J329 Chronic sinusitis, unspecified: Secondary | ICD-10-CM | POA: Diagnosis not present

## 2017-01-17 DIAGNOSIS — Z0000 Encounter for general adult medical examination without abnormal findings: Secondary | ICD-10-CM

## 2017-01-17 DIAGNOSIS — B9689 Other specified bacterial agents as the cause of diseases classified elsewhere: Secondary | ICD-10-CM | POA: Diagnosis not present

## 2017-01-17 DIAGNOSIS — E039 Hypothyroidism, unspecified: Secondary | ICD-10-CM

## 2017-01-17 MED ORDER — AMOXICILLIN-POT CLAVULANATE 875-125 MG PO TABS: 1.0000 | ORAL_TABLET | Freq: Two times a day (BID) | ORAL | 0 refills | Status: DC

## 2017-01-17 NOTE — Progress Notes (Signed)

## 2017-01-21 ENCOUNTER — Other Ambulatory Visit: Payer: Self-pay | Admitting: Emergency Medicine

## 2017-01-21 MED ORDER — LEVOTHYROXINE SODIUM 137 MCG PO TABS: 137.0000 ug | ORAL_TABLET | Freq: Every day | ORAL | 0 refills | Status: DC

## 2017-01-28 ENCOUNTER — Other Ambulatory Visit (INDEPENDENT_AMBULATORY_CARE_PROVIDER_SITE_OTHER): Payer: 59

## 2017-01-28 DIAGNOSIS — E039 Hypothyroidism, unspecified: Secondary | ICD-10-CM

## 2017-01-28 DIAGNOSIS — Z0000 Encounter for general adult medical examination without abnormal findings: Secondary | ICD-10-CM | POA: Diagnosis not present

## 2017-01-28 LAB — CBC WITH DIFFERENTIAL/PLATELET
Basophils Absolute: 0.1 10*3/uL (ref 0.0–0.1)
Basophils Relative: 0.9 % (ref 0.0–3.0)
EOS ABS: 0.2 10*3/uL (ref 0.0–0.7)
EOS PCT: 2.6 % (ref 0.0–5.0)
HCT: 42.1 % (ref 36.0–46.0)
HEMOGLOBIN: 14.6 g/dL (ref 12.0–15.0)
Lymphocytes Relative: 22.2 % (ref 12.0–46.0)
Lymphs Abs: 1.5 10*3/uL (ref 0.7–4.0)
MCHC: 34.6 g/dL (ref 30.0–36.0)
MCV: 84.2 fl (ref 78.0–100.0)
MONO ABS: 0.7 10*3/uL (ref 0.1–1.0)
Monocytes Relative: 10.4 % (ref 3.0–12.0)
Neutro Abs: 4.5 10*3/uL (ref 1.4–7.7)
Neutrophils Relative %: 63.9 % (ref 43.0–77.0)
Platelets: 238 10*3/uL (ref 150.0–400.0)
RBC: 4.99 Mil/uL (ref 3.87–5.11)
RDW: 13.4 % (ref 11.5–15.5)
WBC: 7 10*3/uL (ref 4.0–10.5)

## 2017-01-28 LAB — HEMOGLOBIN A1C: Hgb A1c MFr Bld: 5.7 % (ref 4.6–6.5)

## 2017-01-28 LAB — COMPREHENSIVE METABOLIC PANEL
ALBUMIN: 4.3 g/dL (ref 3.5–5.2)
ALK PHOS: 88 U/L (ref 39–117)
ALT: 18 U/L (ref 0–35)
AST: 17 U/L (ref 0–37)
BUN: 13 mg/dL (ref 6–23)
CO2: 27 mEq/L (ref 19–32)
CREATININE: 0.73 mg/dL (ref 0.40–1.20)
Calcium: 9.8 mg/dL (ref 8.4–10.5)
Chloride: 104 mEq/L (ref 96–112)
GFR: 85.49 mL/min (ref >60.00)
Glucose, Bld: 106 mg/dL — ABNORMAL HIGH (ref 70–99)
Potassium: 4.5 mEq/L (ref 3.5–5.1)
SODIUM: 139 meq/L (ref 135–145)
TOTAL PROTEIN: 7.4 g/dL (ref 6.0–8.3)
Total Bilirubin: 0.7 mg/dL (ref 0.2–1.2)

## 2017-01-28 LAB — LIPID PANEL
CHOL/HDL RATIO: 5
Cholesterol: 230 mg/dL — ABNORMAL HIGH (ref 0–200)
HDL: 47.5 mg/dL (ref >39.00)
LDL CALC: 161 mg/dL — AB (ref 0–99)
NonHDL: 182.89
TRIGLYCERIDES: 108 mg/dL (ref 0.0–149.0)
VLDL: 21.6 mg/dL (ref 0.0–40.0)

## 2017-01-28 LAB — TSH: TSH: 2.47 u[IU]/mL (ref 0.35–4.50)

## 2017-01-30 ENCOUNTER — Encounter: Payer: Self-pay | Admitting: Internal Medicine

## 2017-01-30 ENCOUNTER — Ambulatory Visit (INDEPENDENT_AMBULATORY_CARE_PROVIDER_SITE_OTHER): Payer: 59 | Admitting: Internal Medicine

## 2017-01-30 VITALS — BP 146/98 | HR 64 | Temp 97.9°F | Resp 16 | Wt 170.0 lb

## 2017-01-30 DIAGNOSIS — E7800 Pure hypercholesterolemia, unspecified: Secondary | ICD-10-CM | POA: Diagnosis not present

## 2017-01-30 DIAGNOSIS — K591 Functional diarrhea: Secondary | ICD-10-CM

## 2017-01-30 DIAGNOSIS — E039 Hypothyroidism, unspecified: Secondary | ICD-10-CM | POA: Diagnosis not present

## 2017-01-30 DIAGNOSIS — Z0000 Encounter for general adult medical examination without abnormal findings: Secondary | ICD-10-CM

## 2017-01-30 MED ORDER — LEVOTHYROXINE SODIUM 137 MCG PO TABS: 137.0000 ug | ORAL_TABLET | Freq: Every day | ORAL | 3 refills | Status: DC

## 2017-01-30 NOTE — Progress Notes (Signed)
Subjective:    Patient ID: Lindsey Ferrell, female    DOB: [birth year], 61 y.o.   MRN: [id number]  HPI She is here for a physical exam.   She does not feel great.  Her vision is blurry at times.  She feels off at times.  She has been taking a lot of sudafed.    Sinusitis:  She was started on Augmentin on 5/18.  She is exhausted and does not feel like she is getting better.    Medications and allergies reviewed with patient and updated if appropriate.  Patient Active Problem List   Diagnosis Date Noted  . Positive ANA (antinuclear antibody) 01/11/2016  . Vitamin D deficiency 01/08/2016  . Joint pain 01/08/2016  . AP (abdominal pain) 01/08/2016  . IBS (irritable bowel syndrome) 05/03/2015  . 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 Depressed mood 09/05/2014  . Diarrhea 02/23/2014  . Plantar fasciitis of left foot 01/31/2014  . GERD 05/15/2010  . [place] 06/09/2009  . THYROID NODULE, RIGHT 12/21/2008  . Hypothyroidism 12/21/2008  . SICKLE-CELL TRAIT 12/21/2008  . ALLERGIC RHINITIS 12/21/2008  . ASTHMA 12/21/2008    Current Outpatient Prescriptions on File Prior to Visit  Medication Sig Dispense Refill  . levothyroxine (SYNTHROID, LEVOTHROID) 137 MCG tablet Take 1 tablet (137 mcg total) by mouth daily. 15 tablet 0  . mometasone (NASONEX) 50 MCG/ACT nasal spray Place 2 sprays into the nose daily. -- Office visit needed for further refills 17 g 0   No current facility-administered medications on file prior to visit.     Past Medical History:  Diagnosis Date  . ALLERGIC RHINITIS, SEASONAL   . Allergy   . GERD   . HYPERLIPIDEMIA, BORDERLINE   . Kidney stone 07/2013   recurrent  . Melanoma ([place]) 2010   (R) knee area  . Sickle-cell trait ([place])   . THYROID NODULE, RIGHT   . Unspecified hypothyroidism     Past Surgical History:  Procedure Laterality Date  . CHOLECYSTECTOMY  04/2005  . COLONOSCOPY  08/05/2012   tics only  . CYSTOSCOPY WITH  RETROGRADE PYELOGRAM, URETEROSCOPY AND STENT PLACEMENT Left 07/06/2013   Procedure: CYSTOSCOPY WITH RETROGRADE PYELOGRAM, URETEROSCOPY AND STENT PLACEMENT;  Surgeon: Molli Hazard, MD;  Location: [facility];  Service: Urology;  Laterality: Left;  . CYSTOSCOPY WITH RETROGRADE PYELOGRAM, URETEROSCOPY AND STENT PLACEMENT Left 07/08/2013   Procedure: CYSTOSCOPY WITH LEFT  RETROGRADE PYELOGRAM  AND STENT PLACEMENT;  Surgeon: Ardis Hughs, MD;  Location: [facility];  Service: Urology;  Laterality: Left;  . CYSTOSCOPY WITH RETROGRADE PYELOGRAM, URETEROSCOPY AND STENT PLACEMENT Left 07/16/2013   Procedure: CYSTOSCOPY WITH RETROGRADE PYELOGRAM, URETEROSCOPY AND Stone removal, stent exchange;  Surgeon: Molli Hazard, MD;  Location: [facility];  Service: Urology;  Laterality: Left;  . ESOPHAGOGASTRODUODENOSCOPY  07/12/2010  . HOLMIUM LASER APPLICATION Left 34/10/8766   Procedure: HOLMIUM LASER APPLICATION;  Surgeon: Molli Hazard, MD;  Location: [facility];  Service: Urology;  Laterality: Left;  [place] Melanoma in situ excision  2010   (R) thigh   . NASAL SINUS SURGERY    . TONSILLECTOMY     childhood    Social History   Social History  . Marital status: Married    Spouse name: N/A  . Number of children: N/A  . Years of education: N/A   Social History Main Topics  . Smoking status: Never Smoker  . Smokeless tobacco: Never Used     Comment: Married, 2 grown dtr with  g-kids plus 2 adopted children, living at home  . Alcohol use Yes     Comment: Occassional  . Drug use: No  . Sexual activity: Not on file   Other Topics Concern  . Not on file   Social History Narrative  . No narrative on file    Family History  Problem Relation Age of Onset  . Arthritis Mother   . Diverticulosis Mother   . Heart disease Mother   . Arthritis Father   . Heart disease Father   . Breast cancer Sister   . Diabetes Maternal Uncle   . Hypertension Other        parent & grandparent  . Heart disease  Other        parent & grandparent  . Colon cancer Neg Hx   . Esophageal cancer Neg Hx   . Rectal cancer Neg Hx   . Stomach cancer Neg Hx     Review of Systems  Constitutional: Positive for fatigue (from infection). Negative for chills and fever.  HENT: Positive for ear pain (pressure), postnasal drip, sinus pain, sore throat and voice change. Negative for congestion.        Teeth aching  Eyes: Positive for visual disturbance (blurry intermittent).  Respiratory: Positive for cough, shortness of breath (intermittent) and wheezing.   Cardiovascular: Positive for palpitations (occasional - ? sudafed). Negative for chest pain and leg swelling.  Gastrointestinal: Positive for abdominal pain (prior to diarrhea) and diarrhea. Negative for blood in stool, constipation and nausea.       Occreflux   Genitourinary: Negative for dysuria and hematuria.  Musculoskeletal: Positive for back pain (discomfort in lower back). Negative for arthralgias.  Skin: Negative for color change and rash.  Neurological: Negative for light-headedness and headaches.  Psychiatric/Behavioral: Negative for dysphoric mood. The patient is nervous/anxious (sometimes).        Objective:   Vitals:   01/30/17 0848  BP: (!) 146/98  Pulse: 64  Resp: 16  Temp: 97.9 F (36.6 C)   Filed Weights   01/30/17 0848  Weight: 170 lb (77.1 kg)   Body mass index is 28.29 kg/m.  Wt Readings from Last 3 Encounters:  01/30/17 170 lb (77.1 kg)  07/04/16 186 lb (84.4 kg)  04/25/16 183 lb 3.2 oz (83.1 kg)    Has been taking sudafed - ? Causes of elevated BP   Physical Exam Constitutional: She appears well-developed and well-nourished. No distress.  HENT:  Head: Normocephalic and atraumatic.  Right Ear: External ear normal. Normal ear canal and TM Left Ear: External ear normal.  Normal ear canal and TM Mouth/Throat: Oropharynx is clear and moist.  Eyes: Conjunctivae and EOM are normal.  Neck: Neck supple. No tracheal  deviation present. No thyromegaly present.  No carotid bruit  Cardiovascular: Normal rate, regular rhythm and normal heart sounds.   No murmur heard.  No edema. Pulmonary/Chest: Effort normal and breath sounds normal. No respiratory distress. She has no wheezes. She has no rales.  Breast: deferred to Gyn Abdominal: Soft. She exhibits no distension. There is no tenderness.  Lymphadenopathy: She has no cervical adenopathy.  Skin: Skin is warm and dry. She is not diaphoretic.  Psychiatric: She has a normal mood and affect. Her behavior is normal.       Assessment & Plan:   Physical exam: Screening blood work  reviewed Immunizations   Up to date, discussed shingrxi Colonoscopy  Up to date  Mammogram  Done annually at gyn's office  Gyn  Scheduled - goes annually Eye exams - due, will schedule EKG  Done 07/2016 Exercise  None - will start exercising Weight - has lost weight Skin - no concerns, sees derm annually - Dr [place] Substance abuse  none  See Problem List for Assessment and Plan of chronic medical problems.

## 2017-01-30 NOTE — Assessment & Plan Note (Signed)
Cholesterol elevated Increase exercise Low fat/cholesterol diet

## 2017-01-30 NOTE — Assessment & Plan Note (Signed)
tsh in normal range Continue current dose 

## 2017-01-30 NOTE — Patient Instructions (Addendum)
We reviewed your blood work.   All other Health Maintenance issues reviewed.   All recommended immunizations and age-appropriate screenings are up-to-date or discussed.  No immunizations administered today.   Medications reviewed and updated.  No changes recommended at this time.  Your prescription(s) have been submitted to your pharmacy. Please take as directed and contact our office if you believe you are having problem(s) with the medication(s).   Please followup in one year for a physical   Health Maintenance, Female Adopting a healthy lifestyle and getting preventive care can go a long way to promote health and wellness. Talk with your health care provider about what schedule of regular examinations is right for you. This is a good chance for you to check in with your provider about disease prevention and staying healthy. In between checkups, there are plenty of things you can do on your own. Experts have done a lot of research about which lifestyle changes and preventive measures are most likely to keep you healthy. Ask your health care provider for more information. Weight and diet Eat a healthy diet  Be sure to include plenty of vegetables, fruits, low-fat dairy products, and lean protein.  Do not eat a lot of foods high in solid fats, added sugars, or salt.  Get regular exercise. This is one of the most important things you can do for your health. ? Most adults should exercise for at least 150 minutes each week. The exercise should increase your heart rate and make you sweat (moderate-intensity exercise). ? Most adults should also do strengthening exercises at least twice a week. This is in addition to the moderate-intensity exercise.  Maintain a healthy weight  Body mass index (BMI) is a measurement that can be used to identify possible weight problems. It estimates body fat based on height and weight. Your health care provider can help determine your BMI and help you  achieve or maintain a healthy weight.  For females 12 years of age and older: ? A BMI below 18.5 is considered underweight. ? A BMI of 18.5 to 24.9 is normal. ? A BMI of 25 to 29.9 is considered overweight. ? A BMI of 30 and above is considered obese.  Watch levels of cholesterol and blood lipids  You should start having your blood tested for lipids and cholesterol at 63 years of age, then have this test every 5 years.  You may need to have your cholesterol levels checked more often if: ? Your lipid or cholesterol levels are high. ? You are older than 63 years of age. ? You are at high risk for heart disease.  Cancer screening Lung Cancer  Lung cancer screening is recommended for adults 30-59 years old who are at high risk for lung cancer because of a history of smoking.  A yearly low-dose CT scan of the lungs is recommended for people who: ? Currently smoke. ? Have quit within the past 15 years. ? Have at least a 30-pack-year history of smoking. A pack year is smoking an average of one pack of cigarettes a day for 1 year.  Yearly screening should continue until it has been 15 years since you quit.  Yearly screening should stop if you develop a health problem that would prevent you from having lung cancer treatment.  Breast Cancer  Practice breast self-awareness. This means understanding how your breasts normally appear and feel.  It also means doing regular breast self-exams. Let your health care provider know about any changes, no  matter how small.  If you are in your 20s or 30s, you should have a clinical breast exam (CBE) by a health care provider every 1-3 years as part of a regular health exam.  If you are 2 or older, have a CBE every year. Also consider having a breast X-ray (mammogram) every year.  If you have a family history of breast cancer, talk to your health care provider about genetic screening.  If you are at high risk for breast cancer, talk to your health  care provider about having an MRI and a mammogram every year.  Breast cancer gene (BRCA) assessment is recommended for women who have family members with BRCA-related cancers. BRCA-related cancers include: ? Breast. ? Ovarian. ? Tubal. ? Peritoneal cancers.  Results of the assessment will determine the need for genetic counseling and BRCA1 and BRCA2 testing.  Cervical Cancer Your health care provider may recommend that you be screened regularly for cancer of the pelvic organs (ovaries, uterus, and vagina). This screening involves a pelvic examination, including checking for microscopic changes to the surface of your cervix (Pap test). You may be encouraged to have this screening done every 3 years, beginning at age 64.  For women ages 41-65, health care providers may recommend pelvic exams and Pap testing every 3 years, or they may recommend the Pap and pelvic exam, combined with testing for human papilloma virus (HPV), every 5 years. Some types of HPV increase your risk of cervical cancer. Testing for HPV may also be done on women of any age with unclear Pap test results.  Other health care providers may not recommend any screening for nonpregnant women who are considered low risk for pelvic cancer and who do not have symptoms. Ask your health care provider if a screening pelvic exam is right for you.  If you have had past treatment for cervical cancer or a condition that could lead to cancer, you need Pap tests and screening for cancer for at least 20 years after your treatment. If Pap tests have been discontinued, your risk factors (such as having a new sexual partner) need to be reassessed to determine if screening should resume. Some women have medical problems that increase the chance of getting cervical cancer. In these cases, your health care provider may recommend more frequent screening and Pap tests.  Colorectal Cancer  This type of cancer can be detected and often  prevented.  Routine colorectal cancer screening usually begins at 63 years of age and continues through 63 years of age.  Your health care provider may recommend screening at an earlier age if you have risk factors for colon cancer.  Your health care provider may also recommend using home test kits to check for hidden blood in the stool.  A small camera at the end of a tube can be used to examine your colon directly (sigmoidoscopy or colonoscopy). This is done to check for the earliest forms of colorectal cancer.  Routine screening usually begins at age 68.  Direct examination of the colon should be repeated every 5-10 years through 63 years of age. However, you may need to be screened more often if early forms of precancerous polyps or small growths are found.  Skin Cancer  Check your skin from head to toe regularly.  Tell your health care provider about any new moles or changes in moles, especially if there is a change in a mole's shape or color.  Also tell your health care provider if you have  a mole that is larger than the size of a pencil eraser.  Always use sunscreen. Apply sunscreen liberally and repeatedly throughout the day.  Protect yourself by wearing long sleeves, pants, a wide-brimmed hat, and sunglasses whenever you are outside.  Heart disease, diabetes, and high blood pressure  High blood pressure causes heart disease and increases the risk of stroke. High blood pressure is more likely to develop in: ? People who have blood pressure in the high end of the normal range (130-139/85-89 mm Hg). ? People who are overweight or obese. ? People who are African American.  If you are 6-48 years of age, have your blood pressure checked every 3-5 years. If you are 35 years of age or older, have your blood pressure checked every year. You should have your blood pressure measured twice-once when you are at a hospital or clinic, and once when you are not at a hospital or clinic.  Record the average of the two measurements. To check your blood pressure when you are not at a hospital or clinic, you can use: ? An automated blood pressure machine at a pharmacy. ? A home blood pressure monitor.  If you are between 32 years and 70 years old, ask your health care provider if you should take aspirin to prevent strokes.  Have regular diabetes screenings. This involves taking a blood sample to check your fasting blood sugar level. ? If you are at a normal weight and have a low risk for diabetes, have this test once every three years after 63 years of age. ? If you are overweight and have a high risk for diabetes, consider being tested at a younger age or more often. Preventing infection Hepatitis B  If you have a higher risk for hepatitis B, you should be screened for this virus. You are considered at high risk for hepatitis B if: ? You were born in a country where hepatitis B is common. Ask your health care provider which countries are considered high risk. ? Your parents were born in a high-risk country, and you have not been immunized against hepatitis B (hepatitis B vaccine). ? You have HIV or AIDS. ? You use needles to inject street drugs. ? You live with someone who has hepatitis B. ? You have had sex with someone who has hepatitis B. ? You get hemodialysis treatment. ? You take certain medicines for conditions, including cancer, organ transplantation, and autoimmune conditions.  Hepatitis C  Blood testing is recommended for: ? Everyone born from [birth year] through 1967. ? Anyone with known risk factors for hepatitis C.  Sexually transmitted infections (STIs)  You should be screened for sexually transmitted infections (STIs) including gonorrhea and chlamydia if: ? You are sexually active and are younger than 63 years of age. ? You are older than 63 years of age and your health care provider tells you that you are at risk for this type of infection. ? Your sexual  activity has changed since you were last screened and you are at an increased risk for chlamydia or gonorrhea. Ask your health care provider if you are at risk.  If you do not have HIV, but are at risk, it may be recommended that you take a prescription medicine daily to prevent HIV infection. This is called pre-exposure prophylaxis (PrEP). You are considered at risk if: ? You are sexually active and do not regularly use condoms or know the HIV status of your partner(s). ? You take drugs by injection. ?  You are sexually active with a partner who has HIV.  Talk with your health care provider about whether you are at high risk of being infected with HIV. If you choose to begin PrEP, you should first be tested for HIV. You should then be tested every 3 months for as long as you are taking PrEP. Pregnancy  If you are premenopausal and you may become pregnant, ask your health care provider about preconception counseling.  If you may become pregnant, take 400 to 800 micrograms (mcg) of folic acid every day.  If you want to prevent pregnancy, talk to your health care provider about birth control (contraception). Osteoporosis and menopause  Osteoporosis is a disease in which the bones lose minerals and strength with aging. This can result in serious bone fractures. Your risk for osteoporosis can be identified using a bone density scan.  If you are 65 years of age or older, or if you are at risk for osteoporosis and fractures, ask your health care provider if you should be screened.  Ask your health care provider whether you should take a calcium or vitamin D supplement to lower your risk for osteoporosis.  Menopause may have certain physical symptoms and risks.  Hormone replacement therapy may reduce some of these symptoms and risks. Talk to your health care provider about whether hormone replacement therapy is right for you. Follow these instructions at home:  Schedule regular health, dental,  and eye exams.  Stay current with your immunizations.  Do not use any tobacco products including cigarettes, chewing tobacco, or electronic cigarettes.  If you are pregnant, do not drink alcohol.  If you are breastfeeding, limit how much and how often you drink alcohol.  Limit alcohol intake to no more than 1 drink per day for nonpregnant women. One drink equals 12 ounces of beer, 5 ounces of wine, or 1 ounces of hard liquor.  Do not use street drugs.  Do not share needles.  Ask your health care provider for help if you need support or information about quitting drugs.  Tell your health care provider if you often feel depressed.  Tell your health care provider if you have ever been abused or do not feel safe at home. This information is not intended to replace advice given to you by your health care provider. Make sure you discuss any questions you have with your health care provider. Document Released: 03/04/2011 Document Revised: 01/25/2016 Document Reviewed: 05/23/2015 Elsevier Interactive Patient Education  2018 [employer]  

## 2017-01-30 NOTE — Assessment & Plan Note (Signed)
Related to IBS Will re-try lomotil and imodium Will f/u with GI

## 2017-02-07 ENCOUNTER — Other Ambulatory Visit: Payer: Self-pay | Admitting: Internal Medicine

## 2017-02-07 ENCOUNTER — Telehealth: Payer: Self-pay

## 2017-02-07 MED ORDER — LEVOTHYROXINE SODIUM 137 MCG PO TABS: 137.0000 ug | ORAL_TABLET | Freq: Every day | ORAL | 3 refills | Status: DC

## 2017-02-07 NOTE — Telephone Encounter (Signed)
Pt rq for levothyroxin to go to [facility] instead of [facility].   RX called into [facility] as requested.   [facility] has been deleted from pharmacy preferrence

## 2017-02-10 ENCOUNTER — Encounter: Payer: Self-pay | Admitting: Internal Medicine

## 2017-02-10 MED ORDER — DOXYCYCLINE HYCLATE 100 MG PO TABS: 100.0000 mg | ORAL_TABLET | Freq: Two times a day (BID) | ORAL | 0 refills | Status: DC

## 2017-02-11 ENCOUNTER — Ambulatory Visit: Payer: 59 | Admitting: General Practice

## 2017-02-11 VITALS — BP 140/84 | HR 63

## 2017-02-11 DIAGNOSIS — Z0130 Encounter for examination of blood pressure without abnormal findings: Secondary | ICD-10-CM

## 2017-02-15 ENCOUNTER — Other Ambulatory Visit: Payer: Self-pay | Admitting: Internal Medicine

## 2017-03-28 DIAGNOSIS — Z6828 Body mass index (BMI) 28.0-28.9, adult: Secondary | ICD-10-CM | POA: Diagnosis not present

## 2017-03-28 DIAGNOSIS — Z1231 Encounter for screening mammogram for malignant neoplasm of breast: Secondary | ICD-10-CM | POA: Diagnosis not present

## 2017-03-28 DIAGNOSIS — Z01419 Encounter for gynecological examination (general) (routine) without abnormal findings: Secondary | ICD-10-CM | POA: Diagnosis not present

## 2017-04-08 ENCOUNTER — Encounter: Payer: Self-pay | Admitting: Internal Medicine

## 2017-04-12 MED ORDER — DOXYCYCLINE HYCLATE 100 MG PO TABS: 100.0000 mg | ORAL_TABLET | Freq: Two times a day (BID) | ORAL | 0 refills | Status: DC

## 2017-04-21 DIAGNOSIS — M25572 Pain in left ankle and joints of left foot: Secondary | ICD-10-CM | POA: Diagnosis not present

## 2017-05-19 ENCOUNTER — Ambulatory Visit: Payer: 59 | Admitting: Physician Assistant

## 2017-06-02 ENCOUNTER — Telehealth: Payer: Self-pay | Admitting: Cardiology

## 2017-06-02 ENCOUNTER — Ambulatory Visit (INDEPENDENT_AMBULATORY_CARE_PROVIDER_SITE_OTHER): Payer: 59 | Admitting: Physician Assistant

## 2017-06-02 ENCOUNTER — Other Ambulatory Visit: Payer: Self-pay | Admitting: *Deleted

## 2017-06-02 ENCOUNTER — Other Ambulatory Visit (INDEPENDENT_AMBULATORY_CARE_PROVIDER_SITE_OTHER): Payer: 59

## 2017-06-02 ENCOUNTER — Encounter: Payer: Self-pay | Admitting: Physician Assistant

## 2017-06-02 VITALS — BP 138/92 | HR 69 | Ht 65.0 in | Wt 170.0 lb

## 2017-06-02 DIAGNOSIS — R197 Diarrhea, unspecified: Secondary | ICD-10-CM | POA: Diagnosis not present

## 2017-06-02 DIAGNOSIS — L659 Nonscarring hair loss, unspecified: Secondary | ICD-10-CM | POA: Diagnosis not present

## 2017-06-02 DIAGNOSIS — R1030 Lower abdominal pain, unspecified: Secondary | ICD-10-CM

## 2017-06-02 DIAGNOSIS — R0789 Other chest pain: Secondary | ICD-10-CM

## 2017-06-02 DIAGNOSIS — R634 Abnormal weight loss: Secondary | ICD-10-CM

## 2017-06-02 DIAGNOSIS — K529 Noninfective gastroenteritis and colitis, unspecified: Secondary | ICD-10-CM

## 2017-06-02 LAB — SEDIMENTATION RATE: Sed Rate: 17 mm/hr (ref 0–30)

## 2017-06-02 LAB — C-REACTIVE PROTEIN: CRP: 0.2 mg/dL — AB (ref 0.5–20.0)

## 2017-06-02 MED ORDER — NA SULFATE-K SULFATE-MG SULF 17.5-3.13-1.6 GM/177ML PO SOLN: 1.0000 | Freq: Once | ORAL | 0 refills | Status: AC

## 2017-06-02 MED ORDER — GLYCOPYRROLATE 2 MG PO TABS: 2.0000 mg | ORAL_TABLET | Freq: Two times a day (BID) | ORAL | 6 refills | Status: DC

## 2017-06-02 MED FILL — SUPREP BOWEL PREP KIT: 17.5-3.13-1 | 1 days supply | Qty: 354 | Fill #0

## 2017-06-02 MED FILL — GLYCOPYRROLATE 2 MG TABLET: 2 | 30 days supply | Qty: 60 | Fill #0

## 2017-06-02 NOTE — Progress Notes (Signed)
Subjective:    Patient ID: Lindsey Ferrell, female    DOB: 1955-10-04, 61 y.o.   MRN: [id number]  HPI Lindsey Ferrell is a pleasant 61 year old white female, known to Dr. Adela Ferrell who was last seen in our office in August 2017. She has history of chronic diarrhea which she says been present over the past 6 years at this point. She comes in today because she feels her symptoms have worsened and are significantly affecting her life. She says she can't go out anywhere without knowing were all the bathrooms are, and at times has actually had to run home when she is out walking her dog because of urgency for bowel movements and has had incontinence episodes. She's suffering from severe urgency postprandially which seems to be worse in the morning and at lunch time and not quite as bad usually in the evenings. She's averaging at least 6-8 diarrheal stools per day and has not had any formed stools for years. Generally not having nocturnal episodes of diarrhea. She has not had any melena or hematochezia. She's unable to take much Imodium because it does help with the diarrhea but increases abdominal pain. She's been having lower abdominal discomfort off and on and some cramping worse over the past year. She's also had a 20 pound weight loss over the past year which is been on intentional, and feels that she has had significant hair loss over the past year. She also has complaints of chronic fatigue. She did have an ANA checked a year or so ago which was positive at 1-640. She saw a rheumatologist Dr. Zenovia Ferrell once and says she was told that she did not have rheumatoid arthritis but was not sure what else had been checked. Colonoscopy was done in December 2015 as was upper endoscopy. She had small bowel biopsies which were negative for celiac disease and random biopsies of the colon no evidence of microscopic colitis. Previous trial of Questran not helpful, she did try Viberzi, which slowed her bowels somewhat  but did not eradicate her symptoms. She has not had a course of Xifaxan. Fecal elastase was within normal limits 1 year ago and TTG within normal limits She says her daughter has POTS, and patient feels certain that she has some sort of underlying autoimmune process.  Review of Systems Pertinent positive and negative review of systems were noted in the above HPI section.  All other review of systems was otherwise negative.  Outpatient Encounter Prescriptions as of 06/02/2017  Medication Sig  . doxycycline (VIBRA-TABS) 100 MG tablet Take 1 tablet (100 mg total) by mouth 2 (two) times daily.  Marland Kitchen levothyroxine (SYNTHROID, LEVOTHROID) 137 MCG tablet Take 1 tablet (137 mcg total) by mouth daily.  . mometasone (NASONEX) 50 MCG/ACT nasal spray PLACE 2 SPRAYS INTO THE NOSTRILS ONCE DAILY  . glycopyrrolate (ROBINUL) 2 MG tablet Take 1 tablet (2 mg total) by mouth 2 (two) times daily. For urgency, diarrhea  . Na Sulfate-K Sulfate-Mg Sulf 17.5-3.13-1.6 GM/180ML SOLN Take 1 kit by mouth once.   No facility-administered encounter medications on file as of 06/02/2017.    Allergies  Allergen Reactions  . Statins     myalgias  . Droperidol     In combination with Fentanyl as Innovar causing Facial drawing   Patient Active Problem List   Diagnosis Date Noted  . Positive ANA (antinuclear antibody) 01/11/2016  . Vitamin D deficiency 01/08/2016  . AP (abdominal pain) 01/08/2016  . IBS (irritable bowel syndrome) 05/03/2015  .  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 Depressed mood 09/05/2014  . Diarrhea 02/23/2014  . Plantar fasciitis of left foot 01/31/2014  . GERD 05/15/2010  . Hyperlipidemia 06/09/2009  . THYROID NODULE, RIGHT 12/21/2008  . Hypothyroidism 12/21/2008  . SICKLE-CELL TRAIT 12/21/2008  . ALLERGIC RHINITIS 12/21/2008  . ASTHMA 12/21/2008   Social History   Social History  . Marital status: Married    Spouse name: N/A  . Number of children: N/A  . Years of education: N/A    Occupational History  . Not on file.   Social History Main Topics  . Smoking status: Never Smoker  . Smokeless tobacco: Never Used     Comment: Married, 2 grown dtr with g-kids plus 2 adopted children, living at home  . Alcohol use Yes     Comment: Occassional  . Drug use: No  . Sexual activity: Not on file   Other Topics Concern  . Not on file   Social History Narrative  . No narrative on file    Ms. Lindsey Ferrell's family history includes Arthritis in her father and mother; Breast cancer in her sister; Diabetes in her maternal uncle; Diverticulosis in her mother; Heart disease in her father, mother, and other; Hypertension in her other; Lung cancer in her brother.      Objective:    Vitals:   06/02/17 0958  BP: (!) 138/92  Pulse: 69    Physical Exam well-developed older white female in no acute distress, pleasant blood pressure 138/92 pulse 69, height 5 foot 5, weight 170, BMI 28.2. HEENT; nontraumatic normocephalic EOMI PERRLA sclera anicteric, Cardiovascular; regular rate and rhythm with S1-S2 no murmur or gallop, Pulmonary ;clear bilaterally, Abdomen; soft, bowel sounds are present she has some mild bilateral lower quadrant tenderness no guarding or rebound no palpable mass or hepatosplenomegaly, Rectal ;exam not done, Extremities ;no clubbing cyanosis or edema skin warm and dry, Neuropsych ;mood and affect appropriate       Assessment & Plan:   #37 61 year old white female with chronic diarrhea 6 years. Patient says symptoms started acutely with a gastroenteritis type illness with nausea vomiting and diarrhea and have persisted ever since. She has had worsening over the past year, now associated with 20 pound weight loss, gradual hair loss and new lower abdominal pain. Previous workup has been negative for celiac disease, microscopic colitis, she had no response to previous trial of Questran, previous fecal elastase within normal limits. 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 Adela Ferrell and biopsies. Procedure discussed in detail with patient the risks and benefits and she is agreeable to proceed Sedimentation rate, CRP, If above all negative would also give her a course of Xifaxan 550 by mouth 3 times a day 14 days. Have requested records from previous rheumatology evaluation per Dr. Lendon Colonel. Patient would like a referral to another rheumatologist, and this was requested today.  Jayma Volpi S Brelyn Woehl PA-C 06/02/2017   Cc: Pincus Sanes, MD

## 2017-06-02 NOTE — Patient Instructions (Addendum)
Please go to the basement level to have your labs drawn.  We have sent the following medications to your pharmacy for you to pick up at your convenience: [facility].  1. Robinul Forte 2. Suprep for the colonoscopy  You will be called with a referral  Appointment to Rheumatology.   You have been scheduled for a colonoscopy. Please follow written instructions given to you at your visit today.  Please pick up your prep supplies at the pharmacy within the next 1-3 days. [facility]  If you use inhalers (even only as needed), please bring them with you on the day of your procedure. Your physician has requested that you go to www.startemmi.com and enter the access code given to you at your visit today. This web site gives a general overview about your procedure. However, you should still follow specific instructions given to you by our office regarding your preparation for the procedure.

## 2017-06-02 NOTE — Telephone Encounter (Signed)
Spoke with patient who reports when she got up Saturday AM she felt fine.  No problems.  As the day wore on she noticed a pain in her neck that would move into both shoulders.  Be became worse throughout the day and she took Motrin for it.  That evening they had been invited to the neighbors for dinner.  She reports having a few hors d'oeuvres when she developed a cold clammy feeling.  She told her husband she was going back across the street .  Once there she reports having a feeling as if she had a huge tourniquet around her chest with large men on either side of her pulling.  She at first lay down and then got up and took ASA.  There was no real pain just a lot of pressure.  It lasted about 30 mins.  Her husband came home to check on her, asked her what happened and told her she looked bad.  She denies any n/v, SOB or pain/pressure anywhere else.  The pressure was relieved with rest and she has not had anymore.  Today she has been "fine" active, walking the dog etc.  She saw her GI MD today for eval of persistent diarrhea and she had been ordered to have a colonoscopy.  She reports her BP has been elevated a few times recently as well.  Today it was 138/92 at the MD's office.  Advised I will review this information with Dr [name] and f/u with her tomorrow as she has not been having any other s/s today or at present.  Pt is in agreement.

## 2017-06-02 NOTE — Telephone Encounter (Signed)
°  New Prob  Pt c/o of Chest Pain: STAT if CP now or developed within 24 hours  1. Are you having CP right now? No  2. Are you experiencing any other symptoms (ex. SOB, nausea, vomiting, sweating)? Sweating, cool clammy skin  3. How long have you been experiencing CP? Approximately 20 minutes  4. Is your CP continuous or coming and going? Was continuous   5. Have you taken Nitroglycerin? No ?  Pt states she believes she had a cardiac event on Saturday 9/28. States she experienced an episode of pain in her neck that radiated to shoulder Saturday morning. She then reports cold, clammy feeling Saturday afternoon while at a dinner party. While walking back to her home from a neighbors house, she experienced significant pressure to her chest which lasted for approximately 20 minutes. Requesting to speak to a nurse regarding this event.

## 2017-06-03 ENCOUNTER — Encounter: Payer: Self-pay | Admitting: Gastroenterology

## 2017-06-03 NOTE — Telephone Encounter (Signed)
Order a stress echocardiogram-diagnosis atypical chest pain. Thanks  [name], MD

## 2017-06-03 NOTE — Progress Notes (Signed)
Agree with assessment and plan as outlined.  

## 2017-06-03 NOTE — Telephone Encounter (Signed)
Pt aware of testing ordered.  Reviewed all instructions with her.  She denied any questions at this time.  She is aware someone from this office will be contacting her to schedule the appt.  She is also aware that if this chest pressure reoccurs prior to her evaluation she should call 911 and be taken to the closest ED to further evaluation.  She will c/b with any questions or concerns.

## 2017-06-11 ENCOUNTER — Encounter: Payer: 59 | Admitting: Gastroenterology

## 2017-06-11 DIAGNOSIS — H5203 Hypermetropia, bilateral: Secondary | ICD-10-CM | POA: Diagnosis not present

## 2017-06-11 DIAGNOSIS — H52203 Unspecified astigmatism, bilateral: Secondary | ICD-10-CM | POA: Diagnosis not present

## 2017-06-11 DIAGNOSIS — H524 Presbyopia: Secondary | ICD-10-CM | POA: Diagnosis not present

## 2017-06-17 ENCOUNTER — Ambulatory Visit (AMBULATORY_SURGERY_CENTER): Payer: 59 | Admitting: Gastroenterology

## 2017-06-17 ENCOUNTER — Encounter: Payer: Self-pay | Admitting: Gastroenterology

## 2017-06-17 VITALS — BP 126/67 | HR 60 | Temp 97.5°F | Resp 13 | Ht 65.0 in | Wt 170.0 lb

## 2017-06-17 DIAGNOSIS — R197 Diarrhea, unspecified: Secondary | ICD-10-CM | POA: Diagnosis present

## 2017-06-17 DIAGNOSIS — K5730 Diverticulosis of large intestine without perforation or abscess without bleeding: Secondary | ICD-10-CM | POA: Diagnosis not present

## 2017-06-17 DIAGNOSIS — K529 Noninfective gastroenteritis and colitis, unspecified: Secondary | ICD-10-CM | POA: Diagnosis not present

## 2017-06-17 MED ORDER — SODIUM CHLORIDE 0.9 % IV SOLN: 500.0000 mL | INTRAVENOUS | Status: DC

## 2017-06-17 NOTE — Patient Instructions (Signed)
YOU HAD AN ENDOSCOPIC PROCEDURE TODAY AT THE [facility]:   Refer to the procedure report that was given to you for any specific questions about what was found during the examination.  If the procedure report does not answer your questions, please call your gastroenterologist to clarify.  If you requested that your care partner not be given the details of your procedure findings, then the procedure report has been included in a sealed envelope for you to review at your convenience later.  YOU SHOULD EXPECT: Some feelings of bloating in the abdomen. Passage of more gas than usual.  Walking can help get rid of the air that was put into your GI tract during the procedure and reduce the bloating. If you had a lower endoscopy (such as a colonoscopy or flexible sigmoidoscopy) you may notice spotting of blood in your stool or on the toilet paper. If you underwent a bowel prep for your procedure, you may not have a normal bowel movement for a few days.  Please Note:  You might notice some irritation and congestion in your nose or some drainage.  This is from the oxygen used during your procedure.  There is no need for concern and it should clear up in a day or so.  SYMPTOMS TO REPORT IMMEDIATELY:   Following lower endoscopy (colonoscopy or flexible sigmoidoscopy):  Excessive amounts of blood in the stool  Significant tenderness or worsening of abdominal pains  Swelling of the abdomen that is new, acute  Fever of 100F or higher   For urgent or emergent issues, a gastroenterologist can be reached at any hour by calling [phone].   DIET:  We do recommend a small meal at first, but then you may proceed to your regular diet.  Drink plenty of fluids but you should avoid alcoholic beverages for 24 hours.  ACTIVITY:  You should plan to take it easy for the rest of today and you should NOT DRIVE or use heavy machinery until tomorrow (because of the sedation medicines used during the test).     FOLLOW UP: Our staff will call the number listed on your records the next business day following your procedure to check on you and address any questions or concerns that you may have regarding the information given to you following your procedure. If we do not reach you, we will leave a message.  However, if you are feeling well and you are not experiencing any problems, there is no need to return our call.  We will assume that you have returned to your regular daily activities without incident.  If any biopsies were taken you will be contacted by phone or by letter within the next 1-3 weeks.  Please call us at [phone] if you have not heard about the biopsies in 3 weeks.    SIGNATURES/CONFIDENTIALITY: You and/or your care partner have signed paperwork which will be entered into your electronic medical record.  These signatures attest to the fact that that the information above on your After Visit Summary has been reviewed and is understood.  Full responsibility of the confidentiality of this discharge information lies with you and/or your care-partner.   Resume medications. Information given on hemorrhoids and diverticulosis. 

## 2017-06-17 NOTE — Progress Notes (Signed)
Spontaneous respirations throughout. VSS. Resting comfortably. To PACU on room air. Report to  RN. 

## 2017-06-17 NOTE — Op Note (Signed)
[facility] Patient Name: Lindsey Ferrell Procedure Date: 06/17/2017 11:04 AM MRN: [id number] Endoscopist: Remo Lipps P. Armbruster MD, MD Age: 61 Referring MD:  Date of Birth: 1955-11-22 Gender: Female Account #: [id number] Procedure:                Colonoscopy Indications:              worsening of chronic diarrhea Medicines:                Monitored Anesthesia Care Procedure:                Pre-Anesthesia Assessment:                           - Prior to the procedure, a History and Physical                            was performed, and patient medications and                            allergies were reviewed. The patient's tolerance of                            previous anesthesia was also reviewed. The risks                            and benefits of the procedure and the sedation                            options and risks were discussed with the patient.                            All questions were answered, and informed consent                            was obtained. Prior Anticoagulants: The patient has                            taken no previous anticoagulant or antiplatelet                            agents. ASA Grade Assessment: II - A patient with                            mild systemic disease. After reviewing the risks                            and benefits, the patient was deemed in                            satisfactory condition to undergo the procedure.                           After obtaining informed consent, the colonoscope  was passed under direct vision. Throughout the                            procedure, the patient's blood pressure, pulse, and                            oxygen saturations were monitored continuously. The                            Colonoscope was introduced through the anus and                            advanced to the the terminal ileum, with                            identification of the  appendiceal orifice and IC                            valve. The colonoscopy was performed without                            difficulty. The patient tolerated the procedure                            well. The quality of the bowel preparation was                            good. The terminal ileum, ileocecal valve,                            appendiceal orifice, and rectum were photographed. Scope In: 11:07:58 AM Scope Out: 11:25:42 AM Scope Withdrawal Time: 0 hours 15 minutes 19 seconds  Total Procedure Duration: 0 hours 17 minutes 44 seconds  Findings:                 The perianal and digital rectal examinations were                            normal.                           The terminal ileum appeared normal.                           A few medium-mouthed diverticula were found in the                            sigmoid colon.                           Internal hemorrhoids were found during                            retroflexion. The hemorrhoids were small.  The exam was otherwise without abnormality.                           Biopsies for histology were taken with a cold                            forceps from the right colon, left colon and                            transverse colon for evaluation of microscopic                            colitis. Complications:            No immediate complications. Estimated blood loss:                            Minimal. Estimated Blood Loss:     Estimated blood loss was minimal. Impression:               - The examined portion of the ileum was normal.                           - Diverticulosis in the sigmoid colon.                           - Internal hemorrhoids.                           - The examination was otherwise normal. No                            inflammatory changes noted.                           - Biopsies were taken with a cold forceps from the                            right colon, left colon and  transverse colon for                            evaluation of microscopic colitis. Recommendation:           - Patient has a contact number available for                            emergencies. The signs and symptoms of potential                            delayed complications were discussed with the                            patient. Return to normal activities tomorrow.                            Written discharge instructions were provided to the  patient.                           - Resume previous diet.                           - Continue present medications.                           - Await pathology results.                           - If pathology results negative, may consider trial                            of Rifaximin initially, and then consideration for                            Alosetron if symptoms persist given severity of                            symptoms and failure of prior treatment options                           - Repeat colonoscopy in 10 years for screening                            purposes. Remo Lipps P. Armbruster MD, MD 06/17/2017 11:32:50 AM This report has been signed electronically.

## 2017-06-17 NOTE — Progress Notes (Signed)
Called to room to assist during endoscopic procedure.  Patient ID and intended procedure confirmed with present staff. Received instructions for my participation in the procedure from the performing physician.  

## 2017-06-18 ENCOUNTER — Telehealth: Payer: Self-pay | Admitting: *Deleted

## 2017-06-18 NOTE — Telephone Encounter (Signed)
  Follow up Call-  Call back number 06/17/2017  Post procedure Call Back phone  # [phone] cell  Permission to leave phone message Yes  Some recent data might be hidden     Patient questions:  Do you have a fever, pain , or abdominal swelling? No. Pain Score  0 *  Have you tolerated food without any problems? Yes.    Have you been able to return to your normal activities? Yes.    Do you have any questions about your discharge instructions: Diet   No. Medications  No. Follow up visit  No.  Do you have questions or concerns about your Care? No.  Actions: * If pain score is 4 or above: No action needed, pain <4.

## 2017-06-18 NOTE — Telephone Encounter (Signed)
  Follow up Call-  Call back number 06/17/2017  Post procedure Call Back phone  # [phone] cell  Permission to leave phone message Yes  Some recent data might be hidden     No answer, left message

## 2017-06-20 ENCOUNTER — Other Ambulatory Visit: Payer: Self-pay

## 2017-06-20 MED ORDER — BUDESONIDE 3 MG PO CPEP: 9.0000 mg | ORAL_CAPSULE | ORAL | 0 refills | Status: DC

## 2017-06-20 MED FILL — BUDESONIDE EC 3 MG CAPSULE: 3 | 70 days supply | Qty: 140 | Fill #0

## 2017-06-26 ENCOUNTER — Telehealth (HOSPITAL_COMMUNITY): Payer: Self-pay | Admitting: *Deleted

## 2017-06-26 NOTE — Telephone Encounter (Signed)
Patient given detailed instructions per Stress Test Requisition Sheet for test on 07/01/17 at 7:30.Patient Notified to arrive 30 minutes early, and that it is imperative to arrive on time for appointment to keep from having the test rescheduled.  Patient verbalized understanding. Veronia Beets

## 2017-07-01 ENCOUNTER — Other Ambulatory Visit (HOSPITAL_COMMUNITY): Payer: 59

## 2017-08-19 DIAGNOSIS — L720 Epidermal cyst: Secondary | ICD-10-CM | POA: Diagnosis not present

## 2017-08-19 DIAGNOSIS — L738 Other specified follicular disorders: Secondary | ICD-10-CM | POA: Diagnosis not present

## 2017-08-19 DIAGNOSIS — L650 Telogen effluvium: Secondary | ICD-10-CM | POA: Diagnosis not present

## 2017-08-19 DIAGNOSIS — D2261 Melanocytic nevi of right upper limb, including shoulder: Secondary | ICD-10-CM | POA: Diagnosis not present

## 2017-08-19 DIAGNOSIS — D225 Melanocytic nevi of trunk: Secondary | ICD-10-CM | POA: Diagnosis not present

## 2017-08-19 DIAGNOSIS — D1801 Hemangioma of skin and subcutaneous tissue: Secondary | ICD-10-CM | POA: Diagnosis not present

## 2017-08-19 DIAGNOSIS — L821 Other seborrheic keratosis: Secondary | ICD-10-CM | POA: Diagnosis not present

## 2017-08-19 DIAGNOSIS — D2262 Melanocytic nevi of left upper limb, including shoulder: Secondary | ICD-10-CM | POA: Diagnosis not present

## 2017-08-22 ENCOUNTER — Ambulatory Visit (INDEPENDENT_AMBULATORY_CARE_PROVIDER_SITE_OTHER): Payer: 59 | Admitting: Internal Medicine

## 2017-08-22 ENCOUNTER — Encounter: Payer: Self-pay | Admitting: Internal Medicine

## 2017-08-22 VITALS — BP 114/76 | HR 84 | Temp 97.9°F | Resp 16 | Wt 169.0 lb

## 2017-08-22 DIAGNOSIS — F419 Anxiety disorder, unspecified: Secondary | ICD-10-CM | POA: Diagnosis not present

## 2017-08-22 MED ORDER — CLONAZEPAM 0.5 MG PO TABS: 0.2500 mg | ORAL_TABLET | Freq: Two times a day (BID) | ORAL | 1 refills | Status: DC | PRN

## 2017-08-22 NOTE — Patient Instructions (Signed)
Start the clonazepam twice daily --  Take 1/2 or 1 pill twice daily as needed.   Call with side effects or questions.

## 2017-08-22 NOTE — Progress Notes (Signed)
Subjective:    Patient ID: Lindsey Ferrell, female    DOB: 1956/01/08, 61 y.o.   MRN: [id number]  HPI She is here for an acute visit.   She is here because of increased stress and anxiety to the point that she feels she needs medication.  She does not feel depressed.  She is getting a feeling of knots in her chest and abdomen and is having difficulty sleeping.  She does feel panicky at times.  She has episodes of chest pain and shortness breath but is related to her anxiety.  She denies any palpitations.  She denies changes in her appetite.  Her anxiety is related to her relationship with her daughter.  She just got home from her first semester at college and did not do well.  She is being combative towards her and her behavior is outrageous and uncontrollable.  Her daughter is seeing the therapist and was just placed on medication.  She is also seeing the same therapist, but she feels she needs medication to help her through this.  She has never been on medication before in the past.  She does not want to be on anything that may cause weight gain because she is struggled so long to lose weight.   Medications and allergies reviewed with patient and updated if appropriate.  Patient Active Problem List   Diagnosis Date Noted  . Positive ANA (antinuclear antibody) 01/11/2016  . Vitamin D deficiency 01/08/2016  . AP (abdominal pain) 01/08/2016  . IBS (irritable bowel syndrome) 05/03/2015  . 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 Diarrhea 02/23/2014  . Plantar fasciitis of left foot 01/31/2014  . GERD 05/15/2010  . Hyperlipidemia 06/09/2009  . THYROID NODULE, RIGHT 12/21/2008  . Hypothyroidism 12/21/2008  . SICKLE-CELL TRAIT 12/21/2008  . ALLERGIC RHINITIS 12/21/2008  . ASTHMA 12/21/2008    Current Outpatient Medications on File Prior to Visit  Medication Sig Dispense Refill  . budesonide (ENTOCORT EC) 3 MG 24 hr capsule Take 3 capsules (9 mg total) by mouth as directed. Take  3 capsules daily for 4 weeks, taper to 2 caps daily for 2 weeks then 1 cap daily for 2 weeks until gone. 140 capsule 0  . levothyroxine (SYNTHROID, LEVOTHROID) 137 MCG tablet Take 1 tablet (137 mcg total) by mouth daily. 90 tablet 3  . mometasone (NASONEX) 50 MCG/ACT nasal spray PLACE 2 SPRAYS INTO THE NOSTRILS ONCE DAILY (Patient not taking: Reported on 08/22/2017) 17 g 2   No current facility-administered medications on file prior to visit.     Past Medical History:  Diagnosis Date  . ALLERGIC RHINITIS, SEASONAL   . Allergy   . Blood transfusion without reported diagnosis    Eptopic prgnancy   . GERD   . HYPERLIPIDEMIA, BORDERLINE   . Kidney stone 07/2013   recurrent  . Melanoma ([place]) 2010   (R) knee area  . Sickle cell anemia ([facility])    pt reports sickle cell trait  . Sickle-cell trait ([place])   . THYROID NODULE, RIGHT   . Unspecified hypothyroidism     Past Surgical History:  Procedure Laterality Date  . CHOLECYSTECTOMY  04/2005  . COLONOSCOPY  08/05/2012   tics only  . CYSTOSCOPY WITH RETROGRADE PYELOGRAM, URETEROSCOPY AND STENT PLACEMENT Left 07/06/2013   Procedure: CYSTOSCOPY WITH RETROGRADE PYELOGRAM, URETEROSCOPY AND STENT PLACEMENT;  Surgeon: Molli Hazard, MD;  Location: [facility];  Service: Urology;  Laterality: Left;  . CYSTOSCOPY WITH RETROGRADE PYELOGRAM, URETEROSCOPY AND STENT PLACEMENT  Left 07/08/2013   Procedure: CYSTOSCOPY WITH LEFT  RETROGRADE PYELOGRAM  AND STENT PLACEMENT;  Surgeon: Ardis Hughs, MD;  Location: [facility];  Service: Urology;  Laterality: Left;  . CYSTOSCOPY WITH RETROGRADE PYELOGRAM, URETEROSCOPY AND STENT PLACEMENT Left 07/16/2013   Procedure: CYSTOSCOPY WITH RETROGRADE PYELOGRAM, URETEROSCOPY AND Stone removal, stent exchange;  Surgeon: Molli Hazard, MD;  Location: [facility];  Service: Urology;  Laterality: Left;  . DILATION AND CURETTAGE OF UTERUS    . Eptopic pregnancy    . ESOPHAGOGASTRODUODENOSCOPY  07/12/2010  . HOLMIUM LASER  APPLICATION Left 22/10/9796   Procedure: HOLMIUM LASER APPLICATION;  Surgeon: Molli Hazard, MD;  Location: [facility];  Service: Urology;  Laterality: Left;  Marland Kitchen Melanoma in situ excision  2010   (R) thigh   . NASAL SINUS SURGERY    . TONSILLECTOMY     childhood  . UPPER GASTROINTESTINAL ENDOSCOPY      Social History   Socioeconomic History  . Marital status: Married    Spouse name: None  . Number of children: None  . Years of education: None  . Highest education level: None  Social Needs  . Financial resource strain: None  . Food insecurity - worry: None  . Food insecurity - inability: None  . Transportation needs - medical: None  . Transportation needs - non-medical: None  Occupational History  . None  Tobacco Use  . Smoking status: Never Smoker  . Smokeless tobacco: Never Used  . Tobacco comment: Married, 2 grown dtr with g-kids plus 2 adopted children, living at home  Substance and Sexual Activity  . Alcohol use: Yes    Comment: Occassional  . Drug use: No  . Sexual activity: None  Other Topics Concern  . None  Social History Narrative  . None    Family History  Problem Relation Age of Onset  . Arthritis Mother   . Diverticulosis Mother   . Heart disease Mother   . Arthritis Father   . Heart disease Father   . Breast cancer Sister   . Diabetes Maternal Uncle   . Hypertension Other        parent & grandparent  . Heart disease Other        parent & grandparent  . Lung cancer Brother        smoker  . Colon cancer Neg Hx   . Esophageal cancer Neg Hx   . Rectal cancer Neg Hx   . Stomach cancer Neg Hx   . Pancreatic cancer Neg Hx   . Prostate cancer Neg Hx     Review of Systems  Constitutional: Negative for appetite change.  Respiratory: Positive for shortness of breath (anxiety related only). Negative for cough and wheezing.   Cardiovascular: Positive for chest pain (with anxiety). Negative for palpitations.  Neurological: Negative for  light-headedness and headaches.  Psychiatric/Behavioral: Positive for sleep disturbance. Negative for decreased concentration. The patient is nervous/anxious.        Objective:   Vitals:   08/22/17 1434  BP: 114/76  Pulse: 84  Resp: 16  Temp: 97.9 F (36.6 C)  SpO2: 97%   Wt Readings from Last 3 Encounters:  08/22/17 169 lb (76.7 kg)  06/17/17 170 lb (77.1 kg)  06/02/17 170 lb (77.1 kg)   Body mass index is 28.12 kg/m.   Physical Exam  Constitutional: She is oriented to person, place, and time. She appears well-developed and well-nourished. No distress.  HENT:  Head: Normocephalic and atraumatic.  Neurological: She is alert and oriented to person, place, and time.  Skin: She is not diaphoretic.  Psychiatric: Her behavior is normal. Judgment and thought content normal.  Anxious mood and affect.  Crying intermittently.         Assessment & Plan:    See Problem List for Assessment and Plan of chronic medical problems.

## 2017-08-23 DIAGNOSIS — F419 Anxiety disorder, unspecified: Secondary | ICD-10-CM | POA: Insufficient documentation

## 2017-08-23 NOTE — Assessment & Plan Note (Signed)
She is experiencing severe anxiety and stress related to her daughter's behavior and circumstances I agree she would benefit from medication.  Hopefully she will only need the short-term Discussed options including Effexor, BuSpar and clonazepam.  She feels she needs something that can to take effect immediately and she wants minimal side effects She is very low risk for addiction and abuse Will start clonazepam 0.25-0.5 mg twice daily as needed-most likely will need twice daily for now.  Advised her that if she does not need it to not take it. Discussed the risks of addiction.  Advised that it may cause drowsiness and cannot be taken with alcohol.  Advised that she should not let anyone else have access to this medication. She will continue to see if that therapist She will update me over the next couple of weeks via my chart

## 2017-09-04 ENCOUNTER — Telehealth (HOSPITAL_COMMUNITY): Payer: Self-pay | Admitting: *Deleted

## 2017-09-04 NOTE — Telephone Encounter (Signed)
Patient given detailed instructions per Stress Test Requisition Sheet for test on 09/09/17 at 7:30.Patient Notified to arrive 30 minutes early, and that it is imperative to arrive on time for appointment to keep from having the test rescheduled.  Patient verbalized understanding. Lindsey Ferrell

## 2017-09-08 ENCOUNTER — Encounter: Payer: Self-pay | Admitting: Gastroenterology

## 2017-09-09 ENCOUNTER — Ambulatory Visit (HOSPITAL_COMMUNITY): Payer: 59 | Attending: Cardiology

## 2017-09-09 ENCOUNTER — Ambulatory Visit (HOSPITAL_COMMUNITY): Payer: 59

## 2017-09-09 DIAGNOSIS — E785 Hyperlipidemia, unspecified: Secondary | ICD-10-CM | POA: Diagnosis not present

## 2017-09-09 DIAGNOSIS — J45909 Unspecified asthma, uncomplicated: Secondary | ICD-10-CM | POA: Diagnosis not present

## 2017-09-09 DIAGNOSIS — Z8249 Family history of ischemic heart disease and other diseases of the circulatory system: Secondary | ICD-10-CM | POA: Insufficient documentation

## 2017-09-09 DIAGNOSIS — R0789 Other chest pain: Secondary | ICD-10-CM | POA: Diagnosis not present

## 2017-09-15 NOTE — Progress Notes (Addendum)
Office Visit Note  Patient: Lindsey Ferrell             Date of Birth: [birth year]           MRN: [id number]             PCP: Binnie Rail, MD Referring: Starkweather Cellar, M.D. Visit Date: 09/16/2017 Occupation: Self-employed property management    Subjective:  Other (positive ANA )   History of Present Illness: Lindsey Ferrell is a 62 y.o. female seen in consultation per request of Dr. Havery Moros. According to patient her symptoms started about 6 years ago after an infection and gastroenteritis. And gradually she started having worsening of her diarrhea. She states the diarrhea was watery without any blood and will cause abdominal discomfort. She started losing weight and felt weak and also started experiencing hair loss. She had her initial colonoscopy by Dr. [name] at the time she was diagnosed with IBS her symptoms continue to get worse and did not improve. She had her second of colonoscopy about 3 months ago by Dr. Havery Moros and was diagnosed with microscopic colitis. She was given a course of Entocort with no response. She continues to have diarrhea. She denies any joint pain. She recalls about 3 years ago she had positive ANA on her blood work and was referred to Dr. Trudie Reed who did lab work and did not examine her and no further workup was adjusted. Her most recent labs also showed positive ANA in May 2017 at 1 is 10/08/1940 nuclear speckled. She denies any history of joint pain or joint swelling. She continues to have hair loss.  Activities of Daily Living:  Patient reports morning stiffness for 0 minute.   Patient Denies nocturnal pain.  Difficulty dressing/grooming: Denies Difficulty climbing stairs: Denies Difficulty getting out of chair: Denies Difficulty using hands for taps, buttons, cutlery, and/or writing: Denies   Review of Systems  Constitutional: Negative for fatigue, night sweats, weight gain, weight loss and weakness.  HENT: Positive for mouth dryness.  Negative for mouth sores, trouble swallowing, trouble swallowing and nose dryness.   Eyes: Positive for dryness. Negative for pain, redness and visual disturbance.  Respiratory: Negative for cough, shortness of breath and difficulty breathing.   Cardiovascular: Negative for chest pain, palpitations, hypertension, irregular heartbeat and swelling in legs/feet.  Gastrointestinal: Positive for diarrhea. Negative for blood in stool and constipation.  Endocrine: Negative for increased urination.  Genitourinary: Negative for vaginal dryness.  Musculoskeletal: Negative for arthralgias, joint pain, joint swelling, myalgias, muscle weakness, morning stiffness, muscle tenderness and myalgias.  Skin: Positive for color change and hair loss. Negative for rash, skin tightness, ulcers and sensitivity to sunlight.  Allergic/Immunologic: Negative for susceptible to infections.  Neurological: Negative for dizziness, memory loss and night sweats.  Hematological: Negative for swollen glands.  Psychiatric/Behavioral: Positive for sleep disturbance. Negative for depressed mood. The patient is nervous/anxious.     PMFS History:  Patient Active Problem List   Diagnosis Date Noted  . Anxiety 08/23/2017  . Positive ANA (antinuclear antibody) 01/11/2016  . Vitamin D deficiency 01/08/2016  . AP (abdominal pain) 01/08/2016  . IBS (irritable bowel syndrome) 05/03/2015  . 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 Diarrhea 02/23/2014  . Plantar fasciitis of left foot 01/31/2014  . GERD 05/15/2010  . Hyperlipidemia 06/09/2009  . THYROID NODULE, RIGHT 12/21/2008  . Hypothyroidism 12/21/2008  . SICKLE-CELL TRAIT 12/21/2008  . ALLERGIC RHINITIS 12/21/2008  . ASTHMA 12/21/2008    Past Medical History:  Diagnosis  Date  . ALLERGIC RHINITIS, SEASONAL   . Allergy   . Blood transfusion without reported diagnosis    Eptopic prgnancy   . GERD   . HYPERLIPIDEMIA, BORDERLINE   . Kidney stone 07/2013   recurrent  .  Melanoma ([place]) 2010   (R) knee area  . Sickle cell anemia ([facility]CC)    pt reports sickle cell trait  . Sickle-cell trait ([place])   . THYROID NODULE, RIGHT   . Unspecified hypothyroidism     Family History  Problem Relation Age of Onset  . Arthritis Mother   . Diverticulosis Mother   . Heart disease Mother   . Arthritis Father   . Heart disease Father   . Breast cancer Sister   . Diabetes Maternal Uncle   . Hypertension Other        parent & grandparent  . Heart disease Other        parent & grandparent  . Lung cancer Brother        smoker  . Graves' disease Daughter   . Colon cancer Neg Hx   . Esophageal cancer Neg Hx   . Rectal cancer Neg Hx   . Stomach cancer Neg Hx   . Pancreatic cancer Neg Hx   . Prostate cancer Neg Hx    Past Surgical History:  Procedure Laterality Date  . CHOLECYSTECTOMY  04/2005  . COLONOSCOPY  08/05/2012   tics only  . CYSTOSCOPY WITH RETROGRADE PYELOGRAM, URETEROSCOPY AND STENT PLACEMENT Left 07/06/2013   Procedure: CYSTOSCOPY WITH RETROGRADE PYELOGRAM, URETEROSCOPY AND STENT PLACEMENT;  Surgeon: Molli Hazard, MD;  Location: [facility];  Service: Urology;  Laterality: Left;  . CYSTOSCOPY WITH RETROGRADE PYELOGRAM, URETEROSCOPY AND STENT PLACEMENT Left 07/08/2013   Procedure: CYSTOSCOPY WITH LEFT  RETROGRADE PYELOGRAM  AND STENT PLACEMENT;  Surgeon: Ardis Hughs, MD;  Location: [facility];  Service: Urology;  Laterality: Left;  . CYSTOSCOPY WITH RETROGRADE PYELOGRAM, URETEROSCOPY AND STENT PLACEMENT Left 07/16/2013   Procedure: CYSTOSCOPY WITH RETROGRADE PYELOGRAM, URETEROSCOPY AND Stone removal, stent exchange;  Surgeon: Molli Hazard, MD;  Location: [facility];  Service: Urology;  Laterality: Left;  . DILATION AND CURETTAGE OF UTERUS    . Eptopic pregnancy    . ESOPHAGOGASTRODUODENOSCOPY  07/12/2010  . HOLMIUM LASER APPLICATION Left 53/02/6439   Procedure: HOLMIUM LASER APPLICATION;  Surgeon: Molli Hazard, MD;  Location: [facility];   Service: Urology;  Laterality: Left;  Marland Kitchen Melanoma in situ excision  2010   (R) thigh   . NASAL SINUS SURGERY    . TONSILLECTOMY     childhood  . UPPER GASTROINTESTINAL ENDOSCOPY     Social History   Social History Narrative  . Not on file     Objective: Vital Signs: BP 102/66 (BP Location: Left Arm, Patient Position: Sitting, Cuff Size: Normal)   Pulse 62   Resp 17   Ht 5' 5.5" (1.664 m)   Wt 177 lb (80.3 kg)   LMP  (LMP Unknown)   BMI 29.01 kg/m    Physical Exam  Constitutional: She is oriented to person, place, and time. She appears well-developed and well-nourished.  HENT:  Head: Normocephalic and atraumatic.  Eyes: Conjunctivae and EOM are normal.  Neck: Normal range of motion.  Cardiovascular: Normal rate, regular rhythm, normal heart sounds and intact distal pulses.  Pulmonary/Chest: Effort normal and breath sounds normal.  Abdominal: Soft. Bowel sounds are normal.  Lymphadenopathy:    She has no cervical adenopathy.  Neurological: She is  alert and oriented to person, place, and time.  Skin: Skin is warm and dry. Capillary refill takes less than 2 seconds.  Psychiatric: She has a normal mood and affect. Her behavior is normal.  Nursing note and vitals reviewed.    Musculoskeletal Exam: C-spine and thoracic lumbar spine good range of motion. Shoulder joints elbow joints wrist joint MCPs PIPs DIPs with good range of motion with no synovitis. Hip joints knee joints ankles MTPs PIPs DIPs are good range of motion with no synovitis.  CDAI Exam: No CDAI exam completed.    Investigation: No additional findings. CBC Latest Ref Rng & Units 01/28/2017 01/08/2016 03/08/2015  WBC 4.0 - 10.5 K/uL 7.0 7.3 5.8  Hemoglobin 12.0 - 15.0 g/dL 14.6 14.2 14.2  Hematocrit 36.0 - 46.0 % 42.1 41.6 42.3  Platelets 150.0 - 400.0 K/uL 238.0 239.0 225.0   CMP Latest Ref Rng & Units 01/28/2017 01/08/2016 03/08/2015  Glucose 70 - 99 mg/dL 106(H) 99 97  BUN 6 - 23 mg/dL 13 16 15   Creatinine  0.40 - 1.20 mg/dL 0.73 0.89 0.67  Sodium 135 - 145 mEq/L 139 142 140  Potassium 3.5 - 5.1 mEq/L 4.5 3.9 4.4  Chloride 96 - 112 mEq/L 104 105 107  CO2 19 - 32 mEq/L 27 30 27   Calcium 8.4 - 10.5 mg/dL 9.8 9.7 9.2  Total Protein 6.0 - 8.3 g/dL 7.4 7.3 6.8  Total Bilirubin 0.2 - 1.2 mg/dL 0.7 0.5 0.5  Alkaline Phos 39 - 117 U/L 88 87 94  AST 0 - 37 U/L 17 14 16   ALT 0 - 35 U/L 18 17 17    Component     Latest Ref Rng & Units 06/02/2017  Sed Rate     0 - 30 mm/hr 17  CRP     0.5 - 20.0 mg/dL 0.2 (L)   Imaging: No results found.  Speciality Comments: No specialty comments available.    Procedures:  No procedures performed Allergies: Statins and Droperidol   Assessment / Plan:     Visit Diagnoses: Positive ANA (antinuclear antibody) - 01/08/2016 speckled 1:640 -patient gives history of positive ANA. She also gives history of fatigue and hair loss. She has no other clinical features of autoimmune disease. She states she does have Raynauds and sicca symptoms. I will obtain following labs today well with this further. She had no synovitis on examination today. Plan: Urinalysis, Routine w reflex microscopic, ANA, Anti-scleroderma antibody, RNP Antibody, Anti-Smith antibody, Sjogrens syndrome-A extractable nuclear antibody, Sjogrens syndrome-B extractable nuclear antibody, Anti-DNA antibody, double-stranded, C3 and C4, Beta-2 glycoprotein antibodies, Cardiolipin antibodies, IgG, IgM, IgA, Lupus Anticoagulant Eval w/Reflex  History of alopecia: It could be related to autoimmune disease or chronic diarrhea.  History of weight loss: Probably related to chronic diarrhea   Plantar fasciitis of left foot: Resolved now.  History of vitamin D deficiency: She is on supplement.  History of IBS - diarrhea  Other microscopic colitis - Treated with Entocort. She will follow-up with her gastroenterologist.  Other medical problems are listed as follows:  History of asthma  History of  gastroesophageal reflux (GERD)  History of hypothyroidism  History of hyperlipidemia   History of Melanoma  Other fatigue - Plan: CBC with Differential/Platelet, COMPLETE METABOLIC PANEL WITH GFR, CK    Orders: Orders Placed This Encounter  Procedures  . CBC with Differential/Platelet  . COMPLETE METABOLIC PANEL WITH GFR  . Urinalysis, Routine w reflex microscopic  . CK  . ANA  . Anti-scleroderma antibody  .  RNP Antibody  . Anti-Smith antibody  . Sjogrens syndrome-A extractable nuclear antibody  . Sjogrens syndrome-B extractable nuclear antibody  . Anti-DNA antibody, double-stranded  . C3 and C4  . Beta-2 glycoprotein antibodies  . Cardiolipin antibodies, IgG, IgM, IgA  . Lupus Anticoagulant Eval w/Reflex   No orders of the defined types were placed in this encounter.   Face-to-face time spent with patient was 45 minutes. Greater than 50% of time was spent in counseling and coordination of care.  Follow-Up Instructions: No Follow-up on file.   Bo Merino, MD  Note - This record has been created using Editor, commissioning.  Chart creation errors have been sought, but may not always  have been located. Such creation errors do not reflect on  the standard of medical care.

## 2017-09-16 ENCOUNTER — Ambulatory Visit: Payer: 59 | Admitting: Rheumatology

## 2017-09-16 ENCOUNTER — Encounter: Payer: Self-pay | Admitting: Rheumatology

## 2017-09-16 VITALS — BP 102/66 | HR 62 | Resp 17 | Ht 65.5 in | Wt 177.0 lb

## 2017-09-16 DIAGNOSIS — C4371 Malignant melanoma of right lower limb, including hip: Secondary | ICD-10-CM | POA: Insufficient documentation

## 2017-09-16 DIAGNOSIS — Z872 Personal history of diseases of the skin and subcutaneous tissue: Secondary | ICD-10-CM

## 2017-09-16 DIAGNOSIS — Z87898 Personal history of other specified conditions: Secondary | ICD-10-CM | POA: Diagnosis not present

## 2017-09-16 DIAGNOSIS — Z8709 Personal history of other diseases of the respiratory system: Secondary | ICD-10-CM

## 2017-09-16 DIAGNOSIS — Z8719 Personal history of other diseases of the digestive system: Secondary | ICD-10-CM | POA: Diagnosis not present

## 2017-09-16 DIAGNOSIS — K52838 Other microscopic colitis: Secondary | ICD-10-CM

## 2017-09-16 DIAGNOSIS — Z8639 Personal history of other endocrine, nutritional and metabolic disease: Secondary | ICD-10-CM | POA: Diagnosis not present

## 2017-09-16 DIAGNOSIS — M722 Plantar fascial fibromatosis: Secondary | ICD-10-CM | POA: Diagnosis not present

## 2017-09-16 DIAGNOSIS — R5383 Other fatigue: Secondary | ICD-10-CM | POA: Diagnosis not present

## 2017-09-16 DIAGNOSIS — R768 Other specified abnormal immunological findings in serum: Secondary | ICD-10-CM | POA: Diagnosis not present

## 2017-09-18 LAB — ANA: ANA: POSITIVE — AB

## 2017-09-18 LAB — COMPLETE METABOLIC PANEL WITH GFR
AG Ratio: 1.7 (calc) (ref 1.0–2.5)
ALBUMIN MSPROF: 4.4 g/dL (ref 3.6–5.1)
ALT: 17 U/L (ref 6–29)
AST: 13 U/L (ref 10–35)
Alkaline phosphatase (APISO): 83 U/L (ref 33–130)
BUN: 16 mg/dL (ref 7–25)
CALCIUM: 9.5 mg/dL (ref 8.6–10.4)
CO2: 31 mmol/L (ref 20–32)
CREATININE: 0.73 mg/dL (ref 0.50–0.99)
Chloride: 105 mmol/L (ref 98–110)
GFR, EST AFRICAN AMERICAN: 102 mL/min/{1.73_m2} (ref >=60)
GFR, EST NON AFRICAN AMERICAN: 88 mL/min/{1.73_m2} (ref >=60)
GLOBULIN: 2.6 g/dL (ref 1.9–3.7)
Glucose, Bld: 90 mg/dL (ref 65–99)
Potassium: 4.4 mmol/L (ref 3.5–5.3)
SODIUM: 141 mmol/L (ref 135–146)
TOTAL PROTEIN: 7 g/dL (ref 6.1–8.1)
Total Bilirubin: 0.7 mg/dL (ref 0.2–1.2)

## 2017-09-18 LAB — BETA-2 GLYCOPROTEIN ANTIBODIES: Beta-2 Glyco 1 IgM: <9 SMU (ref <=20)

## 2017-09-18 LAB — CBC WITH DIFFERENTIAL/PLATELET
Basophils Absolute: 68 cells/uL (ref 0–200)
Basophils Relative: 1.1 %
Eosinophils Absolute: 112 cells/uL (ref 15–500)
Eosinophils Relative: 1.8 %
HCT: 43.2 % (ref 35.0–45.0)
Hemoglobin: 14.6 g/dL (ref 11.7–15.5)
Lymphs Abs: 1494 cells/uL (ref 850–3900)
MCH: 28.9 pg (ref 27.0–33.0)
MCHC: 33.8 g/dL (ref 32.0–36.0)
MCV: 85.5 fL (ref 80.0–100.0)
MONOS PCT: 9.8 %
MPV: 11.7 fL (ref 7.5–12.5)
NEUTROS PCT: 63.2 %
Neutro Abs: 3918 cells/uL (ref 1500–7800)
PLATELETS: 243 10*3/uL (ref 140–400)
RBC: 5.05 10*6/uL (ref 3.80–5.10)
RDW: 13 % (ref 11.0–15.0)
TOTAL LYMPHOCYTE: 24.1 %
WBC: 6.2 10*3/uL (ref 3.8–10.8)
WBCMIX: 608 {cells}/uL (ref 200–950)

## 2017-09-18 LAB — URINALYSIS, ROUTINE W REFLEX MICROSCOPIC
Bilirubin Urine: NEGATIVE
GLUCOSE, UA: NEGATIVE
Hgb urine dipstick: NEGATIVE
KETONES UR: NEGATIVE
Leukocytes, UA: NEGATIVE
NITRITE: NEGATIVE
Protein, ur: NEGATIVE
SPECIFIC GRAVITY, URINE: 1.015 (ref 1.001–1.03)

## 2017-09-18 LAB — SJOGRENS SYNDROME-A EXTRACTABLE NUCLEAR ANTIBODY: SSA (RO) (ENA) ANTIBODY, IGG: NEGATIVE AI

## 2017-09-18 LAB — LUPUS ANTICOAGULANT EVAL W/ REFLEX
PTT LA SCREEN: 31 s (ref <=40)
dRVVT Screen: 41 s (ref <=45)

## 2017-09-18 LAB — C3 AND C4
C3 Complement: 153 mg/dL (ref 83–193)
C4 COMPLEMENT: 37 mg/dL (ref 15–57)

## 2017-09-18 LAB — CK: Total CK: 38 U/L (ref 29–143)

## 2017-09-18 LAB — SJOGRENS SYNDROME-B EXTRACTABLE NUCLEAR ANTIBODY: SSB (La) (ENA) Antibody, IgG: <1 AI

## 2017-09-18 LAB — ANTI-SCLERODERMA ANTIBODY: SCLERODERMA (SCL-70) (ENA) ANTIBODY, IGG: NEGATIVE AI

## 2017-09-18 LAB — ANTI-NUCLEAR AB-TITER (ANA TITER): ANA Titer 1: 1:160 {titer} — ABNORMAL HIGH

## 2017-09-18 LAB — CARDIOLIPIN ANTIBODIES, IGG, IGM, IGA
Anticardiolipin IgG: <14 [GPL'U]
Anticardiolipin IgM: <12 [MPL'U]

## 2017-09-18 LAB — RNP ANTIBODY: Ribonucleic Protein(ENA) Antibody, IgG: <1 AI

## 2017-09-18 LAB — ANTI-SMITH ANTIBODY: ENA SM Ab Ser-aCnc: <1 AI

## 2017-09-18 LAB — ANTI-DNA ANTIBODY, DOUBLE-STRANDED: ds DNA Ab: <1 IU/mL

## 2017-09-18 NOTE — Progress Notes (Signed)
Will discuss labs at follow-up visit.

## 2017-09-21 ENCOUNTER — Encounter: Payer: Self-pay | Admitting: Internal Medicine

## 2017-09-21 ENCOUNTER — Encounter: Payer: Self-pay | Admitting: Rheumatology

## 2017-09-23 NOTE — Telephone Encounter (Signed)
I reviewed her labs. Based on her labs and urgent appointment is not needed. If there is a cancellation we should be happy to accommodate her. As regards to her diarrhea she is seeing a GI doctor and should follow up with the doctor. She should have evaluation by PCP for shortness of breath.

## 2017-10-27 ENCOUNTER — Ambulatory Visit: Payer: Self-pay | Admitting: Rheumatology

## 2017-10-28 ENCOUNTER — Ambulatory Visit: Payer: 59 | Admitting: Gastroenterology

## 2017-10-28 ENCOUNTER — Encounter: Payer: Self-pay | Admitting: Gastroenterology

## 2017-10-28 VITALS — BP 126/70 | HR 76 | Ht 65.0 in | Wt 175.8 lb

## 2017-10-28 DIAGNOSIS — K529 Noninfective gastroenteritis and colitis, unspecified: Secondary | ICD-10-CM | POA: Diagnosis not present

## 2017-10-28 DIAGNOSIS — K52832 Lymphocytic colitis: Secondary | ICD-10-CM

## 2017-10-28 MED ORDER — LOPERAMIDE HCL 2 MG PO TABS: 2.0000 mg | ORAL_TABLET | Freq: Every morning | ORAL | 0 refills | Status: AC

## 2017-10-28 MED ORDER — BISMUTH 262 MG PO CHEW: 3.0000 | CHEWABLE_TABLET | Freq: Three times a day (TID) | ORAL | 1 refills | Status: DC

## 2017-10-28 MED ORDER — BUDESONIDE ER 9 MG PO TB24: 9.0000 mg | ORAL_TABLET | Freq: Every day | ORAL | 1 refills | Status: DC

## 2017-10-28 NOTE — Progress Notes (Signed)
HPI :  62 year old female here for follow-up visit for chronic diarrhea.  She reports symptoms of chronic diarrhea for at least the past 6 years. She had an extensive workup previously with Dr. [name] to include negative infectious evaluation, weakly positive celiac labs but negative EGD with small bowel biopsies. Prior colonoscopy with random biopsies was negative. She's had a negative fecal elastase.  Her symptoms have persisted over the years and more severe, this led to a repeat colonoscopy with me in October 2018. She had some diverticulosis in her colon, it otherwise appeared normal. Biopsies were obtained and consistent with lymphocytic colitis. She denies any routine use of medications to cause this. She states she takes NSAIDs rarely, she has not taken them in 6 months. I provided her budesonide 9 mg once a day with a taper over 6 weeks. She thinks this may have mildly helped her symptoms but it did not provide any significant benefit that she can remember. She continues to have multiple loose stools per day, usually 20 minutes after eating with urgency she has a bowel movement. She states this frequently interfered with her life, she plans her visits out of the house around her bowel movements.   She has been tried on rifaximin and Questran in the past, she did not find any benefit from either of those interventions. She never took Viberzi given history of cholecystectomy. She has never tried using Imodium routinely. We have since repeated celiac serologies and this was negative.  Endoscopic history: Colonoscopy 06/17/2017 - normal TI, diverticulosis, internal hemorrhoids - otherwise normal, path c/w lymphocytic colitis Colonoscopy 08/2014 - normal with normal biopsies EGD 08/04/2014 - normal, with normal small bowel biopsies    Past Medical History:  Diagnosis Date  . ALLERGIC RHINITIS, SEASONAL   . Allergy   . Blood transfusion without reported diagnosis    Eptopic prgnancy   .  GERD   . HYPERLIPIDEMIA, BORDERLINE   . Kidney stone 07/2013   recurrent  . Lymphocytic colitis   . Melanoma ([place]) 2010   (R) knee area  . Sickle cell anemia ([facility]CC)    pt reports sickle cell trait  . Sickle-cell trait ([place])   . THYROID NODULE, RIGHT   . Unspecified hypothyroidism      Past Surgical History:  Procedure Laterality Date  . CHOLECYSTECTOMY  04/2005  . COLONOSCOPY  08/05/2012   tics only  . CYSTOSCOPY WITH RETROGRADE PYELOGRAM, URETEROSCOPY AND STENT PLACEMENT Left 07/06/2013   Procedure: CYSTOSCOPY WITH RETROGRADE PYELOGRAM, URETEROSCOPY AND STENT PLACEMENT;  Surgeon: Molli Hazard, MD;  Location: [facility];  Service: Urology;  Laterality: Left;  . CYSTOSCOPY WITH RETROGRADE PYELOGRAM, URETEROSCOPY AND STENT PLACEMENT Left 07/08/2013   Procedure: CYSTOSCOPY WITH LEFT  RETROGRADE PYELOGRAM  AND STENT PLACEMENT;  Surgeon: Ardis Hughs, MD;  Location: [facility];  Service: Urology;  Laterality: Left;  . CYSTOSCOPY WITH RETROGRADE PYELOGRAM, URETEROSCOPY AND STENT PLACEMENT Left 07/16/2013   Procedure: CYSTOSCOPY WITH RETROGRADE PYELOGRAM, URETEROSCOPY AND Stone removal, stent exchange;  Surgeon: Molli Hazard, MD;  Location: [facility];  Service: Urology;  Laterality: Left;  . DILATION AND CURETTAGE OF UTERUS    . Eptopic pregnancy    . ESOPHAGOGASTRODUODENOSCOPY  07/12/2010  . HOLMIUM LASER APPLICATION Left 69/02/7892   Procedure: HOLMIUM LASER APPLICATION;  Surgeon: Molli Hazard, MD;  Location: [facility];  Service: Urology;  Laterality: Left;  Marland Kitchen Melanoma in situ excision  2010   (R) thigh   . NASAL SINUS  SURGERY    . TONSILLECTOMY     childhood  . UPPER GASTROINTESTINAL ENDOSCOPY     Family History  Problem Relation Age of Onset  . Arthritis Mother   . Diverticulosis Mother   . Heart disease Mother   . Arthritis Father   . Heart disease Father   . Breast cancer Sister   . Diabetes Maternal Uncle   . Hypertension Other        parent & grandparent    . Heart disease Other        parent & grandparent  . Lung cancer Brother        smoker  . Graves' disease Daughter   . Colon cancer Neg Hx   . Esophageal cancer Neg Hx   . Rectal cancer Neg Hx   . Stomach cancer Neg Hx   . Pancreatic cancer Neg Hx   . Prostate cancer Neg Hx    Social History   Tobacco Use  . Smoking status: Never Smoker  . Smokeless tobacco: Never Used  . Tobacco comment: Married, 2 grown dtr with g-kids plus 2 adopted children, living at home  Substance Use Topics  . Alcohol use: Yes    Comment: rare  . Drug use: No   Current Outpatient Medications  Medication Sig Dispense Refill  . levothyroxine (SYNTHROID, LEVOTHROID) 137 MCG tablet Take 1 tablet (137 mcg total) by mouth daily. 90 tablet 3  . Bismuth 262 MG CHEW Chew 3 tablets by mouth 3 (three) times daily. 90 each 1  . Budesonide ER (UCERIS) 9 MG TB24 Take 9 mg by mouth daily. 30 tablet 1  . loperamide (IMODIUM A-D) 2 MG tablet Take 1 tablet (2 mg total) by mouth every morning. Then as needed. 30 tablet 0  . mometasone (NASONEX) 50 MCG/ACT nasal spray PLACE 2 SPRAYS INTO THE NOSTRILS ONCE DAILY (Patient not taking: Reported on 10/28/2017) 17 g 2   No current facility-administered medications for this visit.    Allergies  Allergen Reactions  . Statins     myalgias  . Droperidol     In combination with Fentanyl as Innovar causing Facial drawing     Review of Systems: All systems reviewed and negative except where noted in HPI.   Lab Results  Component Value Date   WBC 6.2 09/16/2017   HGB 14.6 09/16/2017   HCT 43.2 09/16/2017   MCV 85.5 09/16/2017   PLT 243 09/16/2017    Lab Results  Component Value Date   CREATININE 0.73 09/16/2017   BUN 16 09/16/2017   NA 141 09/16/2017   K 4.4 09/16/2017   CL 105 09/16/2017   CO2 31 09/16/2017    Lab Results  Component Value Date   ALT 17 09/16/2017   AST 13 09/16/2017   ALKPHOS 88 01/28/2017   BILITOT 0.7 09/16/2017     Physical  Exam: BP 126/70   Pulse 76   Ht 5\' 5"  (1.651 m)   Wt 175 lb 12.8 oz (79.7 kg)   LMP  (LMP Unknown)   BMI 29.25 kg/m  Constitutional: Pleasant,well-developed, female in no acute distress. HEENT: Normocephalic and atraumatic. Conjunctivae are normal. No scleral icterus. Neck supple.  Cardiovascular: Normal rate, regular rhythm.  Pulmonary/chest: Effort normal and breath sounds normal. No wheezing, rales or rhonchi. Abdominal: Soft, nondistended, nontender.There are no masses palpable. No hepatomegaly. Extremities: no edema Lymphadenopathy: No cervical adenopathy noted. Neurological: Alert and oriented to person place and time. Skin: Skin is warm and dry. No  rashes noted. Psychiatric: Normal mood and affect. Behavior is normal.   ASSESSMENT AND PLAN: 62 year old female with a history of chronic diarrhea as outlined above, recent workup has shown evidence of lymphocytic colitis.  Lymphocytic colitis - I suspect this is the likely cause for her chronic diarrhea, however she has not responded to budesonide initially which was disappointing and unusual. I discussed options at this time. We will try her on Imodium every morning and titrate up as needed. We will add bismuth 262 mg, take 3 tabs 3 times a day, to treat microscopic colitis. I will also prescribe Uceris 9mg  / day for another month to see if a topical steroid with dedicated colonic covered will help this. If she fails to respond to this regimen, the question is, does she have lymphocytic colitis failing to respond to medical therapy versus another etiology. She does not appear to have celiac disease based on her initial EGD and last serologic testing, however we may consider repeat EGD with small bowel biopsies or even consideration of a capsule endoscopy if symptoms persist. She has been seen by rheumatologist for her joint pains, they raised the possibility for Whipple's disease. She did not have any evidence of this on her prior EGD,  again if symptoms persist will consider repeating an EGD.  I asked her to avoid NSAIDs, and contact me in 1 month for reassessment to let me now she is doing. She agreed.  [place] Cellar, MD [facility] Pager [phone]

## 2017-10-28 NOTE — Patient Instructions (Addendum)
If you are age 64 or older, your body mass index should be between 23-30. Your Body mass index is 29.25 kg/m. If this is out of the aforementioned range listed, please consider follow up with your Primary Care Provider.  If you are age 18 or younger, your body mass index should be between 19-25. Your Body mass index is 29.25 kg/m. If this is out of the aformentioned range listed, please consider follow up with your Primary Care Provider.   We have sent the following medications to your pharmacy for you to pick up at your convenience: Uceris Bismuth Imodium  Please call us in a month and let us know how you are doing.  Thank you for entrusting me with your care and for choosing [facility], Dr. [place] Cellar

## 2017-10-30 ENCOUNTER — Telehealth: Payer: Self-pay | Admitting: Gastroenterology

## 2017-10-30 NOTE — Telephone Encounter (Signed)
Called and spoke to pt.  The Budesonide (Uceris) is not covered by insurance.  They want her to do a trial of Balsalazide first. Per Dr. Loni Muse that is not appropriate for her condition. Told pt that I will initiate an appeal to try and get the Uceris approved.  In the meantime Dr. Loni Muse wants her to try the Imodium and bismuth they discussed at clinic.  She said [facility] pharmacy does not carry bismuth.  Per Dr. Loni Muse she can use Pepto-Bismol OTC  in place of bismuth or try another pharmacy for the bismuth.  She is going out of town and will just get Pepto over the counter and try 3 tabs TID.

## 2017-10-31 NOTE — Telephone Encounter (Signed)
Yetta Flock, MD  Roetta Sessions, CMA        Thanks Jan.  I would not use Balsalazide, it does not work for microscopic colitis.   Patient was given immodium and bismuth, we will see if this helps. If she wishes to try regular budesonide again at 9mg  / day we can consider that, but she had no response the first time at all, I don't think it will likely help. Why don't we see how she does on immodium and bismuth and she can contact me in 1 month to see how she is doing. Thanks   Dr. Loni Muse   Previous Messages    ----- Message -----  From: Roetta Sessions, CMA  Sent: 10/29/2017 11:07 AM  To: Yetta Flock, MD  Subject: Lindsey Ferrell not covered                Insurance stating "trial of balsalazide required". Please advise. Thanks,  Jan

## 2017-11-04 NOTE — Telephone Encounter (Signed)
Prior Auth request for Uceris submitted via CoverMyMeds on 11-04-17 at 8:30am.

## 2017-11-13 ENCOUNTER — Telehealth: Payer: Self-pay | Admitting: Gastroenterology

## 2017-11-13 NOTE — Telephone Encounter (Signed)
PA is still under review.  We should get an answer within 24 to 72 hours. [facility] informed.

## 2017-11-13 NOTE — Telephone Encounter (Signed)
Prior auth APPROVED for Uceris 9mg . Reference # [id number] Approval period from 11-13-2017 to 11-13-2018

## 2017-11-13 NOTE — Telephone Encounter (Signed)
Called to check on status of PA.  It is still under review. We should have an answer within 24 to 72 hours.

## 2017-11-14 DIAGNOSIS — K52838 Other microscopic colitis: Secondary | ICD-10-CM | POA: Insufficient documentation

## 2017-11-14 NOTE — Progress Notes (Signed)
Office Visit Note  Patient: Lindsey Ferrell             Date of Birth: 1955-09-08           MRN: [id number]             PCP: Binnie Rail, MD Referring: Binnie Rail, MD Visit Date: 11/24/2017 Occupation: @GUAROCC @    Subjective:  Pain in multiple joints.   History of Present Illness: Lindsey Ferrell is a 62 y.o. female with history of arthralgias.  She states she has been having increased pain and discomfort in her bilateral knee joints which got worse after exercising.  She is been also having discomfort in her left ankle and left foot.  There is no history of swelling.  She has been having discomfort in her bilateral wrist joints in her bilateral hands which she describes over her PIP joints.  She has been experiencing lower back pain with out any radiculopathy.  She continues to have some rash on her face and some hair loss.  She states she has been seeing Dr. Havery Moros for chronic diarrhea she has been diagnosed with microscopic colitis and has tried multiple medications without much relief.  Activities of Daily Living:  Patient reports morning stiffness for 4-5 hours.   Patient Denies nocturnal pain.  Difficulty dressing/grooming: Denies Difficulty climbing stairs: Reports Difficulty getting out of chair: Denies Difficulty using hands for taps, buttons, cutlery, and/or writing: Denies   Review of Systems  Constitutional: Positive for fatigue. Negative for night sweats, weight gain and weight loss.  HENT: Positive for mouth dryness. Negative for mouth sores, trouble swallowing, trouble swallowing and nose dryness.   Eyes: Negative for pain, redness, visual disturbance and dryness.  Respiratory: Negative for cough, shortness of breath and difficulty breathing.   Cardiovascular: Negative for chest pain, palpitations, hypertension, irregular heartbeat and swelling in legs/feet.  Gastrointestinal: Positive for diarrhea. Negative for blood in stool and constipation.    Endocrine: Negative for increased urination.  Genitourinary: Negative for vaginal dryness.  Musculoskeletal: Positive for arthralgias, joint pain and morning stiffness. Negative for joint swelling, myalgias, muscle weakness, muscle tenderness and myalgias.  Skin: Positive for rash and hair loss. Negative for color change, skin tightness, ulcers and sensitivity to sunlight.  Allergic/Immunologic: Negative for susceptible to infections.  Neurological: Negative for dizziness, memory loss, night sweats and weakness.  Hematological: Negative for swollen glands.  Psychiatric/Behavioral: Positive for sleep disturbance. Negative for depressed mood. The patient is nervous/anxious.     PMFS History:  Patient Active Problem List   Diagnosis Date Noted  . Other microscopic colitis 11/14/2017  . Malignant melanoma of right lower extremity including hip ([place]) 09/16/2017  . Anxiety 08/23/2017  . Positive ANA (antinuclear antibody) 01/11/2016  . Vitamin D deficiency 01/08/2016  . AP (abdominal pain) 01/08/2016  . IBS (irritable bowel syndrome) 05/03/2015  . 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 Diarrhea 02/23/2014  . Plantar fasciitis of left foot 01/31/2014  . GERD 05/15/2010  . Hyperlipidemia 06/09/2009  . THYROID NODULE, RIGHT 12/21/2008  . Hypothyroidism 12/21/2008  . SICKLE-CELL TRAIT 12/21/2008  . ALLERGIC RHINITIS 12/21/2008  . ASTHMA 12/21/2008    Past Medical History:  Diagnosis Date  . ALLERGIC RHINITIS, SEASONAL   . Allergy   . Blood transfusion without reported diagnosis    Eptopic prgnancy   . GERD   . HYPERLIPIDEMIA, BORDERLINE   . Kidney stone 07/2013   recurrent  . Lymphocytic colitis   . Melanoma ([place])  2010   (R) knee area  . Sickle cell anemia (HCC)    pt reports sickle cell trait  . Sickle-cell trait ([place])   . THYROID NODULE, RIGHT   . Unspecified hypothyroidism     Family History  Problem Relation Age of Onset  . Arthritis Mother   . Diverticulosis Mother    . Heart disease Mother   . Arthritis Father   . Heart disease Father   . Breast cancer Sister   . Diabetes Maternal Uncle   . Hypertension Other        parent & grandparent  . Heart disease Other        parent & grandparent  . Lung cancer Brother        smoker  . Graves' disease Daughter   . Colon cancer Neg Hx   . Esophageal cancer Neg Hx   . Rectal cancer Neg Hx   . Stomach cancer Neg Hx   . Pancreatic cancer Neg Hx   . Prostate cancer Neg Hx    Past Surgical History:  Procedure Laterality Date  . CHOLECYSTECTOMY  04/2005  . COLONOSCOPY  08/05/2012   tics only  . CYSTOSCOPY WITH RETROGRADE PYELOGRAM, URETEROSCOPY AND STENT PLACEMENT Left 07/06/2013   Procedure: CYSTOSCOPY WITH RETROGRADE PYELOGRAM, URETEROSCOPY AND STENT PLACEMENT;  Surgeon: Molli Hazard, MD;  Location: [facility];  Service: Urology;  Laterality: Left;  . CYSTOSCOPY WITH RETROGRADE PYELOGRAM, URETEROSCOPY AND STENT PLACEMENT Left 07/08/2013   Procedure: CYSTOSCOPY WITH LEFT  RETROGRADE PYELOGRAM  AND STENT PLACEMENT;  Surgeon: Ardis Hughs, MD;  Location: [facility];  Service: Urology;  Laterality: Left;  . CYSTOSCOPY WITH RETROGRADE PYELOGRAM, URETEROSCOPY AND STENT PLACEMENT Left 07/16/2013   Procedure: CYSTOSCOPY WITH RETROGRADE PYELOGRAM, URETEROSCOPY AND Stone removal, stent exchange;  Surgeon: Molli Hazard, MD;  Location: [facility];  Service: Urology;  Laterality: Left;  . DILATION AND CURETTAGE OF UTERUS    . Eptopic pregnancy    . ESOPHAGOGASTRODUODENOSCOPY  07/12/2010  . HOLMIUM LASER APPLICATION Left 82/12/537   Procedure: HOLMIUM LASER APPLICATION;  Surgeon: Molli Hazard, MD;  Location: [facility];  Service: Urology;  Laterality: Left;  Marland Kitchen Melanoma in situ excision  2010   (R) thigh   . NASAL SINUS SURGERY    . TONSILLECTOMY     childhood  . UPPER GASTROINTESTINAL ENDOSCOPY     Social History   Social History Narrative  . Not on file     Objective: Vital Signs: BP 118/72  (BP Location: Left Arm, Patient Position: Sitting, Cuff Size: Normal)   Pulse 74   Resp 15   Ht 5\' 5"  (1.651 m)   Wt 177 lb (80.3 kg)   LMP  (LMP Unknown)   BMI 29.45 kg/m    Physical Exam  Constitutional: She is oriented to person, place, and time. She appears well-developed and well-nourished.  HENT:  Head: Normocephalic and atraumatic.  Eyes: Conjunctivae and EOM are normal.  Neck: Normal range of motion.  Cardiovascular: Normal rate, regular rhythm, normal heart sounds and intact distal pulses.  Pulmonary/Chest: Effort normal and breath sounds normal.  Abdominal: Soft. Bowel sounds are normal.  Lymphadenopathy:    She has no cervical adenopathy.  Neurological: She is alert and oriented to person, place, and time.  Skin: Skin is warm and dry. Capillary refill takes less than 2 seconds.  Psychiatric: She has a normal mood and affect. Her behavior is normal.  Nursing note and vitals reviewed.    Musculoskeletal  Exam: C-spine thoracic spine good range of motion.  She has good range of motion of lumbar spine.  She has some discomfort in the lumbar paravertebral region.  Shoulder joints elbow joints wrist joints with good range of motion.  She has DIP PIP thickening in her bilateral hands.  Tenderness across PIPs and DIPs was noted.  I did not notice any synovitis.  Hip joints, knee joints, ankles were in good range of motion.  She is crepitus in her bilateral knee joints.  No warmth swelling or effusion was noted.  She has bilateral dorsal spurs and pes cavus.  Some osteoarthritic changes were noted and DIP PIP joints with thickening. CDAI Exam: No CDAI exam completed.    Investigation: No additional findings. September 16, 2017 lupus anticoagulant negative, beta-2 negative, anticardiolipin negative, ANA 1: 160 homogeneous, ENA negative, C3-C4 normal, CK normal CBC Latest Ref Rng & Units 09/16/2017 01/28/2017 01/08/2016  WBC 3.8 - 10.8 Thousand/uL 6.2 7.0 7.3  Hemoglobin 11.7 - 15.5 g/dL  14.6 14.6 14.2  Hematocrit 35.0 - 45.0 % 43.2 42.1 41.6  Platelets 140 - 400 Thousand/uL 243 238.0 239.0   CMP     Component Value Date/Time   NA 141 09/16/2017 0920   NA 142 11/02/2013   K 4.4 09/16/2017 0920   CL 105 09/16/2017 0920   CO2 31 09/16/2017 0920   GLUCOSE 90 09/16/2017 0920   BUN 16 09/16/2017 0920   BUN 15 11/02/2013   CREATININE 0.73 09/16/2017 0920   CALCIUM 9.5 09/16/2017 0920   PROT 7.0 09/16/2017 0920   ALBUMIN 4.3 01/28/2017 0825   AST 13 09/16/2017 0920   ALT 17 09/16/2017 0920   ALKPHOS 88 01/28/2017 0825   BILITOT 0.7 09/16/2017 0920   GFRNONAA 88 09/16/2017 0920   GFRAA 102 09/16/2017 0920    Imaging: Xr Foot 2 Views Left  Result Date: 11/24/2017 Severe narrowing of first MTP was noted.  All PIP and DIP narrowing was noted.  No intertarsal joint space narrowing or tibiotalar joint space narrowing was noted.  Large calcaneal spur was noted.  No erosive changes were noted. Impression: These findings are consistent with osteoarthritis of the foot.  Xr Foot 2 Views Right  Result Date: 11/24/2017 Severe narrowing of first MTP was noted.  All PIP and DIP narrowing was noted.  No intertarsal joint space narrowing or tibiotalar joint space narrowing was noted.  Large calcaneal spur was noted.  No erosive changes were noted. Impression: These findings are consistent with osteoarthritis of the foot.  Xr Hand 2 View Left  Result Date: 11/24/2017 PIP and DIP narrowing was noted.  CMC joint space narrowing was noted.  No MCP intercarpal radiocarpal or metacarpocarpal joint space narrowing was noted.  No erosive changes were noted. Impression: These findings are consistent with osteoarthritis of the hand.  Xr Hand 2 View Right  Result Date: 11/24/2017 PIP and DIP narrowing was noted.  CMC joint space narrowing was noted.  No MCP intercarpal radiocarpal or metacarpocarpal joint space narrowing was noted.  No erosive changes were noted. Impression: These findings  are consistent with osteoarthritis of the hand.  Xr Knee 3 View Left  Result Date: 11/24/2017 Moderate medial compartment with intercondylar osteophytes were noted.  No chondrocalcinosis was noted.  Moderate patellofemoral narrowing was noted. Impression: These findings are consistent with moderate osteoarthritis and moderate chondromalacia patella.  Xr Knee 3 View Right  Result Date: 11/24/2017 Moderate medial compartment with intercondylar osteophytes were noted.  No chondrocalcinosis was noted.  Moderate patellofemoral narrowing was noted. Impression: These findings are consistent with moderate osteoarthritis and moderate chondromalacia patella.   Speciality Comments: No specialty comments available.    Procedures:  No procedures performed Allergies: Statins and Droperidol   Assessment / Plan:     Visit Diagnoses: Chronic pain of both knees -she has crepitus in her bilateral knee joints without any warmth swelling or effusion.  X-ray findings are consistent with moderate osteoarthritis and moderate chondromalacia patella plan x-ray bilateral knee joints 3 views.  A handout on knee exercises was given.  Chronic pain of left ankle: No warmth swelling or effusion was noted.  Pain in both feet -her clinical findings are consistent with osteoarthritis.  Plan: XR Foot 2 Views Right, XR Foot 2 Views Left.  X-rays reveal osteoarthritis in bilateral feet.  Pain in both hands: Plain x-ray of bilateral hands 2 views today.  The clinical findings and radiographic findings are consistent with osteoarthritis of the hands.  A handout on hand exercises was given.  Chronic midline low back pain without sciatica: The pain appears to be muscular in origin.  Have given her a handout on back exercises.  Positive ANA (antinuclear antibody) - Low titer, ENA negative, complements normal.  No clinical features of autoimmune disease.  Other medical problems are listed as follows: History of  alopecia  History of weight loss  Vitamin D deficiency  History of IBS  Other microscopic colitis  Malignant melanoma of right lower extremity including hip (HCC)  Hypothyroidism, unspecified type  Pure hypercholesterolemia    Orders: Orders Placed This Encounter  Procedures  . XR Hand 2 View Right  . XR Hand 2 View Left  . XR KNEE 3 VIEW RIGHT  . XR KNEE 3 VIEW LEFT  . XR Foot 2 Views Right  . XR Foot 2 Views Left   No orders of the defined types were placed in this encounter.   Face-to-face time spent with patient was 30 minutes.  Greater than 50% of time was spent in counseling and coordination of care.  Follow-Up Instructions: Return in about 6 months (around 05/27/2018) for OA, +ANA.   Bo Merino, MD  Note - This record has been created using Editor, commissioning.  Chart creation errors have been sought, but may not always  have been located. Such creation errors do not reflect on  the standard of medical care.

## 2017-11-24 ENCOUNTER — Ambulatory Visit (INDEPENDENT_AMBULATORY_CARE_PROVIDER_SITE_OTHER): Payer: Self-pay

## 2017-11-24 ENCOUNTER — Ambulatory Visit: Payer: 59 | Admitting: Rheumatology

## 2017-11-24 ENCOUNTER — Ambulatory Visit (INDEPENDENT_AMBULATORY_CARE_PROVIDER_SITE_OTHER): Payer: 59

## 2017-11-24 ENCOUNTER — Encounter: Payer: Self-pay | Admitting: Rheumatology

## 2017-11-24 VITALS — BP 118/72 | HR 74 | Resp 15 | Ht 65.0 in | Wt 177.0 lb

## 2017-11-24 DIAGNOSIS — M25572 Pain in left ankle and joints of left foot: Secondary | ICD-10-CM | POA: Diagnosis not present

## 2017-11-24 DIAGNOSIS — Z8719 Personal history of other diseases of the digestive system: Secondary | ICD-10-CM | POA: Diagnosis not present

## 2017-11-24 DIAGNOSIS — E559 Vitamin D deficiency, unspecified: Secondary | ICD-10-CM

## 2017-11-24 DIAGNOSIS — M25562 Pain in left knee: Secondary | ICD-10-CM

## 2017-11-24 DIAGNOSIS — M79672 Pain in left foot: Secondary | ICD-10-CM | POA: Diagnosis not present

## 2017-11-24 DIAGNOSIS — R7689 Other specified abnormal immunological findings in serum: Secondary | ICD-10-CM

## 2017-11-24 DIAGNOSIS — M25561 Pain in right knee: Secondary | ICD-10-CM

## 2017-11-24 DIAGNOSIS — M79641 Pain in right hand: Secondary | ICD-10-CM

## 2017-11-24 DIAGNOSIS — E039 Hypothyroidism, unspecified: Secondary | ICD-10-CM

## 2017-11-24 DIAGNOSIS — G8929 Other chronic pain: Secondary | ICD-10-CM

## 2017-11-24 DIAGNOSIS — M79642 Pain in left hand: Secondary | ICD-10-CM

## 2017-11-24 DIAGNOSIS — Z87898 Personal history of other specified conditions: Secondary | ICD-10-CM

## 2017-11-24 DIAGNOSIS — C4371 Malignant melanoma of right lower limb, including hip: Secondary | ICD-10-CM

## 2017-11-24 DIAGNOSIS — E7800 Pure hypercholesterolemia, unspecified: Secondary | ICD-10-CM

## 2017-11-24 DIAGNOSIS — M25541 Pain in joints of right hand: Secondary | ICD-10-CM | POA: Diagnosis not present

## 2017-11-24 DIAGNOSIS — R768 Other specified abnormal immunological findings in serum: Secondary | ICD-10-CM | POA: Diagnosis not present

## 2017-11-24 DIAGNOSIS — M25542 Pain in joints of left hand: Secondary | ICD-10-CM | POA: Diagnosis not present

## 2017-11-24 DIAGNOSIS — M79671 Pain in right foot: Secondary | ICD-10-CM

## 2017-11-24 DIAGNOSIS — Z872 Personal history of diseases of the skin and subcutaneous tissue: Secondary | ICD-10-CM

## 2017-11-24 DIAGNOSIS — M545 Low back pain, unspecified: Secondary | ICD-10-CM

## 2017-11-24 DIAGNOSIS — K52838 Other microscopic colitis: Secondary | ICD-10-CM

## 2017-11-24 NOTE — Patient Instructions (Signed)
Hand Exercises Hand exercises can be helpful to almost anyone. These exercises can strengthen the hands, improve flexibility and movement, and increase blood flow to the hands. These results can make work and daily tasks easier. Hand exercises can be especially helpful for people who have joint pain from arthritis or have nerve damage from overuse (carpal tunnel syndrome). These exercises can also help people who have injured a hand. Most of these hand exercises are fairly gentle stretching routines. You can do them often throughout the day. Still, it is a good idea to ask your health care provider which exercises would be best for you. Warming your hands before exercise may help to reduce stiffness. You can do this with gentle massage or by placing your hands in warm water for 15 minutes. Also, make sure you pay attention to your level of hand pain as you begin an exercise routine. Exercises Knuckle Bend Repeat this exercise 5-10 times with each hand. 1. Stand or sit with your arm, hand, and all five fingers pointed straight up. Make sure your wrist is straight. 2. Gently and slowly bend your fingers down and inward until the tips of your fingers are touching the tops of your palm. 3. Hold this position for a few seconds. 4. Extend your fingers out to their original position, all pointing straight up again.  Finger Fan Repeat this exercise 5-10 times with each hand. 1. Hold your arm and hand out in front of you. Keep your wrist straight. 2. Squeeze your hand into a fist. 3. Hold this position for a few seconds. 4. Fan out, or spread apart, your hand and fingers as much as possible, stretching every joint fully.  Tabletop Repeat this exercise 5-10 times with each hand. 1. Stand or sit with your arm, hand, and all five fingers pointed straight up. Make sure your wrist is straight. 2. Gently and slowly bend your fingers at the knuckles where they meet the hand until your hand is making an  upside-down L shape. Your fingers should form a tabletop. 3. Hold this position for a few seconds. 4. Extend your fingers out to their original position, all pointing straight up again.  Making Os Repeat this exercise 5-10 times with each hand. 1. Stand or sit with your arm, hand, and all five fingers pointed straight up. Make sure your wrist is straight. 2. Make an O shape by touching your pointer finger to your thumb. Hold for a few seconds. Then open your hand wide. 3. Repeat this motion with each finger on your hand.  Table Spread Repeat this exercise 5-10 times with each hand. 1. Place your hand on a table with your palm facing down. Make sure your wrist is straight. 2. Spread your fingers out as much as possible. Hold this position for a few seconds. 3. Slide your fingers back together again. Hold for a few seconds.  Ball Grip  Repeat this exercise 10-15 times with each hand. 1. Hold a tennis ball or another soft ball in your hand. 2. While slowly increasing pressure, squeeze the ball as hard as possible. 3. Squeeze as hard as you can for 3-5 seconds. 4. Relax and repeat.  Wrist Curls Repeat this exercise 10-15 times with each hand. 1. Sit in a chair that has armrests. 2. Hold a light weight in your hand, such as a dumbbell that weighs 1-3 pounds (0.5-1.4 kg). Ask your health care provider what weight would be best for you. 3. Rest your hand just over   the end of the chair arm with your palm facing up. 4. Gently pivot your wrist up and down while holding the weight. Do not twist your wrist from side to side.  Contact a health care provider if:  Your hand pain or discomfort gets much worse when you do an exercise.  Your hand pain or discomfort does not improve within 2 hours after you exercise. If you have any of these problems, stop doing these exercises right away. Do not do them again unless your health care provider says that you can. Get help right away if:  You  develop sudden, severe hand pain. If this happens, stop doing these exercises right away. Do not do them again unless your health care provider says that you can. This information is not intended to replace advice given to you by your health care provider. Make sure you discuss any questions you have with your health care provider. Document Released: 07/31/2015 Document Revised: 01/25/2016 Document Reviewed: 02/27/2015 [employer] Interactive Patient Education  2018 [employer] Knee Exercises Ask your health care provider which exercises are safe for you. Do exercises exactly as told by your health care provider and adjust them as directed. It is normal to feel mild stretching, pulling, tightness, or discomfort as you do these exercises, but you should stop right away if you feel sudden pain or your pain gets worse.Do not begin these exercises until told by your health care provider. STRETCHING AND RANGE OF MOTION EXERCISES These exercises warm up your muscles and joints and improve the movement and flexibility of your knee. These exercises also help to relieve pain, numbness, and tingling. Exercise A: Knee Extension, Prone 1. Lie on your abdomen on a bed. 2. Place your left / right knee just beyond the edge of the surface so your knee is not on the bed. You can put a towel under your left / right thigh just above your knee for comfort. 3. Relax your leg muscles and allow gravity to straighten your knee. You should feel a stretch behind your left / right knee. 4. Hold this position for __________ seconds. 5. Scoot up so your knee is supported between repetitions. Repeat __________ times. Complete this stretch __________ times a day. Exercise B: Knee Flexion, Active  1. Lie on your back with both knees straight. If this causes back discomfort, bend your left / right knee so your foot is flat on the floor. 2. Slowly slide your left / right heel back toward your buttocks until you feel a gentle  stretch in the front of your knee or thigh. 3. Hold this position for __________ seconds. 4. Slowly slide your left / right heel back to the starting position. Repeat __________ times. Complete this exercise __________ times a day. Exercise C: Quadriceps, Prone  1. Lie on your abdomen on a firm surface, such as a bed or padded floor. 2. Bend your left / right knee and hold your ankle. If you cannot reach your ankle or pant leg, loop a belt around your foot and grab the belt instead. 3. Gently pull your heel toward your buttocks. Your knee should not slide out to the side. You should feel a stretch in the front of your thigh and knee. 4. Hold this position for __________ seconds. Repeat __________ times. Complete this stretch __________ times a day. Exercise D: Hamstring, Supine 1. Lie on your back. 2. Loop a belt or towel over the ball of your left / right foot. The ball of   your foot is on the walking surface, right under your toes. 3. Straighten your left / right knee and slowly pull on the belt to raise your leg until you feel a gentle stretch behind your knee. ? Do not let your left / right knee bend while you do this. ? Keep your other leg flat on the floor. 4. Hold this position for __________ seconds. Repeat __________ times. Complete this stretch __________ times a day. STRENGTHENING EXERCISES These exercises build strength and endurance in your knee. Endurance is the ability to use your muscles for a long time, even after they get tired. Exercise E: Quadriceps, Isometric  1. Lie on your back with your left / right leg extended and your other knee bent. Put a rolled towel or small pillow under your knee if told by your health care provider. 2. Slowly tense the muscles in the front of your left / right thigh. You should see your kneecap slide up toward your hip or see increased dimpling just above the knee. This motion will push the back of the knee toward the floor. 3. For __________  seconds, keep the muscle as tight as you can without increasing your pain. 4. Relax the muscles slowly and completely. Repeat __________ times. Complete this exercise __________ times a day. Exercise F: Straight Leg Raises - Quadriceps 1. Lie on your back with your left / right leg extended and your other knee bent. 2. Tense the muscles in the front of your left / right thigh. You should see your kneecap slide up or see increased dimpling just above the knee. Your thigh may even shake a bit. 3. Keep these muscles tight as you raise your leg 4-6 inches (10-15 cm) off the floor. Do not let your knee bend. 4. Hold this position for __________ seconds. 5. Keep these muscles tense as you lower your leg. 6. Relax your muscles slowly and completely after each repetition. Repeat __________ times. Complete this exercise __________ times a day. Exercise G: Hamstring, Isometric 1. Lie on your back on a firm surface. 2. Bend your left / right knee approximately __________ degrees. 3. Dig your left / right heel into the surface as if you are trying to pull it toward your buttocks. Tighten the muscles in the back of your thighs to dig as hard as you can without increasing any pain. 4. Hold this position for __________ seconds. 5. Release the tension gradually and allow your muscles to relax completely for __________ seconds after each repetition. Repeat __________ times. Complete this exercise __________ times a day. Exercise H: Hamstring Curls  If told by your health care provider, do this exercise while wearing ankle weights. Begin with __________ weights. Then increase the weight by 1 lb (0.5 kg) increments. Do not wear ankle weights that are more than __________. 1. Lie on your abdomen with your legs straight. 2. Bend your left / right knee as far as you can without feeling pain. Keep your hips flat against the floor. 3. Hold this position for __________ seconds. 4. Slowly lower your leg to the  starting position.  Repeat __________ times. Complete this exercise __________ times a day. Exercise I: Squats (Quadriceps) 1. Stand in front of a table, with your feet and knees pointing straight ahead. You may rest your hands on the table for balance but not for support. 2. Slowly bend your knees and lower your hips like you are going to sit in a chair. ? Keep your weight over your heels,   not over your toes. ? Keep your lower legs upright so they are parallel with the table legs. ? Do not let your hips go lower than your knees. ? Do not bend lower than told by your health care provider. ? If your knee pain increases, do not bend as low. 3. Hold the squat position for __________ seconds. 4. Slowly push with your legs to return to standing. Do not use your hands to pull yourself to standing. Repeat __________ times. Complete this exercise __________ times a day. Exercise J: Wall Slides (Quadriceps)  1. Lean your back against a smooth wall or door while you walk your feet out 18-24 inches (46-61 cm) from it. 2. Place your feet hip-width apart. 3. Slowly slide down the wall or door until your knees bend __________ degrees. Keep your knees over your heels, not over your toes. Keep your knees in line with your hips. 4. Hold for __________ seconds. Repeat __________ times. Complete this exercise __________ times a day. Exercise K: Straight Leg Raises - Hip Abductors 1. Lie on your side with your left / right leg in the top position. Lie so your head, shoulder, knee, and hip line up. You may bend your bottom knee to help you keep your balance. 2. Roll your hips slightly forward so your hips are stacked directly over each other and your left / right knee is facing forward. 3. Leading with your heel, lift your top leg 4-6 inches (10-15 cm). You should feel the muscles in your outer hip lifting. ? Do not let your foot drift forward. ? Do not let your knee roll toward the ceiling. 4. Hold this  position for __________ seconds. 5. Slowly return your leg to the starting position. 6. Let your muscles relax completely after each repetition. Repeat __________ times. Complete this exercise __________ times a day. Exercise L: Straight Leg Raises - Hip Extensors 1. Lie on your abdomen on a firm surface. You can put a pillow under your hips if that is more comfortable. 2. Tense the muscles in your buttocks and lift your left / right leg about 4-6 inches (10-15 cm). Keep your knee straight as you lift your leg. 3. Hold this position for __________ seconds. 4. Slowly lower your leg to the starting position. 5. Let your leg relax completely after each repetition. Repeat __________ times. Complete this exercise __________ times a day. This information is not intended to replace advice given to you by your health care provider. Make sure you discuss any questions you have with your health care provider. Document Released: 07/03/2005 Document Revised: 05/13/2016 Document Reviewed: 06/25/2015 [employer] Interactive Patient Education  2018 [employer]. Back Exercises The following exercises strengthen the muscles that help to support the back. They also help to keep the lower back flexible. Doing these exercises can help to prevent back pain or lessen existing pain. If you have back pain or discomfort, try doing these exercises 2-3 times each day or as told by your health care provider. When the pain goes away, do them once each day, but increase the number of times that you repeat the steps for each exercise (do more repetitions). If you do not have back pain or discomfort, do these exercises once each day or as told by your health care provider. Exercises Single Knee to Chest  Repeat these steps 3-5 times for each leg: 1. Lie on your back on a firm bed or the floor with your legs extended. 2. Bring one knee to  your chest. Your other leg should stay extended and in contact with the floor. 3. Hold  your knee in place by grabbing your knee or thigh. 4. Pull on your knee until you feel a gentle stretch in your lower back. 5. Hold the stretch for 10-30 seconds. 6. Slowly release and straighten your leg.  Pelvic Tilt  Repeat these steps 5-10 times: 1. Lie on your back on a firm bed or the floor with your legs extended. 2. Bend your knees so they are pointing toward the ceiling and your feet are flat on the floor. 3. Tighten your lower abdominal muscles to press your lower back against the floor. This motion will tilt your pelvis so your tailbone points up toward the ceiling instead of pointing to your feet or the floor. 4. With gentle tension and even breathing, hold this position for 5-10 seconds.  Cat-Cow  Repeat these steps until your lower back becomes more flexible: 1. Get into a hands-and-knees position on a firm surface. Keep your hands under your shoulders, and keep your knees under your hips. You may place padding under your knees for comfort. 2. Let your head hang down, and point your tailbone toward the floor so your lower back becomes rounded like the back of a cat. 3. Hold this position for 5 seconds. 4. Slowly lift your head and point your tailbone up toward the ceiling so your back forms a sagging arch like the back of a cow. 5. Hold this position for 5 seconds.  Press-Ups  Repeat these steps 5-10 times: 1. Lie on your abdomen (face-down) on the floor. 2. Place your palms near your head, about shoulder-width apart. 3. While you keep your back as relaxed as possible and keep your hips on the floor, slowly straighten your arms to raise the top half of your body and lift your shoulders. Do not use your back muscles to raise your upper torso. You may adjust the placement of your hands to make yourself more comfortable. 4. Hold this position for 5 seconds while you keep your back relaxed. 5. Slowly return to lying flat on the floor.  Bridges  Repeat these steps 10  times: 1. Lie on your back on a firm surface. 2. Bend your knees so they are pointing toward the ceiling and your feet are flat on the floor. 3. Tighten your buttocks muscles and lift your buttocks off of the floor until your waist is at almost the same height as your knees. You should feel the muscles working in your buttocks and the back of your thighs. If you do not feel these muscles, slide your feet 1-2 inches farther away from your buttocks. 4. Hold this position for 3-5 seconds. 5. Slowly lower your hips to the starting position, and allow your buttocks muscles to relax completely.  If this exercise is too easy, try doing it with your arms crossed over your chest. Abdominal Crunches  Repeat these steps 5-10 times: 1. Lie on your back on a firm bed or the floor with your legs extended. 2. Bend your knees so they are pointing toward the ceiling and your feet are flat on the floor. 3. Cross your arms over your chest. 4. Tip your chin slightly toward your chest without bending your neck. 5. Tighten your abdominal muscles and slowly raise your trunk (torso) high enough to lift your shoulder blades a tiny bit off of the floor. Avoid raising your torso higher than that, because it can put too  much stress on your low back and it does not help to strengthen your abdominal muscles. 6. Slowly return to your starting position.  Back Lifts Repeat these steps 5-10 times: 1. Lie on your abdomen (face-down) with your arms at your sides, and rest your forehead on the floor. 2. Tighten the muscles in your legs and your buttocks. 3. Slowly lift your chest off of the floor while you keep your hips pressed to the floor. Keep the back of your head in line with the curve in your back. Your eyes should be looking at the floor. 4. Hold this position for 3-5 seconds. 5. Slowly return to your starting position.  Contact a health care provider if:  Your back pain or discomfort gets much worse when you do an  exercise.  Your back pain or discomfort does not lessen within 2 hours after you exercise. If you have any of these problems, stop doing these exercises right away. Do not do them again unless your health care provider says that you can. Get help right away if:  You develop sudden, severe back pain. If this happens, stop doing the exercises right away. Do not do them again unless your health care provider says that you can. This information is not intended to replace advice given to you by your health care provider. Make sure you discuss any questions you have with your health care provider. Document Released: 09/26/2004 Document Revised: 12/27/2015 Document Reviewed: 10/13/2014 Elsevier Interactive Patient Education  2017 [employer] Natural anti-inflammatories  You can purchase these at [employer], [employer] or online.  . Turmeric (capsules)  . Ginger (ginger root or capsules)  . Omega 3 (Fish, flax seeds, chia seeds, walnuts, almonds)  . Tart cherry (dried or extract)   Patient should be under the care of a physician while taking these supplements. This may not be reproduced without the permission of Dr. Bo Merino.

## 2017-12-07 NOTE — Progress Notes (Signed)
Subjective:    Patient ID: Lindsey Ferrell, female    DOB: 1955-12-13, 62 y.o.   MRN: [id number]  HPI The patient is here for follow up.   Need to send to amy [place]  Hyperlipidemia: She is not on any medication. She is compliant with a low fat/cholesterol diet. She is exercising regularly.  She is working on weight loss.  Microscopic colitis:  She is following with Dr Havery Moros.  She has tried multiple medications in the past.  The Budesonide did not help the colitis.  She is taking one imodium and one pepto daily.  That has taken away the urgency and her stools are somewhat solid.  She wanted to make sure the taking his medications on a daily basis was safe.  Chronic joint pain:  She is following with Dr Estanislado Pandy.  She has chronic osteoarthritis in her knees, hands.  She has a positive ANA, but no evidence of an autoimmune disease at this point per Dr Estanislado Pandy.  She is concerned about her achiness in her joints that come and go.  She has a rash on her face.  She has a rash throughout her scalp, her hair is coming out.  She is concerned about the history of positive ANA.   She was advised to start tumeric with ginger and pepper and drink tart cherry juice.  She has been started on melatonin at night.  She is doing all that.  Her biggest concern is that if she does have an autoimmune disease is a doing damage to her organs now.  She is working out at a gym 3/week with a Clinical research associate.  Hyperglycemia: She has a history of elevated sugars.  She is eating very healthy, is exercising regularly and has lost weight.  She did wonder if a weight loss medication would help her lose weight.  Medications and allergies reviewed with patient and updated if appropriate.  Patient Active Problem List   Diagnosis Date Noted  . Hyperglycemia 12/09/2017  . Other microscopic colitis 11/14/2017  . Malignant melanoma of right lower extremity including hip ([place]) 09/16/2017  . Anxiety 08/23/2017  . Positive  ANA (antinuclear antibody) 01/11/2016  . Vitamin D deficiency 01/08/2016  . AP (abdominal pain) 01/08/2016  . IBS (irritable bowel syndrome) 05/03/2015  . 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 Diarrhea 02/23/2014  . Plantar fasciitis of left foot 01/31/2014  . GERD 05/15/2010  . Hyperlipidemia 06/09/2009  . THYROID NODULE, RIGHT 12/21/2008  . Hypothyroidism 12/21/2008  . SICKLE-CELL TRAIT 12/21/2008  . ALLERGIC RHINITIS 12/21/2008  . ASTHMA 12/21/2008    Current Outpatient Medications on File Prior to Visit  Medication Sig Dispense Refill  . BLACK PEPPER-TURMERIC PO Take by mouth daily.    . Ergocalciferol (VITAMIN D2) 2000 units TABS Take 2,000 Units by mouth daily.    Marland Kitchen levothyroxine (SYNTHROID, LEVOTHROID) 137 MCG tablet Take 1 tablet (137 mcg total) by mouth daily. 90 tablet 3  . loperamide (IMODIUM A-D) 2 MG tablet Take 1 tablet (2 mg total) by mouth every morning. Then as needed. 30 tablet 0  . MELATONIN PO Take by mouth at bedtime.    . mometasone (NASONEX) 50 MCG/ACT nasal spray PLACE 2 SPRAYS INTO THE NOSTRILS ONCE DAILY (Patient taking differently: PLACE 2 SPRAYS INTO THE NOSTRILS ONCE DAILY, AS NEEDED) 17 g 2   No current facility-administered medications on file prior to visit.     Past Medical History:  Diagnosis Date  . ALLERGIC RHINITIS, SEASONAL   .  Allergy   . Blood transfusion without reported diagnosis    Eptopic prgnancy   . GERD   . HYPERLIPIDEMIA, BORDERLINE   . Kidney stone 07/2013   recurrent  . Lymphocytic colitis   . Melanoma ([place]) 2010   (R) knee area  . Sickle cell anemia ([facility])    pt reports sickle cell trait  . Sickle-cell trait ([place])   . THYROID NODULE, RIGHT   . Unspecified hypothyroidism     Past Surgical History:  Procedure Laterality Date  . CHOLECYSTECTOMY  04/2005  . COLONOSCOPY  08/05/2012   tics only  . CYSTOSCOPY WITH RETROGRADE PYELOGRAM, URETEROSCOPY AND STENT PLACEMENT Left 07/06/2013   Procedure: CYSTOSCOPY WITH  RETROGRADE PYELOGRAM, URETEROSCOPY AND STENT PLACEMENT;  Surgeon: Molli Hazard, MD;  Location: [facility];  Service: Urology;  Laterality: Left;  . CYSTOSCOPY WITH RETROGRADE PYELOGRAM, URETEROSCOPY AND STENT PLACEMENT Left 07/08/2013   Procedure: CYSTOSCOPY WITH LEFT  RETROGRADE PYELOGRAM  AND STENT PLACEMENT;  Surgeon: Ardis Hughs, MD;  Location: [facility];  Service: Urology;  Laterality: Left;  . CYSTOSCOPY WITH RETROGRADE PYELOGRAM, URETEROSCOPY AND STENT PLACEMENT Left 07/16/2013   Procedure: CYSTOSCOPY WITH RETROGRADE PYELOGRAM, URETEROSCOPY AND Stone removal, stent exchange;  Surgeon: Molli Hazard, MD;  Location: [facility];  Service: Urology;  Laterality: Left;  . DILATION AND CURETTAGE OF UTERUS    . Eptopic pregnancy    . ESOPHAGOGASTRODUODENOSCOPY  07/12/2010  . HOLMIUM LASER APPLICATION Left 79/04/9210   Procedure: HOLMIUM LASER APPLICATION;  Surgeon: Molli Hazard, MD;  Location: [facility];  Service: Urology;  Laterality: Left;  Marland Kitchen Melanoma in situ excision  2010   (R) thigh   . NASAL SINUS SURGERY    . TONSILLECTOMY     childhood  . UPPER GASTROINTESTINAL ENDOSCOPY      Social History   Socioeconomic History  . Marital status: Married    Spouse name: Not on file  . Number of children: Not on file  . Years of education: Not on file  . Highest education level: Not on file  Occupational History  . Not on file  Social Needs  . Financial resource strain: Not on file  . Food insecurity:    Worry: Not on file    Inability: Not on file  . Transportation needs:    Medical: Not on file    Non-medical: Not on file  Tobacco Use  . Smoking status: Never Smoker  . Smokeless tobacco: Never Used  . Tobacco comment: Married, 2 grown dtr with g-kids plus 2 adopted children, living at home  Substance and Sexual Activity  . Alcohol use: Not Currently    Comment: rare  . Drug use: Never  . Sexual activity: Not on file  Lifestyle  . Physical activity:    Days  per week: Not on file    Minutes per session: Not on file  . Stress: Not on file  Relationships  . Social connections:    Talks on phone: Not on file    Gets together: Not on file    Attends religious service: Not on file    Active member of club or organization: Not on file    Attends meetings of clubs or organizations: Not on file    Relationship status: Not on file  Other Topics Concern  . Not on file  Social History Narrative  . Not on file    Family History  Problem Relation Age of Onset  . Arthritis Mother   .  Diverticulosis Mother   . Heart disease Mother   . Arthritis Father   . Heart disease Father   . Breast cancer Sister   . Diabetes Maternal Uncle   . Hypertension Other        parent & grandparent  . Heart disease Other        parent & grandparent  . Lung cancer Brother        smoker  . Graves' disease Daughter   . Colon cancer Neg Hx   . Esophageal cancer Neg Hx   . Rectal cancer Neg Hx   . Stomach cancer Neg Hx   . Pancreatic cancer Neg Hx   . Prostate cancer Neg Hx     Review of Systems  Constitutional: Negative for chills and fever.  Respiratory: Negative for cough, shortness of breath and wheezing.   Cardiovascular: Negative for chest pain, palpitations and leg swelling.  Gastrointestinal: Positive for diarrhea (Controlled). Negative for abdominal pain.  Musculoskeletal: Positive for arthralgias.  Skin: Positive for rash (Face, scalp).  Neurological: Negative for light-headedness and headaches.       Objective:   Vitals:   12/09/17 0936  BP: 110/80  Pulse: 63  Resp: 16  Temp: 98 F (36.7 C)  SpO2: 98%   BP Readings from Last 3 Encounters:  12/09/17 110/80  11/24/17 118/72  10/28/17 126/70   Wt Readings from Last 3 Encounters:  12/09/17 173 lb (78.5 kg)  11/24/17 177 lb (80.3 kg)  10/28/17 175 lb 12.8 oz (79.7 kg)   Body mass index is 28.79 kg/m.   Physical Exam    Constitutional: Appears well-developed and well-nourished.  No distress.  HENT:  Head: Normocephalic and atraumatic.  Neck: Neck supple. No tracheal deviation present. No thyromegaly present.  No cervical lymphadenopathy Cardiovascular: Normal rate, regular rhythm and normal heart sounds.   No murmur heard. No carotid bruit .  No edema Pulmonary/Chest: Effort normal and breath sounds normal. No respiratory distress. No has no wheezes. No rales.  Skin: Skin is warm and dry. Not diaphoretic.  Mild generalized erythema scalp Psychiatric: Normal mood and affect. Behavior is normal.      Assessment & Plan:    See Problem List for Assessment and Plan of chronic medical problems.

## 2017-12-09 ENCOUNTER — Telehealth: Payer: Self-pay | Admitting: Rheumatology

## 2017-12-09 ENCOUNTER — Ambulatory Visit: Payer: 59 | Admitting: Internal Medicine

## 2017-12-09 ENCOUNTER — Encounter: Payer: Self-pay | Admitting: Internal Medicine

## 2017-12-09 VITALS — BP 110/80 | HR 63 | Temp 98.0°F | Resp 16 | Wt 173.0 lb

## 2017-12-09 DIAGNOSIS — E7849 Other hyperlipidemia: Secondary | ICD-10-CM | POA: Diagnosis not present

## 2017-12-09 DIAGNOSIS — R768 Other specified abnormal immunological findings in serum: Secondary | ICD-10-CM | POA: Diagnosis not present

## 2017-12-09 DIAGNOSIS — R739 Hyperglycemia, unspecified: Secondary | ICD-10-CM | POA: Diagnosis not present

## 2017-12-09 DIAGNOSIS — E039 Hypothyroidism, unspecified: Secondary | ICD-10-CM

## 2017-12-09 DIAGNOSIS — K52838 Other microscopic colitis: Secondary | ICD-10-CM | POA: Diagnosis not present

## 2017-12-09 NOTE — Telephone Encounter (Signed)
Patient called stating she saw her PCP Dr. Celso Amy regarding the rash on her neck, face, and scalp who told patient to make an appointment with her dermatologist.  Patient called Dr. Amy Martinique at [facility] and they are requesting lab results from last appointment, as well as a full history of patient's condition before they schedule an appointment.

## 2017-12-09 NOTE — Assessment & Plan Note (Signed)
Check tsh  Titrate med dose if needed  

## 2017-12-09 NOTE — Patient Instructions (Addendum)
See your dermatologist.     Have blood work done tomorrow.     Weight loss medications: saxenda - injection contrave   -  Wellbutrin/naltrexone  belivq  qsymia - phentermine/topiramate

## 2017-12-09 NOTE — Assessment & Plan Note (Signed)
Positive ANA up to 1: 640-lower when last checked Has seen Dr. Estanislado Pandy Has diffuse osteoarthritis Has rash on face and scalp Per rheumatology-no evidence of autoimmune disease now She has a follow-up in 6 months for reevaluation Advised her to see dermatology regarding her rashes

## 2017-12-09 NOTE — Assessment & Plan Note (Addendum)
Check a1c Low sugar / carb diet Stressed regular exercise,  Continue weight loss efforts  

## 2017-12-09 NOTE — Assessment & Plan Note (Signed)
Budesonide not effective Taking Pepto and Imodium-1 pill of each daily and this is controlling her symptoms We will continue above

## 2017-12-09 NOTE — Assessment & Plan Note (Signed)
Has not tolerated statins in the past We will check lipid panel, CMP Continue weight loss efforts, regular exercise and healthy diet

## 2017-12-10 NOTE — Telephone Encounter (Signed)
Patient advised she will need to come by the office and sign a release of records.

## 2017-12-12 ENCOUNTER — Telehealth: Payer: Self-pay | Admitting: Internal Medicine

## 2017-12-12 NOTE — Telephone Encounter (Signed)
Faxed Request to [facility] for patients last mammogram 12/12/17 LM

## 2018-02-01 NOTE — Progress Notes (Signed)
Subjective:    Patient ID: Lindsey Ferrell, female    DOB: 1956/02/19, 62 y.o.   MRN: [id number]  HPI She is here for a physical exam.     Medications and allergies reviewed with patient and updated if appropriate.  Patient Active Problem List   Diagnosis Date Noted  . Hyperglycemia 12/09/2017  . Other microscopic colitis 11/14/2017  . Malignant melanoma of right lower extremity including hip ([place]) 09/16/2017  . Anxiety 08/23/2017  . Positive ANA (antinuclear antibody) 01/11/2016  . Vitamin D deficiency 01/08/2016  . IBS (irritable bowel syndrome) 05/03/2015  . 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 Plantar fasciitis of left foot 01/31/2014  . GERD 05/15/2010  . Hyperlipidemia 06/09/2009  . THYROID NODULE, RIGHT 12/21/2008  . Hypothyroidism 12/21/2008  . SICKLE-CELL TRAIT 12/21/2008  . ALLERGIC RHINITIS 12/21/2008  . ASTHMA 12/21/2008    Current Outpatient Medications on File Prior to Visit  Medication Sig Dispense Refill  . BLACK PEPPER-TURMERIC PO Take by mouth daily.    . Ergocalciferol (VITAMIN D2) 2000 units TABS Take 2,000 Units by mouth daily.    Marland Kitchen levothyroxine (SYNTHROID, LEVOTHROID) 137 MCG tablet Take 1 tablet (137 mcg total) by mouth daily. 90 tablet 3  . loperamide (IMODIUM A-D) 2 MG tablet Take 1 tablet (2 mg total) by mouth every morning. Then as needed. 30 tablet 0  . MELATONIN PO Take by mouth at bedtime.    . mometasone (NASONEX) 50 MCG/ACT nasal spray PLACE 2 SPRAYS INTO THE NOSTRILS ONCE DAILY (Patient taking differently: PLACE 2 SPRAYS INTO THE NOSTRILS ONCE DAILY, AS NEEDED) 17 g 2   No current facility-administered medications on file prior to visit.     Past Medical History:  Diagnosis Date  . ALLERGIC RHINITIS, SEASONAL   . Allergy   . Blood transfusion without reported diagnosis    Eptopic prgnancy   . GERD   . HYPERLIPIDEMIA, BORDERLINE   . Kidney stone 07/2013   recurrent  . Lymphocytic colitis   . Melanoma ([place]) 2010   (R)  knee area  . Sickle cell anemia (H[facility])    pt reports sickle cell trait  . Sickle-cell trait ([place])   . THYROID NODULE, RIGHT   . Unspecified hypothyroidism     Past Surgical History:  Procedure Laterality Date  . CHOLECYSTECTOMY  04/2005  . COLONOSCOPY  08/05/2012   tics only  . CYSTOSCOPY WITH RETROGRADE PYELOGRAM, URETEROSCOPY AND STENT PLACEMENT Left 07/06/2013   Procedure: CYSTOSCOPY WITH RETROGRADE PYELOGRAM, URETEROSCOPY AND STENT PLACEMENT;  Surgeon: Molli Hazard, MD;  Location: [facility];  Service: Urology;  Laterality: Left;  . CYSTOSCOPY WITH RETROGRADE PYELOGRAM, URETEROSCOPY AND STENT PLACEMENT Left 07/08/2013   Procedure: CYSTOSCOPY WITH LEFT  RETROGRADE PYELOGRAM  AND STENT PLACEMENT;  Surgeon: Ardis Hughs, MD;  Location: [facility];  Service: Urology;  Laterality: Left;  . CYSTOSCOPY WITH RETROGRADE PYELOGRAM, URETEROSCOPY AND STENT PLACEMENT Left 07/16/2013   Procedure: CYSTOSCOPY WITH RETROGRADE PYELOGRAM, URETEROSCOPY AND Stone removal, stent exchange;  Surgeon: Molli Hazard, MD;  Location: [facility];  Service: Urology;  Laterality: Left;  . DILATION AND CURETTAGE OF UTERUS    . Eptopic pregnancy    . ESOPHAGOGASTRODUODENOSCOPY  07/12/2010  . HOLMIUM LASER APPLICATION Left 93/03/9023   Procedure: HOLMIUM LASER APPLICATION;  Surgeon: Molli Hazard, MD;  Location: [facility];  Service: Urology;  Laterality: Left;  Marland Kitchen Melanoma in situ excision  2010   (R) thigh   . NASAL SINUS SURGERY    .  TONSILLECTOMY     childhood  . UPPER GASTROINTESTINAL ENDOSCOPY      Social History   Socioeconomic History  . Marital status: Married    Spouse name: Not on file  . Number of children: Not on file  . Years of education: Not on file  . Highest education level: Not on file  Occupational History  . Not on file  Social Needs  . Financial resource strain: Not on file  . Food insecurity:    Worry: Not on file    Inability: Not on file  . Transportation needs:     Medical: Not on file    Non-medical: Not on file  Tobacco Use  . Smoking status: Never Smoker  . Smokeless tobacco: Never Used  . Tobacco comment: Married, 2 grown dtr with g-kids plus 2 adopted children, living at home  Substance and Sexual Activity  . Alcohol use: Not Currently    Comment: rare  . Drug use: Never  . Sexual activity: Not on file  Lifestyle  . Physical activity:    Days per week: Not on file    Minutes per session: Not on file  . Stress: Not on file  Relationships  . Social connections:    Talks on phone: Not on file    Gets together: Not on file    Attends religious service: Not on file    Active member of club or organization: Not on file    Attends meetings of clubs or organizations: Not on file    Relationship status: Not on file  Other Topics Concern  . Not on file  Social History Narrative  . Not on file    Family History  Problem Relation Age of Onset  . Arthritis Mother   . Diverticulosis Mother   . Heart disease Mother   . Arthritis Father   . Heart disease Father   . Breast cancer Sister   . Diabetes Maternal Uncle   . Hypertension Other        parent & grandparent  . Heart disease Other        parent & grandparent  . Lung cancer Brother        smoker  . Graves' disease Daughter   . Colon cancer Neg Hx   . Esophageal cancer Neg Hx   . Rectal cancer Neg Hx   . Stomach cancer Neg Hx   . Pancreatic cancer Neg Hx   . Prostate cancer Neg Hx     Review of Systems     Objective:  There were no vitals filed for this visit. There were no vitals filed for this visit. There is no height or weight on file to calculate BMI.  Wt Readings from Last 3 Encounters:  12/09/17 173 lb (78.5 kg)  11/24/17 177 lb (80.3 kg)  10/28/17 175 lb 12.8 oz (79.7 kg)     Physical Exam Constitutional: She appears well-developed and well-nourished. No distress.  HENT:  Head: Normocephalic and atraumatic.  Right Ear: External ear normal. Normal ear canal  and TM Left Ear: External ear normal.  Normal ear canal and TM Mouth/Throat: Oropharynx is clear and moist.  Eyes: Conjunctivae and EOM are normal.  Neck: Neck supple. No tracheal deviation present. No thyromegaly present.  No carotid bruit  Cardiovascular: Normal rate, regular rhythm and normal heart sounds.   No murmur heard.  No edema. Pulmonary/Chest: Effort normal and breath sounds normal. No respiratory distress. She has no wheezes. She has no  rales.  Breast: deferred to Gyn Abdominal: Soft. She exhibits no distension. There is no tenderness.  Lymphadenopathy: She has no cervical adenopathy.  Skin: Skin is warm and dry. She is not diaphoretic.  Psychiatric: She has a normal mood and affect. Her behavior is normal.        Assessment & Plan:   Physical exam: Screening blood work  ordered Immunizations  Discussed shingrix, others up to date Colonoscopy   Up to date  Mammogram   Up to date  Gyn    Dexa           Eye exams    EKG   Done 07/2016 Exercise Weight Skin  Substance abuse  See Problem List for Assessment and Plan of chronic medical problems.       This encounter was created in error - please disregard.

## 2018-02-01 NOTE — Patient Instructions (Signed)
Test(s) ordered today. Your results will be released to Ollie (or called to you) after review, usually within 72hours after test completion. If any changes need to be made, you will be notified at that same time.  All other Health Maintenance issues reviewed.   All recommended immunizations and age-appropriate screenings are up-to-date or discussed.  No immunizations administered today.   Medications reviewed and updated.  Changes include  /  No changes recommended at this time.  Your prescription(s) have been submitted to your pharmacy. Please take as directed and contact our office if you believe you are having problem(s) with the medication(s).  A referral was ordered for   Please followup in one year   Health Maintenance, Female Adopting a healthy lifestyle and getting preventive care can go a long way to promote health and wellness. Talk with your health care provider about what schedule of regular examinations is right for you. This is a good chance for you to check in with your provider about disease prevention and staying healthy. In between checkups, there are plenty of things you can do on your own. Experts have done a lot of research about which lifestyle changes and preventive measures are most likely to keep you healthy. Ask your health care provider for more information. Weight and diet Eat a healthy diet  Be sure to include plenty of vegetables, fruits, low-fat dairy products, and lean protein.  Do not eat a lot of foods high in solid fats, added sugars, or salt.  Get regular exercise. This is one of the most important things you can do for your health. ? Most adults should exercise for at least 150 minutes each week. The exercise should increase your heart rate and make you sweat (moderate-intensity exercise). ? Most adults should also do strengthening exercises at least twice a week. This is in addition to the moderate-intensity exercise.  Maintain a healthy  weight  Body mass index (BMI) is a measurement that can be used to identify possible weight problems. It estimates body fat based on height and weight. Your health care provider can help determine your BMI and help you achieve or maintain a healthy weight.  For females 70 years of age and older: ? A BMI below 18.5 is considered underweight. ? A BMI of 18.5 to 24.9 is normal. ? A BMI of 25 to 29.9 is considered overweight. ? A BMI of 30 and above is considered obese.  Watch levels of cholesterol and blood lipids  You should start having your blood tested for lipids and cholesterol at 64 years of age, then have this test every 5 years.  You may need to have your cholesterol levels checked more often if: ? Your lipid or cholesterol levels are high. ? You are older than 64 years of age. ? You are at high risk for heart disease.  Cancer screening Lung Cancer  Lung cancer screening is recommended for adults 47-104 years old who are at high risk for lung cancer because of a history of smoking.  A yearly low-dose CT scan of the lungs is recommended for people who: ? Currently smoke. ? Have quit within the past 15 years. ? Have at least a 30-pack-year history of smoking. A pack year is smoking an average of one pack of cigarettes a day for 1 year.  Yearly screening should continue until it has been 15 years since you quit.  Yearly screening should stop if you develop a health problem that would prevent you from  having lung cancer treatment.  Breast Cancer  Practice breast self-awareness. This means understanding how your breasts normally appear and feel.  It also means doing regular breast self-exams. Let your health care provider know about any changes, no matter how small.  If you are in your 20s or 30s, you should have a clinical breast exam (CBE) by a health care provider every 1-3 years as part of a regular health exam.  If you are 12 or older, have a CBE every year. Also consider  having a breast X-ray (mammogram) every year.  If you have a family history of breast cancer, talk to your health care provider about genetic screening.  If you are at high risk for breast cancer, talk to your health care provider about having an MRI and a mammogram every year.  Breast cancer gene (BRCA) assessment is recommended for women who have family members with BRCA-related cancers. BRCA-related cancers include: ? Breast. ? Ovarian. ? Tubal. ? Peritoneal cancers.  Results of the assessment will determine the need for genetic counseling and BRCA1 and BRCA2 testing.  Cervical Cancer Your health care provider may recommend that you be screened regularly for cancer of the pelvic organs (ovaries, uterus, and vagina). This screening involves a pelvic examination, including checking for microscopic changes to the surface of your cervix (Pap test). You may be encouraged to have this screening done every 3 years, beginning at age 42.  For women ages 79-65, health care providers may recommend pelvic exams and Pap testing every 3 years, or they may recommend the Pap and pelvic exam, combined with testing for human papilloma virus (HPV), every 5 years. Some types of HPV increase your risk of cervical cancer. Testing for HPV may also be done on women of any age with unclear Pap test results.  Other health care providers may not recommend any screening for nonpregnant women who are considered low risk for pelvic cancer and who do not have symptoms. Ask your health care provider if a screening pelvic exam is right for you.  If you have had past treatment for cervical cancer or a condition that could lead to cancer, you need Pap tests and screening for cancer for at least 20 years after your treatment. If Pap tests have been discontinued, your risk factors (such as having a new sexual partner) need to be reassessed to determine if screening should resume. Some women have medical problems that increase  the chance of getting cervical cancer. In these cases, your health care provider may recommend more frequent screening and Pap tests.  Colorectal Cancer  This type of cancer can be detected and often prevented.  Routine colorectal cancer screening usually begins at 64 years of age and continues through 64 years of age.  Your health care provider may recommend screening at an earlier age if you have risk factors for colon cancer.  Your health care provider may also recommend using home test kits to check for hidden blood in the stool.  A small camera at the end of a tube can be used to examine your colon directly (sigmoidoscopy or colonoscopy). This is done to check for the earliest forms of colorectal cancer.  Routine screening usually begins at age 58.  Direct examination of the colon should be repeated every 5-10 years through 64 years of age. However, you may need to be screened more often if early forms of precancerous polyps or small growths are found.  Skin Cancer  Check your skin from head  to toe regularly.  Tell your health care provider about any new moles or changes in moles, especially if there is a change in a mole's shape or color.  Also tell your health care provider if you have a mole that is larger than the size of a pencil eraser.  Always use sunscreen. Apply sunscreen liberally and repeatedly throughout the day.  Protect yourself by wearing long sleeves, pants, a wide-brimmed hat, and sunglasses whenever you are outside.  Heart disease, diabetes, and high blood pressure  High blood pressure causes heart disease and increases the risk of stroke. High blood pressure is more likely to develop in: ? People who have blood pressure in the high end of the normal range (130-139/85-89 mm Hg). ? People who are overweight or obese. ? People who are African American.  If you are 12-80 years of age, have your blood pressure checked every 3-5 years. If you are 74 years of age  or older, have your blood pressure checked every year. You should have your blood pressure measured twice-once when you are at a hospital or clinic, and once when you are not at a hospital or clinic. Record the average of the two measurements. To check your blood pressure when you are not at a hospital or clinic, you can use: ? An automated blood pressure machine at a pharmacy. ? A home blood pressure monitor.  If you are between 6 years and 39 years old, ask your health care provider if you should take aspirin to prevent strokes.  Have regular diabetes screenings. This involves taking a blood sample to check your fasting blood sugar level. ? If you are at a normal weight and have a low risk for diabetes, have this test once every three years after 64 years of age. ? If you are overweight and have a high risk for diabetes, consider being tested at a younger age or more often. Preventing infection Hepatitis B  If you have a higher risk for hepatitis B, you should be screened for this virus. You are considered at high risk for hepatitis B if: ? You were born in a country where hepatitis B is common. Ask your health care provider which countries are considered high risk. ? Your parents were born in a high-risk country, and you have not been immunized against hepatitis B (hepatitis B vaccine). ? You have HIV or AIDS. ? You use needles to inject street drugs. ? You live with someone who has hepatitis B. ? You have had sex with someone who has hepatitis B. ? You get hemodialysis treatment. ? You take certain medicines for conditions, including cancer, organ transplantation, and autoimmune conditions.  Hepatitis C  Blood testing is recommended for: ? Everyone born from [birth year] through 1967. ? Anyone with known risk factors for hepatitis C.  Sexually transmitted infections (STIs)  You should be screened for sexually transmitted infections (STIs) including gonorrhea and chlamydia if: ? You are  sexually active and are younger than 64 years of age. ? You are older than 64 years of age and your health care provider tells you that you are at risk for this type of infection. ? Your sexual activity has changed since you were last screened and you are at an increased risk for chlamydia or gonorrhea. Ask your health care provider if you are at risk.  If you do not have HIV, but are at risk, it may be recommended that you take a prescription medicine daily to prevent HIV  infection. This is called pre-exposure prophylaxis (PrEP). You are considered at risk if: ? You are sexually active and do not regularly use condoms or know the HIV status of your partner(s). ? You take drugs by injection. ? You are sexually active with a partner who has HIV.  Talk with your health care provider about whether you are at high risk of being infected with HIV. If you choose to begin PrEP, you should first be tested for HIV. You should then be tested every 3 months for as long as you are taking PrEP. Pregnancy  If you are premenopausal and you may become pregnant, ask your health care provider about preconception counseling.  If you may become pregnant, take 400 to 800 micrograms (mcg) of folic acid every day.  If you want to prevent pregnancy, talk to your health care provider about birth control (contraception). Osteoporosis and menopause  Osteoporosis is a disease in which the bones lose minerals and strength with aging. This can result in serious bone fractures. Your risk for osteoporosis can be identified using a bone density scan.  If you are 26 years of age or older, or if you are at risk for osteoporosis and fractures, ask your health care provider if you should be screened.  Ask your health care provider whether you should take a calcium or vitamin D supplement to lower your risk for osteoporosis.  Menopause may have certain physical symptoms and risks.  Hormone replacement therapy may reduce some  of these symptoms and risks. Talk to your health care provider about whether hormone replacement therapy is right for you. Follow these instructions at home:  Schedule regular health, dental, and eye exams.  Stay current with your immunizations.  Do not use any tobacco products including cigarettes, chewing tobacco, or electronic cigarettes.  If you are pregnant, do not drink alcohol.  If you are breastfeeding, limit how much and how often you drink alcohol.  Limit alcohol intake to no more than 1 drink per day for nonpregnant women. One drink equals 12 ounces of beer, 5 ounces of wine, or 1 ounces of hard liquor.  Do not use street drugs.  Do not share needles.  Ask your health care provider for help if you need support or information about quitting drugs.  Tell your health care provider if you often feel depressed.  Tell your health care provider if you have ever been abused or do not feel safe at home. This information is not intended to replace advice given to you by your health care provider. Make sure you discuss any questions you have with your health care provider. Document Released: 03/04/2011 Document Revised: 01/25/2016 Document Reviewed: 05/23/2015 Elsevier Interactive Patient Education  [employer].

## 2018-02-02 ENCOUNTER — Encounter: Payer: 59 | Admitting: Internal Medicine

## 2018-02-06 ENCOUNTER — Other Ambulatory Visit: Payer: Self-pay | Admitting: Internal Medicine

## 2018-03-18 DIAGNOSIS — D485 Neoplasm of uncertain behavior of skin: Secondary | ICD-10-CM | POA: Diagnosis not present

## 2018-03-18 DIAGNOSIS — D2261 Melanocytic nevi of right upper limb, including shoulder: Secondary | ICD-10-CM | POA: Diagnosis not present

## 2018-03-18 DIAGNOSIS — L720 Epidermal cyst: Secondary | ICD-10-CM | POA: Diagnosis not present

## 2018-03-18 DIAGNOSIS — D225 Melanocytic nevi of trunk: Secondary | ICD-10-CM | POA: Diagnosis not present

## 2018-03-19 ENCOUNTER — Encounter: Payer: Self-pay | Admitting: Internal Medicine

## 2018-03-19 ENCOUNTER — Other Ambulatory Visit: Payer: Self-pay | Admitting: Internal Medicine

## 2018-03-19 ENCOUNTER — Encounter: Payer: Self-pay | Admitting: Gastroenterology

## 2018-03-19 DIAGNOSIS — R739 Hyperglycemia, unspecified: Secondary | ICD-10-CM

## 2018-03-19 DIAGNOSIS — E039 Hypothyroidism, unspecified: Secondary | ICD-10-CM

## 2018-03-19 DIAGNOSIS — Z0000 Encounter for general adult medical examination without abnormal findings: Secondary | ICD-10-CM

## 2018-03-19 DIAGNOSIS — E785 Hyperlipidemia, unspecified: Secondary | ICD-10-CM

## 2018-03-19 DIAGNOSIS — R768 Other specified abnormal immunological findings in serum: Secondary | ICD-10-CM

## 2018-03-19 NOTE — Telephone Encounter (Signed)
I ordered an autoimmune test.  Will the lab do the June blood work and the lab I ordered today or just the most recent?   Let pt know labs are ordered.

## 2018-03-19 NOTE — Telephone Encounter (Signed)
Labs are pending from cancelled appt in June. Im not sure what auto immune blood work she is wanting.

## 2018-03-20 ENCOUNTER — Other Ambulatory Visit (INDEPENDENT_AMBULATORY_CARE_PROVIDER_SITE_OTHER): Payer: 59

## 2018-03-20 DIAGNOSIS — E785 Hyperlipidemia, unspecified: Secondary | ICD-10-CM

## 2018-03-20 DIAGNOSIS — R739 Hyperglycemia, unspecified: Secondary | ICD-10-CM

## 2018-03-20 DIAGNOSIS — R768 Other specified abnormal immunological findings in serum: Secondary | ICD-10-CM

## 2018-03-20 DIAGNOSIS — Z0000 Encounter for general adult medical examination without abnormal findings: Secondary | ICD-10-CM | POA: Diagnosis not present

## 2018-03-20 DIAGNOSIS — E039 Hypothyroidism, unspecified: Secondary | ICD-10-CM | POA: Diagnosis not present

## 2018-03-20 LAB — LIPID PANEL
Cholesterol: 218 mg/dL — ABNORMAL HIGH (ref 0–200)
HDL: 45.5 mg/dL (ref >39.00)
LDL Cholesterol: 149 mg/dL — ABNORMAL HIGH (ref 0–99)
NONHDL: 172.19
Total CHOL/HDL Ratio: 5
Triglycerides: 117 mg/dL (ref 0.0–149.0)
VLDL: 23.4 mg/dL (ref 0.0–40.0)

## 2018-03-20 LAB — CBC WITH DIFFERENTIAL/PLATELET
Basophils Absolute: 0.1 10*3/uL (ref 0.0–0.1)
Basophils Relative: 1.4 % (ref 0.0–3.0)
EOS PCT: 2.9 % (ref 0.0–5.0)
Eosinophils Absolute: 0.2 10*3/uL (ref 0.0–0.7)
HEMATOCRIT: 43.8 % (ref 36.0–46.0)
HEMOGLOBIN: 14.9 g/dL (ref 12.0–15.0)
Lymphocytes Relative: 27.1 % (ref 12.0–46.0)
Lymphs Abs: 1.6 10*3/uL (ref 0.7–4.0)
MCHC: 34.1 g/dL (ref 30.0–36.0)
MCV: 86 fl (ref 78.0–100.0)
MONOS PCT: 10.5 % (ref 3.0–12.0)
Monocytes Absolute: 0.6 10*3/uL (ref 0.1–1.0)
Neutro Abs: 3.5 10*3/uL (ref 1.4–7.7)
Neutrophils Relative %: 58.1 % (ref 43.0–77.0)
Platelets: 208 10*3/uL (ref 150.0–400.0)
RBC: 5.09 Mil/uL (ref 3.87–5.11)
RDW: 13.5 % (ref 11.5–15.5)
WBC: 6 10*3/uL (ref 4.0–10.5)

## 2018-03-20 LAB — COMPREHENSIVE METABOLIC PANEL
ALK PHOS: 85 U/L (ref 39–117)
ALT: 15 U/L (ref 0–35)
AST: 12 U/L (ref 0–37)
Albumin: 4.1 g/dL (ref 3.5–5.2)
BUN: 16 mg/dL (ref 6–23)
CO2: 29 mEq/L (ref 19–32)
Calcium: 9.7 mg/dL (ref 8.4–10.5)
Chloride: 105 mEq/L (ref 96–112)
Creatinine, Ser: 0.78 mg/dL (ref 0.40–1.20)
GFR: 78.91 mL/min (ref >60.00)
GLUCOSE: 100 mg/dL — AB (ref 70–99)
POTASSIUM: 4.2 meq/L (ref 3.5–5.1)
Sodium: 141 mEq/L (ref 135–145)
TOTAL PROTEIN: 7.1 g/dL (ref 6.0–8.3)
Total Bilirubin: 0.8 mg/dL (ref 0.2–1.2)

## 2018-03-20 LAB — HEMOGLOBIN A1C: HEMOGLOBIN A1C: 5.6 % (ref 4.6–6.5)

## 2018-03-20 LAB — TSH: TSH: 3.98 u[IU]/mL (ref 0.35–4.50)

## 2018-03-23 LAB — ANTI-NUCLEAR AB-TITER (ANA TITER)

## 2018-03-23 LAB — ANA: Anti Nuclear Antibody(ANA): POSITIVE — AB

## 2018-03-31 NOTE — Progress Notes (Signed)
Subjective:    Patient ID: Lindsey Ferrell, female    DOB: 07/07/1956, 62 y.o.   MRN: [id number]  HPI     Medications and allergies reviewed with patient and updated if appropriate.  Patient Active Problem List   Diagnosis Date Noted  . Hyperglycemia 12/09/2017  . Other microscopic colitis 11/14/2017  . Malignant melanoma of right lower extremity including hip ([place]) 09/16/2017  . Anxiety 08/23/2017  . Positive ANA (antinuclear antibody) 01/11/2016  . Vitamin D deficiency 01/08/2016  . IBS (irritable bowel syndrome) 05/03/2015  . 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 Plantar fasciitis of left foot 01/31/2014  . GERD 05/15/2010  . Hyperlipidemia 06/09/2009  . THYROID NODULE, RIGHT 12/21/2008  . Hypothyroidism 12/21/2008  . SICKLE-CELL TRAIT 12/21/2008  . ALLERGIC RHINITIS 12/21/2008  . ASTHMA 12/21/2008    Current Outpatient Medications on File Prior to Visit  Medication Sig Dispense Refill  . BLACK PEPPER-TURMERIC PO Take by mouth daily.    . Ergocalciferol (VITAMIN D2) 2000 units TABS Take 2,000 Units by mouth daily.    Marland Kitchen levothyroxine (SYNTHROID, LEVOTHROID) 137 MCG tablet TAKE 1 TABLET BY MOUTH DAILY 30 tablet 0  . loperamide (IMODIUM A-D) 2 MG tablet Take 1 tablet (2 mg total) by mouth every morning. Then as needed. 30 tablet 0  . MELATONIN PO Take by mouth at bedtime.    . mometasone (NASONEX) 50 MCG/ACT nasal spray PLACE 2 SPRAYS INTO THE NOSTRILS ONCE DAILY (Patient taking differently: PLACE 2 SPRAYS INTO THE NOSTRILS ONCE DAILY, AS NEEDED) 17 g 2   No current facility-administered medications on file prior to visit.     Past Medical History:  Diagnosis Date  . ALLERGIC RHINITIS, SEASONAL   . Allergy   . Blood transfusion without reported diagnosis    Eptopic prgnancy   . GERD   . HYPERLIPIDEMIA, BORDERLINE   . Kidney stone 07/2013   recurrent  . Lymphocytic colitis   . Melanoma ([place]) 2010   (R) knee area  . Sickle cell anemia ([facility])    pt  reports sickle cell trait  . Sickle-cell trait ([place])   . THYROID NODULE, RIGHT   . Unspecified hypothyroidism     Past Surgical History:  Procedure Laterality Date  . CHOLECYSTECTOMY  04/2005  . COLONOSCOPY  08/05/2012   tics only  . CYSTOSCOPY WITH RETROGRADE PYELOGRAM, URETEROSCOPY AND STENT PLACEMENT Left 07/06/2013   Procedure: CYSTOSCOPY WITH RETROGRADE PYELOGRAM, URETEROSCOPY AND STENT PLACEMENT;  Surgeon: Molli Hazard, MD;  Location: [facility];  Service: Urology;  Laterality: Left;  . CYSTOSCOPY WITH RETROGRADE PYELOGRAM, URETEROSCOPY AND STENT PLACEMENT Left 07/08/2013   Procedure: CYSTOSCOPY WITH LEFT  RETROGRADE PYELOGRAM  AND STENT PLACEMENT;  Surgeon: Ardis Hughs, MD;  Location: [facility];  Service: Urology;  Laterality: Left;  . CYSTOSCOPY WITH RETROGRADE PYELOGRAM, URETEROSCOPY AND STENT PLACEMENT Left 07/16/2013   Procedure: CYSTOSCOPY WITH RETROGRADE PYELOGRAM, URETEROSCOPY AND Stone removal, stent exchange;  Surgeon: Molli Hazard, MD;  Location: [facility];  Service: Urology;  Laterality: Left;  . DILATION AND CURETTAGE OF UTERUS    . Eptopic pregnancy    . ESOPHAGOGASTRODUODENOSCOPY  07/12/2010  . HOLMIUM LASER APPLICATION Left 41/05/6221   Procedure: HOLMIUM LASER APPLICATION;  Surgeon: Molli Hazard, MD;  Location: [facility];  Service: Urology;  Laterality: Left;  Marland Kitchen Melanoma in situ excision  2010   (R) thigh   . NASAL SINUS SURGERY    . TONSILLECTOMY     childhood  .  UPPER GASTROINTESTINAL ENDOSCOPY      Social History   Socioeconomic History  . Marital status: Married    Spouse name: Not on file  . Number of children: Not on file  . Years of education: Not on file  . Highest education level: Not on file  Occupational History  . Not on file  Social Needs  . Financial resource strain: Not on file  . Food insecurity:    Worry: Not on file    Inability: Not on file  . Transportation needs:    Medical: Not on file    Non-medical: Not on  file  Tobacco Use  . Smoking status: Never Smoker  . Smokeless tobacco: Never Used  . Tobacco comment: Married, 2 grown dtr with g-kids plus 2 adopted children, living at home  Substance and Sexual Activity  . Alcohol use: Not Currently    Comment: rare  . Drug use: Never  . Sexual activity: Not on file  Lifestyle  . Physical activity:    Days per week: Not on file    Minutes per session: Not on file  . Stress: Not on file  Relationships  . Social connections:    Talks on phone: Not on file    Gets together: Not on file    Attends religious service: Not on file    Active member of club or organization: Not on file    Attends meetings of clubs or organizations: Not on file    Relationship status: Not on file  Other Topics Concern  . Not on file  Social History Narrative  . Not on file    Family History  Problem Relation Age of Onset  . Arthritis Mother   . Diverticulosis Mother   . Heart disease Mother   . Arthritis Father   . Heart disease Father   . Breast cancer Sister   . Diabetes Maternal Uncle   . Hypertension Other        parent & grandparent  . Heart disease Other        parent & grandparent  . Lung cancer Brother        smoker  . Graves' disease Daughter   . Colon cancer Neg Hx   . Esophageal cancer Neg Hx   . Rectal cancer Neg Hx   . Stomach cancer Neg Hx   . Pancreatic cancer Neg Hx   . Prostate cancer Neg Hx     Review of Systems     Objective:  There were no vitals filed for this visit. There were no vitals filed for this visit. There is no height or weight on file to calculate BMI.  Wt Readings from Last 3 Encounters:  12/09/17 173 lb (78.5 kg)  11/24/17 177 lb (80.3 kg)  10/28/17 175 lb 12.8 oz (79.7 kg)     Physical Exam        Assessment & Plan:          This encounter was created in error - please disregard.

## 2018-04-01 ENCOUNTER — Encounter: Payer: 59 | Admitting: Internal Medicine

## 2018-04-21 ENCOUNTER — Other Ambulatory Visit: Payer: Self-pay | Admitting: Internal Medicine

## 2018-04-26 NOTE — Progress Notes (Signed)
Subjective:    Patient ID: Lindsey Ferrell, female    DOB: 04-06-1956, 62 y.o.   MRN: [id number]  HPI She is here for a physical exam.   She was been working with a trainer and started using more weights. .  She started getting knots under her muscles.  They would go away with massage but came back with doing the exercises again.  She plans on stopping the trainer and doing yoga.  She has lost weight.    IBS is stable - she takes 2 immodium in the morning and that  keeps her IBS controlled.   Hair is thinning.  She has red blotches in her scalp.  She denies itching and dryness in the scalp.  She sees dermatology, but has not discussed this with her.    Medications and allergies reviewed with patient and updated if appropriate.  Patient Active Problem List   Diagnosis Date Noted  . Hyperglycemia 12/09/2017  . Other microscopic colitis 11/14/2017  . Malignant melanoma of right lower extremity including hip ([place]) 09/16/2017  . Anxiety 08/23/2017  . Positive ANA (antinuclear antibody) 01/11/2016  . Vitamin D deficiency 01/08/2016  . IBS (irritable bowel syndrome) 05/03/2015  . 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 Plantar fasciitis of left foot 01/31/2014  . GERD 05/15/2010  . Hyperlipidemia 06/09/2009  . THYROID NODULE, RIGHT 12/21/2008  . Hypothyroidism 12/21/2008  . SICKLE-CELL TRAIT 12/21/2008  . ALLERGIC RHINITIS 12/21/2008  . ASTHMA 12/21/2008    Current Outpatient Medications on File Prior to Visit  Medication Sig Dispense Refill  . BLACK PEPPER-TURMERIC PO Take by mouth daily.    . Ergocalciferol (VITAMIN D2) 2000 units TABS Take 2,000 Units by mouth daily.    Marland Kitchen levothyroxine (SYNTHROID, LEVOTHROID) 137 MCG tablet TAKE 1 TABLET BY MOUTH DAILY 30 tablet 0  . loperamide (IMODIUM A-D) 2 MG tablet Take 1 tablet (2 mg total) by mouth every morning. Then as needed. 30 tablet 0  . MELATONIN PO Take by mouth at bedtime.    . mometasone (NASONEX) 50 MCG/ACT nasal spray  PLACE 2 SPRAYS INTO THE NOSTRILS ONCE DAILY (Patient taking differently: PLACE 2 SPRAYS INTO THE NOSTRILS ONCE DAILY, AS NEEDED) 17 g 2   No current facility-administered medications on file prior to visit.     Past Medical History:  Diagnosis Date  . ALLERGIC RHINITIS, SEASONAL   . Allergy   . Blood transfusion without reported diagnosis    Eptopic prgnancy   . GERD   . HYPERLIPIDEMIA, BORDERLINE   . Kidney stone 07/2013   recurrent  . Lymphocytic colitis   . Melanoma ([place]) 2010   (R) knee area  . Sickle cell anemia ([facility])    pt reports sickle cell trait  . Sickle-cell trait ([place])   . THYROID NODULE, RIGHT   . Unspecified hypothyroidism     Past Surgical History:  Procedure Laterality Date  . CHOLECYSTECTOMY  04/2005  . COLONOSCOPY  08/05/2012   tics only  . CYSTOSCOPY WITH RETROGRADE PYELOGRAM, URETEROSCOPY AND STENT PLACEMENT Left 07/06/2013   Procedure: CYSTOSCOPY WITH RETROGRADE PYELOGRAM, URETEROSCOPY AND STENT PLACEMENT;  Surgeon: Molli Hazard, MD;  Location: [facility];  Service: Urology;  Laterality: Left;  . CYSTOSCOPY WITH RETROGRADE PYELOGRAM, URETEROSCOPY AND STENT PLACEMENT Left 07/08/2013   Procedure: CYSTOSCOPY WITH LEFT  RETROGRADE PYELOGRAM  AND STENT PLACEMENT;  Surgeon: Ardis Hughs, MD;  Location: [facility];  Service: Urology;  Laterality: Left;  . CYSTOSCOPY WITH RETROGRADE PYELOGRAM, URETEROSCOPY  AND STENT PLACEMENT Left 07/16/2013   Procedure: CYSTOSCOPY WITH RETROGRADE PYELOGRAM, URETEROSCOPY AND Stone removal, stent exchange;  Surgeon: Molli Hazard, MD;  Location: [facility];  Service: Urology;  Laterality: Left;  . DILATION AND CURETTAGE OF UTERUS    . Eptopic pregnancy    . ESOPHAGOGASTRODUODENOSCOPY  07/12/2010  . HOLMIUM LASER APPLICATION Left 70/10/6376   Procedure: HOLMIUM LASER APPLICATION;  Surgeon: Molli Hazard, MD;  Location: [facility];  Service: Urology;  Laterality: Left;  Marland Kitchen Melanoma in situ excision  2010   (R) thigh   .  NASAL SINUS SURGERY    . TONSILLECTOMY     childhood  . UPPER GASTROINTESTINAL ENDOSCOPY      Social History   Socioeconomic History  . Marital status: Married    Spouse name: Not on file  . Number of children: Not on file  . Years of education: Not on file  . Highest education level: Not on file  Occupational History  . Not on file  Social Needs  . Financial resource strain: Not on file  . Food insecurity:    Worry: Not on file    Inability: Not on file  . Transportation needs:    Medical: Not on file    Non-medical: Not on file  Tobacco Use  . Smoking status: Never Smoker  . Smokeless tobacco: Never Used  . Tobacco comment: Married, 2 grown dtr with g-kids plus 2 adopted children, living at home  Substance and Sexual Activity  . Alcohol use: Not Currently    Comment: rare  . Drug use: Never  . Sexual activity: Not on file  Lifestyle  . Physical activity:    Days per week: Not on file    Minutes per session: Not on file  . Stress: Not on file  Relationships  . Social connections:    Talks on phone: Not on file    Gets together: Not on file    Attends religious service: Not on file    Active member of club or organization: Not on file    Attends meetings of clubs or organizations: Not on file    Relationship status: Not on file  Other Topics Concern  . Not on file  Social History Narrative  . Not on file    Family History  Problem Relation Age of Onset  . Arthritis Mother   . Diverticulosis Mother   . Heart disease Mother   . Arthritis Father   . Heart disease Father   . Breast cancer Sister   . Diabetes Maternal Uncle   . Hypertension Other        parent & grandparent  . Heart disease Other        parent & grandparent  . Lung cancer Brother        smoker  . Graves' disease Daughter   . Colon cancer Neg Hx   . Esophageal cancer Neg Hx   . Rectal cancer Neg Hx   . Stomach cancer Neg Hx   . Pancreatic cancer Neg Hx   . Prostate cancer Neg Hx      Review of Systems  Constitutional: Negative for chills and fever.  Eyes: Negative for visual disturbance.  Respiratory: Negative for cough, shortness of breath and wheezing.   Cardiovascular: Positive for palpitations (occ). Negative for chest pain and leg swelling.  Gastrointestinal: Positive for abdominal pain and diarrhea (chronic, controlled). Negative for blood in stool and nausea.  Genitourinary: Negative for dysuria and hematuria.  Musculoskeletal: Negative for arthralgias and back pain.  Skin: Negative for color change and rash.  Neurological: Negative for light-headedness and headaches.  Psychiatric/Behavioral: Negative for dysphoric mood. The patient is not nervous/anxious.        Objective:   Vitals:   04/27/18 1356  BP: 120/78  Pulse: 72  Resp: 16  Temp: 98.8 F (37.1 C)  SpO2: 98%   Filed Weights   04/27/18 1356  Weight: 164 lb (74.4 kg)   Body mass index is 27.29 kg/m.  Wt Readings from Last 3 Encounters:  04/27/18 164 lb (74.4 kg)  12/09/17 173 lb (78.5 kg)  11/24/17 177 lb (80.3 kg)     Physical Exam Constitutional: She appears well-developed and well-nourished. No distress.  HENT:  Head: Normocephalic and atraumatic.  Right Ear: External ear normal. Normal ear canal and TM Left Ear: External ear normal.  Normal ear canal and TM Mouth/Throat: Oropharynx is clear and moist.  Eyes: Conjunctivae and EOM are normal.  Neck: Neck supple. No tracheal deviation present. No thyromegaly present.  No carotid bruit  Cardiovascular: Normal rate, regular rhythm and normal heart sounds.   No murmur heard.  No edema. Pulmonary/Chest: Effort normal and breath sounds normal. No respiratory distress. She has no wheezes. She has no rales.  Breast: deferred to Gyn Abdominal: Soft. She exhibits no distension. There is no tenderness.  Lymphadenopathy: She has no cervical adenopathy.  Skin: Skin is warm and dry. She is not diaphoretic.  Psychiatric: She has a  normal mood and affect. Her behavior is normal.        Assessment & Plan:   Physical exam: Screening blood work   reviewed Immunizations     Discussed in shingrix,  Colonoscopy    Up to date  Mammogram    Up to date  Gyn   Up to date  Eye exams   Up to date  EKG    Done 07/2016 Exercise  Working with a Clinical research associate - plans on starting yoga instead Weight  Overweight - has lost weight and working on weight loss Skin   no concerns except scalp, sees derm Substance abuse   none  See Problem List for Assessment and Plan of chronic medical problems.   FU in one year

## 2018-04-26 NOTE — Patient Instructions (Addendum)
Reviewed your blood work.  All other Health Maintenance issues reviewed.   All recommended immunizations and age-appropriate screenings are up-to-date or discussed.  No immunizations administered today.   Medications reviewed and updated.  No changes recommended at this time.  Your prescription(s) have been submitted to your pharmacy. Please take as directed and contact our office if you believe you are having problem(s) with the medication(s).   Please followup in one year   Health Maintenance, Female Adopting a healthy lifestyle and getting preventive care can go a long way to promote health and wellness. Talk with your health care provider about what schedule of regular examinations is right for you. This is a good chance for you to check in with your provider about disease prevention and staying healthy. In between checkups, there are plenty of things you can do on your own. Experts have done a lot of research about which lifestyle changes and preventive measures are most likely to keep you healthy. Ask your health care provider for more information. Weight and diet Eat a healthy diet  Be sure to include plenty of vegetables, fruits, low-fat dairy products, and lean protein.  Do not eat a lot of foods high in solid fats, added sugars, or salt.  Get regular exercise. This is one of the most important things you can do for your health. ? Most adults should exercise for at least 150 minutes each week. The exercise should increase your heart rate and make you sweat (moderate-intensity exercise). ? Most adults should also do strengthening exercises at least twice a week. This is in addition to the moderate-intensity exercise.  Maintain a healthy weight  Body mass index (BMI) is a measurement that can be used to identify possible weight problems. It estimates body fat based on height and weight. Your health care provider can help determine your BMI and help you achieve or maintain a  healthy weight.  For females 62 years of age and older: ? A BMI below 18.5 is considered underweight. ? A BMI of 18.5 to 24.9 is normal. ? A BMI of 25 to 29.9 is considered overweight. ? A BMI of 30 and above is considered obese.  Watch levels of cholesterol and blood lipids  You should start having your blood tested for lipids and cholesterol at 64 years of age, then have this test every 5 years.  You may need to have your cholesterol levels checked more often if: ? Your lipid or cholesterol levels are high. ? You are older than 64 years of age. ? You are at high risk for heart disease.  Cancer screening Lung Cancer  Lung cancer screening is recommended for adults 68-37 years old who are at high risk for lung cancer because of a history of smoking.  A yearly low-dose CT scan of the lungs is recommended for people who: ? Currently smoke. ? Have quit within the past 15 years. ? Have at least a 30-pack-year history of smoking. A pack year is smoking an average of one pack of cigarettes a day for 1 year.  Yearly screening should continue until it has been 15 years since you quit.  Yearly screening should stop if you develop a health problem that would prevent you from having lung cancer treatment.  Breast Cancer  Practice breast self-awareness. This means understanding how your breasts normally appear and feel.  It also means doing regular breast self-exams. Let your health care provider know about any changes, no matter how small.  If  you are in your 20s or 30s, you should have a clinical breast exam (CBE) by a health care provider every 1-3 years as part of a regular health exam.  If you are 97 or older, have a CBE every year. Also consider having a breast X-ray (mammogram) every year.  If you have a family history of breast cancer, talk to your health care provider about genetic screening.  If you are at high risk for breast cancer, talk to your health care provider about  having an MRI and a mammogram every year.  Breast cancer gene (BRCA) assessment is recommended for women who have family members with BRCA-related cancers. BRCA-related cancers include: ? Breast. ? Ovarian. ? Tubal. ? Peritoneal cancers.  Results of the assessment will determine the need for genetic counseling and BRCA1 and BRCA2 testing.  Cervical Cancer Your health care provider may recommend that you be screened regularly for cancer of the pelvic organs (ovaries, uterus, and vagina). This screening involves a pelvic examination, including checking for microscopic changes to the surface of your cervix (Pap test). You may be encouraged to have this screening done every 3 years, beginning at age 74.  For women ages 61-65, health care providers may recommend pelvic exams and Pap testing every 3 years, or they may recommend the Pap and pelvic exam, combined with testing for human papilloma virus (HPV), every 5 years. Some types of HPV increase your risk of cervical cancer. Testing for HPV may also be done on women of any age with unclear Pap test results.  Other health care providers may not recommend any screening for nonpregnant women who are considered low risk for pelvic cancer and who do not have symptoms. Ask your health care provider if a screening pelvic exam is right for you.  If you have had past treatment for cervical cancer or a condition that could lead to cancer, you need Pap tests and screening for cancer for at least 20 years after your treatment. If Pap tests have been discontinued, your risk factors (such as having a new sexual partner) need to be reassessed to determine if screening should resume. Some women have medical problems that increase the chance of getting cervical cancer. In these cases, your health care provider may recommend more frequent screening and Pap tests.  Colorectal Cancer  This type of cancer can be detected and often prevented.  Routine colorectal cancer  screening usually begins at 64 years of age and continues through 64 years of age.  Your health care provider may recommend screening at an earlier age if you have risk factors for colon cancer.  Your health care provider may also recommend using home test kits to check for hidden blood in the stool.  A small camera at the end of a tube can be used to examine your colon directly (sigmoidoscopy or colonoscopy). This is done to check for the earliest forms of colorectal cancer.  Routine screening usually begins at age 37.  Direct examination of the colon should be repeated every 5-10 years through 64 years of age. However, you may need to be screened more often if early forms of precancerous polyps or small growths are found.  Skin Cancer  Check your skin from head to toe regularly.  Tell your health care provider about any new moles or changes in moles, especially if there is a change in a mole's shape or color.  Also tell your health care provider if you have a mole that is larger  than the size of a pencil eraser.  Always use sunscreen. Apply sunscreen liberally and repeatedly throughout the day.  Protect yourself by wearing long sleeves, pants, a wide-brimmed hat, and sunglasses whenever you are outside.  Heart disease, diabetes, and high blood pressure  High blood pressure causes heart disease and increases the risk of stroke. High blood pressure is more likely to develop in: ? People who have blood pressure in the high end of the normal range (130-139/85-89 mm Hg). ? People who are overweight or obese. ? People who are African American.  If you are 56-17 years of age, have your blood pressure checked every 3-5 years. If you are 60 years of age or older, have your blood pressure checked every year. You should have your blood pressure measured twice-once when you are at a hospital or clinic, and once when you are not at a hospital or clinic. Record the average of the two measurements.  To check your blood pressure when you are not at a hospital or clinic, you can use: ? An automated blood pressure machine at a pharmacy. ? A home blood pressure monitor.  If you are between 71 years and 28 years old, ask your health care provider if you should take aspirin to prevent strokes.  Have regular diabetes screenings. This involves taking a blood sample to check your fasting blood sugar level. ? If you are at a normal weight and have a low risk for diabetes, have this test once every three years after 64 years of age. ? If you are overweight and have a high risk for diabetes, consider being tested at a younger age or more often. Preventing infection Hepatitis B  If you have a higher risk for hepatitis B, you should be screened for this virus. You are considered at high risk for hepatitis B if: ? You were born in a country where hepatitis B is common. Ask your health care provider which countries are considered high risk. ? Your parents were born in a high-risk country, and you have not been immunized against hepatitis B (hepatitis B vaccine). ? You have HIV or AIDS. ? You use needles to inject street drugs. ? You live with someone who has hepatitis B. ? You have had sex with someone who has hepatitis B. ? You get hemodialysis treatment. ? You take certain medicines for conditions, including cancer, organ transplantation, and autoimmune conditions.  Hepatitis C  Blood testing is recommended for: ? Everyone born from [birth year] through 1967. ? Anyone with known risk factors for hepatitis C.  Sexually transmitted infections (STIs)  You should be screened for sexually transmitted infections (STIs) including gonorrhea and chlamydia if: ? You are sexually active and are younger than 64 years of age. ? You are older than 64 years of age and your health care provider tells you that you are at risk for this type of infection. ? Your sexual activity has changed since you were last screened  and you are at an increased risk for chlamydia or gonorrhea. Ask your health care provider if you are at risk.  If you do not have HIV, but are at risk, it may be recommended that you take a prescription medicine daily to prevent HIV infection. This is called pre-exposure prophylaxis (PrEP). You are considered at risk if: ? You are sexually active and do not regularly use condoms or know the HIV status of your partner(s). ? You take drugs by injection. ? You are sexually active with  a partner who has HIV.  Talk with your health care provider about whether you are at high risk of being infected with HIV. If you choose to begin PrEP, you should first be tested for HIV. You should then be tested every 3 months for as long as you are taking PrEP. Pregnancy  If you are premenopausal and you may become pregnant, ask your health care provider about preconception counseling.  If you may become pregnant, take 400 to 800 micrograms (mcg) of folic acid every day.  If you want to prevent pregnancy, talk to your health care provider about birth control (contraception). Osteoporosis and menopause  Osteoporosis is a disease in which the bones lose minerals and strength with aging. This can result in serious bone fractures. Your risk for osteoporosis can be identified using a bone density scan.  If you are 25 years of age or older, or if you are at risk for osteoporosis and fractures, ask your health care provider if you should be screened.  Ask your health care provider whether you should take a calcium or vitamin D supplement to lower your risk for osteoporosis.  Menopause may have certain physical symptoms and risks.  Hormone replacement therapy may reduce some of these symptoms and risks. Talk to your health care provider about whether hormone replacement therapy is right for you. Follow these instructions at home:  Schedule regular health, dental, and eye exams.  Stay current with your  immunizations.  Do not use any tobacco products including cigarettes, chewing tobacco, or electronic cigarettes.  If you are pregnant, do not drink alcohol.  If you are breastfeeding, limit how much and how often you drink alcohol.  Limit alcohol intake to no more than 1 drink per day for nonpregnant women. One drink equals 12 ounces of beer, 5 ounces of wine, or 1 ounces of hard liquor.  Do not use street drugs.  Do not share needles.  Ask your health care provider for help if you need support or information about quitting drugs.  Tell your health care provider if you often feel depressed.  Tell your health care provider if you have ever been abused or do not feel safe at home. This information is not intended to replace advice given to you by your health care provider. Make sure you discuss any questions you have with your health care provider. Document Released: 03/04/2011 Document Revised: 01/25/2016 Document Reviewed: 05/23/2015 Elsevier Interactive Patient Education  [employer].

## 2018-04-27 ENCOUNTER — Other Ambulatory Visit (INDEPENDENT_AMBULATORY_CARE_PROVIDER_SITE_OTHER): Payer: 59

## 2018-04-27 ENCOUNTER — Encounter: Payer: Self-pay | Admitting: Internal Medicine

## 2018-04-27 ENCOUNTER — Ambulatory Visit (INDEPENDENT_AMBULATORY_CARE_PROVIDER_SITE_OTHER): Payer: 59 | Admitting: Internal Medicine

## 2018-04-27 VITALS — BP 120/78 | HR 72 | Temp 98.8°F | Resp 16 | Ht 65.0 in | Wt 164.0 lb

## 2018-04-27 DIAGNOSIS — R739 Hyperglycemia, unspecified: Secondary | ICD-10-CM | POA: Diagnosis not present

## 2018-04-27 DIAGNOSIS — E538 Deficiency of other specified B group vitamins: Secondary | ICD-10-CM | POA: Insufficient documentation

## 2018-04-27 DIAGNOSIS — E039 Hypothyroidism, unspecified: Secondary | ICD-10-CM | POA: Diagnosis not present

## 2018-04-27 DIAGNOSIS — Z0000 Encounter for general adult medical examination without abnormal findings: Secondary | ICD-10-CM | POA: Diagnosis not present

## 2018-04-27 DIAGNOSIS — K580 Irritable bowel syndrome with diarrhea: Secondary | ICD-10-CM | POA: Diagnosis not present

## 2018-04-27 DIAGNOSIS — E559 Vitamin D deficiency, unspecified: Secondary | ICD-10-CM

## 2018-04-27 LAB — VITAMIN D 25 HYDROXY (VIT D DEFICIENCY, FRACTURES): VITD: 33.11 ng/mL (ref 30.00–100.00)

## 2018-04-27 LAB — VITAMIN B12: Vitamin B-12: 551 pg/mL (ref 211–911)

## 2018-04-27 NOTE — Assessment & Plan Note (Signed)
Taking vitamin D Will check level

## 2018-04-27 NOTE — Assessment & Plan Note (Signed)
Lab Results  Component Value Date   HGBA1C 5.6 03/20/2018   Sugars in normal range Will monitor

## 2018-04-27 NOTE — Assessment & Plan Note (Addendum)
Taking b12 but not daily Will check level

## 2018-04-27 NOTE — Assessment & Plan Note (Signed)
tsh in normal range Continue current dose of medication 

## 2018-04-27 NOTE — Assessment & Plan Note (Signed)
Diarrhea predominant Takes 2 imodium Q morning Overall controlled, but still frequent BM/s

## 2018-04-28 ENCOUNTER — Encounter: Payer: Self-pay | Admitting: Internal Medicine

## 2018-05-01 ENCOUNTER — Ambulatory Visit: Payer: 59

## 2018-05-05 ENCOUNTER — Ambulatory Visit: Payer: 59

## 2018-05-15 NOTE — Progress Notes (Deleted)
Office Visit Note  Patient: Lindsey Ferrell             Date of Birth: 16-Dec-1955           MRN: [id number]             PCP: Pincus Sanes, MD Referring: Pincus Sanes, MD Visit Date: 05/28/2018 Occupation: @GUAROCC @  Subjective:  No chief complaint on file.   History of Present Illness: Lindsey Ferrell is a 62 y.o. female ***   Activities of Daily Living:  Patient reports morning stiffness for *** {minute/hour:19697}.   Patient {ACTIONS;DENIES/REPORTS:21021675::"Denies"} nocturnal pain.  Difficulty dressing/grooming: {ACTIONS;DENIES/REPORTS:21021675::"Denies"} Difficulty climbing stairs: {ACTIONS;DENIES/REPORTS:21021675::"Denies"} Difficulty getting out of chair: {ACTIONS;DENIES/REPORTS:21021675::"Denies"} Difficulty using hands for taps, buttons, cutlery, and/or writing: {ACTIONS;DENIES/REPORTS:21021675::"Denies"}  No Rheumatology ROS completed.   PMFS History:  Patient Active Problem List   Diagnosis Date Noted  . Vitamin B12 deficiency 04/27/2018  . Hyperglycemia 12/09/2017  . Other microscopic colitis 11/14/2017  . Malignant melanoma of right lower extremity including hip (HCC) 09/16/2017  . Anxiety 08/23/2017  . Positive ANA (antinuclear antibody) 01/11/2016  . Vitamin D deficiency 01/08/2016  . IBS (irritable bowel syndrome) 05/03/2015  . 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 Plantar fasciitis of left foot 01/31/2014  . GERD 05/15/2010  . Hyperlipidemia 06/09/2009  . THYROID NODULE, RIGHT 12/21/2008  . Hypothyroidism 12/21/2008  . SICKLE-CELL TRAIT 12/21/2008  . ALLERGIC RHINITIS 12/21/2008  . ASTHMA 12/21/2008    Past Medical History:  Diagnosis Date  . ALLERGIC RHINITIS, SEASONAL   . Allergy   . Blood transfusion without reported diagnosis    Eptopic prgnancy   . GERD   . HYPERLIPIDEMIA, BORDERLINE   . Kidney stone 07/2013   recurrent  . Lymphocytic colitis   . Melanoma (HCC) 2010   (R) knee area  . Sickle cell anemia (HCC)    pt  reports sickle cell trait  . Sickle-cell trait (HCC)   . THYROID NODULE, RIGHT   . Unspecified hypothyroidism     Family History  Problem Relation Age of Onset  . Arthritis Mother   . Diverticulosis Mother   . Heart disease Mother   . Arthritis Father   . Heart disease Father   . Breast cancer Sister   . Diabetes Maternal Uncle   . Hypertension Other        parent & grandparent  . Heart disease Other        parent & grandparent  . Lung cancer Brother        smoker  . Keelyn Monjaras' disease Daughter   . Colon cancer Neg Hx   . Esophageal cancer Neg Hx   . Rectal cancer Neg Hx   . Stomach cancer Neg Hx   . Pancreatic cancer Neg Hx   . Prostate cancer Neg Hx    Past Surgical History:  Procedure Laterality Date  . CHOLECYSTECTOMY  04/2005  . COLONOSCOPY  08/05/2012   tics only  . CYSTOSCOPY WITH RETROGRADE PYELOGRAM, URETEROSCOPY AND STENT PLACEMENT Left 07/06/2013   Procedure: CYSTOSCOPY WITH RETROGRADE PYELOGRAM, URETEROSCOPY AND STENT PLACEMENT;  Surgeon: Milford Cage, MD;  Location: [facility];  Service: Urology;  Laterality: Left;  . CYSTOSCOPY WITH RETROGRADE PYELOGRAM, URETEROSCOPY AND STENT PLACEMENT Left 07/08/2013   Procedure: CYSTOSCOPY WITH LEFT  RETROGRADE PYELOGRAM  AND STENT PLACEMENT;  Surgeon: Crist Fat, MD;  Location: [facility];  Service: Urology;  Laterality: Left;  . CYSTOSCOPY WITH RETROGRADE PYELOGRAM, URETEROSCOPY AND STENT PLACEMENT Left 07/16/2013  Procedure: CYSTOSCOPY WITH RETROGRADE PYELOGRAM, URETEROSCOPY AND Stone removal, stent exchange;  Surgeon: Milford Cage, MD;  Location: [facility];  Service: Urology;  Laterality: Left;  . DILATION AND CURETTAGE OF UTERUS    . Eptopic pregnancy    . ESOPHAGOGASTRODUODENOSCOPY  07/12/2010  . HOLMIUM LASER APPLICATION Left 07/06/2013   Procedure: HOLMIUM LASER APPLICATION;  Surgeon: Milford Cage, MD;  Location: [facility];  Service: Urology;  Laterality: Left;  Marland Kitchen Melanoma in situ excision  2010    (R) thigh   . NASAL SINUS SURGERY    . TONSILLECTOMY     childhood  . UPPER GASTROINTESTINAL ENDOSCOPY     Social History   Social History Narrative  . Not on file    Objective: Vital Signs: LMP  (LMP Unknown)    Physical Exam   Musculoskeletal Exam: ***  CDAI Exam: CDAI Score: Not documented Patient Global Assessment: Not documented; Provider Global Assessment: Not documented Swollen: Not documented; Tender: Not documented Joint Exam   Not documented   There is currently no information documented on the homunculus. Go to the Rheumatology activity and complete the homunculus joint exam.  Investigation: No additional findings.  Imaging: No results found.  Recent Labs: Lab Results  Component Value Date   WBC 6.0 03/20/2018   HGB 14.9 03/20/2018   PLT 208.0 03/20/2018   NA 141 03/20/2018   K 4.2 03/20/2018   CL 105 03/20/2018   CO2 29 03/20/2018   GLUCOSE 100 (H) 03/20/2018   BUN 16 03/20/2018   CREATININE 0.78 03/20/2018   BILITOT 0.8 03/20/2018   ALKPHOS 85 03/20/2018   AST 12 03/20/2018   ALT 15 03/20/2018   PROT 7.1 03/20/2018   ALBUMIN 4.1 03/20/2018   CALCIUM 9.7 03/20/2018   GFRAA 102 09/16/2017    Speciality Comments: No specialty comments available.  Procedures:  No procedures performed Allergies: Statins and Droperidol   Assessment / Plan:     Visit Diagnoses: No diagnosis found.   Orders: No orders of the defined types were placed in this encounter.  No orders of the defined types were placed in this encounter.   Face-to-face time spent with patient was *** minutes. Greater than 50% of time was spent in counseling and coordination of care.  Follow-Up Instructions: No follow-ups on file.   Ellen Henri, CMA  Note - This record has been created using Animal nutritionist.  Chart creation errors have been sought, but may not always  have been located. Such creation errors do not reflect on  the standard of medical care.

## 2018-05-19 DIAGNOSIS — Z6827 Body mass index (BMI) 27.0-27.9, adult: Secondary | ICD-10-CM | POA: Diagnosis not present

## 2018-05-19 DIAGNOSIS — Z801 Family history of malignant neoplasm of trachea, bronchus and lung: Secondary | ICD-10-CM | POA: Diagnosis not present

## 2018-05-19 DIAGNOSIS — Z800 Family history of malignant neoplasm of digestive organs: Secondary | ICD-10-CM | POA: Diagnosis not present

## 2018-05-19 DIAGNOSIS — Z01419 Encounter for gynecological examination (general) (routine) without abnormal findings: Secondary | ICD-10-CM | POA: Diagnosis not present

## 2018-05-19 DIAGNOSIS — Z1231 Encounter for screening mammogram for malignant neoplasm of breast: Secondary | ICD-10-CM | POA: Diagnosis not present

## 2018-05-19 DIAGNOSIS — Z803 Family history of malignant neoplasm of breast: Secondary | ICD-10-CM | POA: Diagnosis not present

## 2018-05-20 ENCOUNTER — Other Ambulatory Visit: Payer: Self-pay | Admitting: Internal Medicine

## 2018-05-28 ENCOUNTER — Ambulatory Visit: Payer: 59 | Admitting: Rheumatology

## 2018-09-08 DIAGNOSIS — L570 Actinic keratosis: Secondary | ICD-10-CM | POA: Diagnosis not present

## 2018-09-08 DIAGNOSIS — L650 Telogen effluvium: Secondary | ICD-10-CM | POA: Diagnosis not present

## 2018-09-08 DIAGNOSIS — L218 Other seborrheic dermatitis: Secondary | ICD-10-CM | POA: Diagnosis not present

## 2018-09-10 ENCOUNTER — Telehealth: Payer: 59 | Admitting: Family

## 2018-09-10 DIAGNOSIS — B9689 Other specified bacterial agents as the cause of diseases classified elsewhere: Secondary | ICD-10-CM | POA: Diagnosis not present

## 2018-09-10 DIAGNOSIS — J329 Chronic sinusitis, unspecified: Secondary | ICD-10-CM

## 2018-09-10 MED ORDER — AMOXICILLIN-POT CLAVULANATE 875-125 MG PO TABS: 1.0000 | ORAL_TABLET | Freq: Two times a day (BID) | ORAL | 0 refills | Status: AC

## 2018-09-10 NOTE — Progress Notes (Signed)

## 2018-09-16 ENCOUNTER — Telehealth: Payer: Self-pay | Admitting: Internal Medicine

## 2018-09-16 MED ORDER — MOMETASONE FUROATE 50 MCG/ACT NA SUSP: NASAL | 2 refills | Status: DC

## 2018-09-16 NOTE — Telephone Encounter (Signed)
Spoke with pt to inform.  

## 2018-09-16 NOTE — Telephone Encounter (Signed)
nasonex sent    zpak is not needed and is not as effective as the augmentin for sinus infections

## 2018-09-16 NOTE — Telephone Encounter (Signed)
Copied from [place] [id number]. Topic: Quick Communication - See Telephone Encounter >> Sep 16, 2018 10:28 AM Rutherford Nail, NT wrote: CRM for notification. See Telephone encounter for: 09/16/18. Patient calling and states she is in [place], [place] and that she had an e-visit on 09/10/2018 and was prescribed amoxicillin-clavulanate (AUGMENTIN) 875-125 MG tablet and has 2 tablets left, but she still has a lot of congestion. States that she is taking the mucinex DM that was suggested with the e-visit. Is wondering is Dr Quay Burow could send in mometasone (NASONEX) 50 MCG/ACT nasal spray for her nasal congestions? She also wonders if she may need a zpack. Please advise.  [place], [place] - [address] AT [facility] [address]#: [phone]

## 2018-10-05 DIAGNOSIS — D225 Melanocytic nevi of trunk: Secondary | ICD-10-CM | POA: Diagnosis not present

## 2018-10-05 DIAGNOSIS — L650 Telogen effluvium: Secondary | ICD-10-CM | POA: Diagnosis not present

## 2018-10-05 DIAGNOSIS — I788 Other diseases of capillaries: Secondary | ICD-10-CM | POA: Diagnosis not present

## 2018-10-05 DIAGNOSIS — L218 Other seborrheic dermatitis: Secondary | ICD-10-CM | POA: Diagnosis not present

## 2018-10-05 DIAGNOSIS — D2261 Melanocytic nevi of right upper limb, including shoulder: Secondary | ICD-10-CM | POA: Diagnosis not present

## 2018-10-05 DIAGNOSIS — L570 Actinic keratosis: Secondary | ICD-10-CM | POA: Diagnosis not present

## 2018-10-05 DIAGNOSIS — D485 Neoplasm of uncertain behavior of skin: Secondary | ICD-10-CM | POA: Diagnosis not present

## 2018-10-05 DIAGNOSIS — C44519 Basal cell carcinoma of skin of other part of trunk: Secondary | ICD-10-CM | POA: Diagnosis not present

## 2018-10-05 DIAGNOSIS — D2272 Melanocytic nevi of left lower limb, including hip: Secondary | ICD-10-CM | POA: Diagnosis not present

## 2018-10-05 DIAGNOSIS — L821 Other seborrheic keratosis: Secondary | ICD-10-CM | POA: Diagnosis not present

## 2018-10-05 DIAGNOSIS — D0461 Carcinoma in situ of skin of right upper limb, including shoulder: Secondary | ICD-10-CM | POA: Diagnosis not present

## 2018-10-13 NOTE — Progress Notes (Signed)
Subjective:    Patient ID: Lindsey Ferrell, female    DOB: 06-01-1956, 63 y.o.   MRN: [id number]  HPI The patient is here for an acute visit.   She took an antibiotic for insulin function that was fairly severe. She has noticed several things.  She has not been able to taste food, but continues weekly since openings and has been creating them more.  She is felt very hungry and is eating more sweets than usual.  As result she has gained weight.  She has noticed increased thirst and an abnormal smell to her urine.  She also has intermittent vision changes with squiggly lines in her vision associated with mild headache.  She is concerned about the possibility of diabetes because she has a family history of diabetes and had elevated sugars in the past.  She developed an ache in her lower back and over the past week and a half she had an ache in her LLQ.  These pains have been intermittent and currently she does not have been she fels like she had more urge to urinate.  It was small amount.      She does have a history of migraines, but has not had them for years.  She is still having some mild sinus infection symptoms.  She does have a history of sinus surgery in the past.   Vitamin D deficiency: She is taking her vitamin D daily as prescribed.  We had increased her dose after last visit because it was still slightly low.  Medications and allergies reviewed with patient and updated if appropriate.  Patient Active Problem List   Diagnosis Date Noted  . Vitamin B12 deficiency 04/27/2018  . Hyperglycemia 12/09/2017  . Other microscopic colitis 11/14/2017  . Malignant melanoma of right lower extremity including hip ([place]) 09/16/2017  . Anxiety 08/23/2017  . Positive ANA (antinuclear antibody) 01/11/2016  . Vitamin D deficiency 01/08/2016  . IBS (irritable bowel syndrome) 05/03/2015  . 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 Plantar fasciitis of left foot 01/31/2014  . GERD 05/15/2010    . Hyperlipidemia 06/09/2009  . THYROID NODULE, RIGHT 12/21/2008  . Hypothyroidism 12/21/2008  . SICKLE-CELL TRAIT 12/21/2008  . ALLERGIC RHINITIS 12/21/2008  . ASTHMA 12/21/2008    Current Outpatient Medications on File Prior to Visit  Medication Sig Dispense Refill  . Ergocalciferol (VITAMIN D2) 2000 units TABS Take 2,000 Units by mouth daily.    Marland Kitchen levothyroxine (SYNTHROID, LEVOTHROID) 137 MCG tablet TAKE 1 TABLET BY MOUTH DAILY 90 tablet 1  . loperamide (IMODIUM A-D) 2 MG tablet Take 1 tablet (2 mg total) by mouth every morning. Then as needed. 30 tablet 0  . MELATONIN PO Take by mouth at bedtime.    . mometasone (NASONEX) 50 MCG/ACT nasal spray PLACE 2 SPRAYS INTO THE NOSTRILS ONCE DAILY, AS NEEDED 17 g 2   No current facility-administered medications on file prior to visit.     Past Medical History:  Diagnosis Date  . ALLERGIC RHINITIS, SEASONAL   . Allergy   . Blood transfusion without reported diagnosis    Eptopic prgnancy   . GERD   . HYPERLIPIDEMIA, BORDERLINE   . Kidney stone 07/2013   recurrent  . Lymphocytic colitis   . Melanoma ([place]) 2010   (R) knee area  . Sickle cell anemia (HCC)    pt reports sickle cell trait  . Sickle-cell trait ([place])   . THYROID NODULE, RIGHT   . Unspecified hypothyroidism  Past Surgical History:  Procedure Laterality Date  . CHOLECYSTECTOMY  04/2005  . COLONOSCOPY  08/05/2012   tics only  . CYSTOSCOPY WITH RETROGRADE PYELOGRAM, URETEROSCOPY AND STENT PLACEMENT Left 07/06/2013   Procedure: CYSTOSCOPY WITH RETROGRADE PYELOGRAM, URETEROSCOPY AND STENT PLACEMENT;  Surgeon: Molli Hazard, MD;  Location: [facility];  Service: Urology;  Laterality: Left;  . CYSTOSCOPY WITH RETROGRADE PYELOGRAM, URETEROSCOPY AND STENT PLACEMENT Left 07/08/2013   Procedure: CYSTOSCOPY WITH LEFT  RETROGRADE PYELOGRAM  AND STENT PLACEMENT;  Surgeon: Ardis Hughs, MD;  Location: [facility];  Service: Urology;  Laterality: Left;  . CYSTOSCOPY WITH  RETROGRADE PYELOGRAM, URETEROSCOPY AND STENT PLACEMENT Left 07/16/2013   Procedure: CYSTOSCOPY WITH RETROGRADE PYELOGRAM, URETEROSCOPY AND Stone removal, stent exchange;  Surgeon: Molli Hazard, MD;  Location: [facility];  Service: Urology;  Laterality: Left;  . DILATION AND CURETTAGE OF UTERUS    . Eptopic pregnancy    . ESOPHAGOGASTRODUODENOSCOPY  07/12/2010  . HOLMIUM LASER APPLICATION Left 92/12/2681   Procedure: HOLMIUM LASER APPLICATION;  Surgeon: Molli Hazard, MD;  Location: [facility];  Service: Urology;  Laterality: Left;  Marland Kitchen Melanoma in situ excision  2010   (R) thigh   . NASAL SINUS SURGERY    . TONSILLECTOMY     childhood  . UPPER GASTROINTESTINAL ENDOSCOPY      Social History   Socioeconomic History  . Marital status: Married    Spouse name: Not on file  . Number of children: Not on file  . Years of education: Not on file  . Highest education level: Not on file  Occupational History  . Not on file  Social Needs  . Financial resource strain: Not on file  . Food insecurity:    Worry: Not on file    Inability: Not on file  . Transportation needs:    Medical: Not on file    Non-medical: Not on file  Tobacco Use  . Smoking status: Never Smoker  . Smokeless tobacco: Never Used  . Tobacco comment: Married, 2 grown dtr with g-kids plus 2 adopted children, living at home  Substance and Sexual Activity  . Alcohol use: Not Currently    Comment: rare  . Drug use: Never  . Sexual activity: Not on file  Lifestyle  . Physical activity:    Days per week: Not on file    Minutes per session: Not on file  . Stress: Not on file  Relationships  . Social connections:    Talks on phone: Not on file    Gets together: Not on file    Attends religious service: Not on file    Active member of club or organization: Not on file    Attends meetings of clubs or organizations: Not on file    Relationship status: Not on file  Other Topics Concern  . Not on file  Social  History Narrative  . Not on file    Family History  Problem Relation Age of Onset  . Arthritis Mother   . Diverticulosis Mother   . Heart disease Mother   . Arthritis Father   . Heart disease Father   . Breast cancer Sister   . Diabetes Maternal Uncle   . Hypertension Other        parent & grandparent  . Heart disease Other        parent & grandparent  . Lung cancer Brother        smoker  . Graves' disease Daughter   .  Colon cancer Neg Hx   . Esophageal cancer Neg Hx   . Rectal cancer Neg Hx   . Stomach cancer Neg Hx   . Pancreatic cancer Neg Hx   . Prostate cancer Neg Hx     Review of Systems  Constitutional: Positive for diaphoresis (at night only). Negative for chills and fever.  HENT: Positive for congestion, postnasal drip and sinus pressure.   Respiratory: Positive for cough (productive cough - clear sputum). Negative for shortness of breath and wheezing.        Feels like she cant get a deep breath at times  Cardiovascular: Negative for chest pain and palpitations.  Endocrine: Positive for polydipsia. Negative for polyuria.  Genitourinary: Negative for dysuria and hematuria.       Urine odor - fermented smell  Musculoskeletal: Positive for back pain (lower back - intermittent x 2 weeks).       Objective:   Vitals:   10/14/18 0908  BP: 136/82  Pulse: 75  Resp: 16  Temp: 98.7 F (37.1 C)  SpO2: 97%   BP Readings from Last 3 Encounters:  10/14/18 136/82  04/27/18 120/78  12/09/17 110/80   Wt Readings from Last 3 Encounters:  10/14/18 172 lb (78 kg)  04/27/18 164 lb (74.4 kg)  12/09/17 173 lb (78.5 kg)   Body mass index is 28.62 kg/m.   Physical Exam    GENERAL APPEARANCE: Appears stated age, well appearing, NAD EYES: conjunctiva clear, no icterus HEENT: bilateral tympanic membranes and ear canals normal, oropharynx with mild erythema, no thyromegaly, trachea midline, no cervical or supraclavicular lymphadenopathy LUNGS: Clear to auscultation  without wheeze or crackles, unlabored breathing, good air entry bilaterally CARDIOVASCULAR: Normal S1,S2 without murmurs, no edema SKIN: Warm, dry      Assessment & Plan:    See Problem List for Assessment and Plan of chronic medical problems.

## 2018-10-14 ENCOUNTER — Ambulatory Visit: Payer: 59 | Admitting: Internal Medicine

## 2018-10-14 ENCOUNTER — Other Ambulatory Visit (INDEPENDENT_AMBULATORY_CARE_PROVIDER_SITE_OTHER): Payer: 59

## 2018-10-14 ENCOUNTER — Encounter: Payer: Self-pay | Admitting: Internal Medicine

## 2018-10-14 ENCOUNTER — Other Ambulatory Visit: Payer: 59

## 2018-10-14 VITALS — BP 136/82 | HR 75 | Temp 98.7°F | Resp 16 | Ht 65.0 in | Wt 172.0 lb

## 2018-10-14 DIAGNOSIS — R8290 Unspecified abnormal findings in urine: Secondary | ICD-10-CM | POA: Diagnosis not present

## 2018-10-14 DIAGNOSIS — E039 Hypothyroidism, unspecified: Secondary | ICD-10-CM

## 2018-10-14 DIAGNOSIS — E559 Vitamin D deficiency, unspecified: Secondary | ICD-10-CM

## 2018-10-14 DIAGNOSIS — J329 Chronic sinusitis, unspecified: Secondary | ICD-10-CM

## 2018-10-14 DIAGNOSIS — R739 Hyperglycemia, unspecified: Secondary | ICD-10-CM

## 2018-10-14 LAB — COMPREHENSIVE METABOLIC PANEL
ALT: 19 U/L (ref 0–35)
AST: 17 U/L (ref 0–37)
Albumin: 4.3 g/dL (ref 3.5–5.2)
Alkaline Phosphatase: 90 U/L (ref 39–117)
BUN: 17 mg/dL (ref 6–23)
CHLORIDE: 104 meq/L (ref 96–112)
CO2: 25 mEq/L (ref 19–32)
Calcium: 9.6 mg/dL (ref 8.4–10.5)
Creatinine, Ser: 0.72 mg/dL (ref 0.40–1.20)
GFR: 81.28 mL/min (ref >60.00)
Glucose, Bld: 91 mg/dL (ref 70–99)
Potassium: 4.2 mEq/L (ref 3.5–5.1)
Sodium: 139 mEq/L (ref 135–145)
Total Bilirubin: 0.7 mg/dL (ref 0.2–1.2)
Total Protein: 7.4 g/dL (ref 6.0–8.3)

## 2018-10-14 LAB — POCT URINALYSIS DIPSTICK
Bilirubin, UA: NEGATIVE
Glucose, UA: NEGATIVE
Ketones, UA: NEGATIVE
LEUKOCYTES UA: NEGATIVE
NITRITE UA: NEGATIVE
PH UA: 6 (ref 5.0–8.0)
PROTEIN UA: NEGATIVE
RBC UA: NEGATIVE
Spec Grav, UA: 1.025 (ref 1.010–1.025)
UROBILINOGEN UA: NEGATIVE U/dL — AB

## 2018-10-14 LAB — HEMOGLOBIN A1C: Hgb A1c MFr Bld: 5.5 % (ref 4.6–6.5)

## 2018-10-14 LAB — VITAMIN D 25 HYDROXY (VIT D DEFICIENCY, FRACTURES): VITD: 38.51 ng/mL (ref 30.00–100.00)

## 2018-10-14 LAB — TSH: TSH: 1.36 u[IU]/mL (ref 0.35–4.50)

## 2018-10-14 MED ORDER — AZITHROMYCIN 250 MG PO TABS: ORAL_TABLET | ORAL | 0 refills | Status: DC

## 2018-10-14 MED FILL — AZITHROMYCIN 250 MG TABLET: 250 | 5 days supply | Qty: 6 | Fill #0

## 2018-10-14 NOTE — Assessment & Plan Note (Signed)
She has gained weight over the past several weeks, but it is likely related to being sick and having cravings only for sweetened salty foods Will check TSH to see if medication needs to be adjusted

## 2018-10-14 NOTE — Assessment & Plan Note (Signed)
Will rule out infection Urine dip here negative for infection Will send for culture

## 2018-10-14 NOTE — Assessment & Plan Note (Signed)
Still having symptoms consistent with an active sinus infection Completed Augmentin last month Has a history of sinus surgery Will try Z-Pak Continue Nasonex Call if no improvement

## 2018-10-14 NOTE — Assessment & Plan Note (Signed)
History of hyperglycemia, family history diabetes Has gained weight has been eating more sugars Symptoms she is experiencing is unlikely related to diabetes, but CMP, A1c today

## 2018-10-14 NOTE — Patient Instructions (Signed)
  Tests ordered today. Your results will be released to [facility] (or called to you) after review, usually within 72hours after test completion. If any changes need to be made, you will be notified at that same time.   Medications reviewed and updated.  Changes include :   zpak   Your prescription(s) have been submitted to your pharmacy. Please take as directed and contact our office if you believe you are having problem(s) with the medication(s).    Call if no improvement

## 2018-10-14 NOTE — Assessment & Plan Note (Signed)
Has been taking vitamin D-we increased her dose at her last visit Recheck vitamin D level

## 2018-10-15 ENCOUNTER — Encounter: Payer: Self-pay | Admitting: Internal Medicine

## 2018-10-15 LAB — URINE CULTURE
MICRO NUMBER:: 185904
SPECIMEN QUALITY:: ADEQUATE

## 2018-10-17 ENCOUNTER — Encounter: Payer: Self-pay | Admitting: Internal Medicine

## 2018-11-05 ENCOUNTER — Ambulatory Visit: Payer: 59 | Admitting: Gastroenterology

## 2018-11-05 ENCOUNTER — Encounter

## 2018-11-05 ENCOUNTER — Encounter: Payer: Self-pay | Admitting: Gastroenterology

## 2018-11-05 VITALS — BP 124/78 | HR 66 | Ht 64.25 in | Wt 174.0 lb

## 2018-11-05 DIAGNOSIS — R109 Unspecified abdominal pain: Secondary | ICD-10-CM

## 2018-11-05 DIAGNOSIS — K52832 Lymphocytic colitis: Secondary | ICD-10-CM | POA: Diagnosis not present

## 2018-11-05 DIAGNOSIS — R140 Abdominal distension (gaseous): Secondary | ICD-10-CM | POA: Diagnosis not present

## 2018-11-05 MED ORDER — COLESTIPOL HCL 1 G PO TABS: 1.0000 g | ORAL_TABLET | Freq: Two times a day (BID) | ORAL | 3 refills | Status: AC

## 2018-11-05 MED FILL — COLESTIPOL HCL 1 GM TABLET: 1 | 30 days supply | Qty: 120 | Fill #0

## 2018-11-05 NOTE — Progress Notes (Signed)
HPI :  63 year old female here for follow-up visit for lymphocytic colitis  She reports symptoms of chronic diarrhea for several years. She had an extensive workup previously with Dr. [name] to include negative infectious evaluation, weakly positive celiac labs but negative EGD with small bowel biopsies. Prior colonoscopy with random biopsies was negative. She's had a negative fecal elastase.  Her symptoms have persisted over the years and more severe, this led to a repeat colonoscopy with me in October 2018. She had some diverticulosis in her colon, it otherwise appeared normal. Biopsies were obtained and consistent with lymphocytic colitis. She denies any routine use of medications to cause this at the time. No NSAIDs. We have repeated celiac serologies and they were negative.  She was initially treated with course of budesonide which did not help her symptoms. I then tried to get her course of Uceris at the last visit but this was not covered by her insurance. We then put her on a regimen of Imodium and bismuth.  She does think this helped her for a period of time. She was taking one or 2 Imodium a day along with Pepto-Bismol once a day. This did slow down her stool somewhat however did not resolve symptoms. She has stopped taking this. She continues to have loose watery stools with some urgency, often after eating. She continues to deny NSAID use. She does have some discomfort in her right side after eating anything which is usually immediate and then associated with significant bloating. She has a lot of gas that bothers her. She has tried a low FODMAP diet and try to minimize her gluten intake which hasn't helped too much. She otherwise has some rare reflux symptoms which bother her.  She has been tried on rifaximin and Questran in the past, she did not find any benefit from either of those interventions. She never took Viberzi given history of cholecystectomy.   Endoscopic  history: Colonoscopy 06/17/2017 - normal TI, diverticulosis, internal hemorrhoids - otherwise normal, path c/w lymphocytic colitis Colonoscopy 08/2014 - normal with normal biopsies EGD 08/04/2014 - normal, with normal small bowel biopsies   Past Medical History:  Diagnosis Date  . ALLERGIC RHINITIS, SEASONAL   . Allergy   . Blood transfusion without reported diagnosis    Eptopic prgnancy   . GERD   . HYPERLIPIDEMIA, BORDERLINE   . Kidney stone 07/2013   recurrent  . Lymphocytic colitis   . Melanoma ([place]) 2010   (R) knee area  . Sickle cell anemia ([facility])    pt reports sickle cell trait  . Sickle-cell trait ([place])   . THYROID NODULE, RIGHT   . Unspecified hypothyroidism      Past Surgical History:  Procedure Laterality Date  . CHOLECYSTECTOMY  04/2005  . COLONOSCOPY  08/05/2012   tics only  . CYSTOSCOPY WITH RETROGRADE PYELOGRAM, URETEROSCOPY AND STENT PLACEMENT Left 07/06/2013   Procedure: CYSTOSCOPY WITH RETROGRADE PYELOGRAM, URETEROSCOPY AND STENT PLACEMENT;  Surgeon: Molli Hazard, MD;  Location: [facility];  Service: Urology;  Laterality: Left;  . CYSTOSCOPY WITH RETROGRADE PYELOGRAM, URETEROSCOPY AND STENT PLACEMENT Left 07/08/2013   Procedure: CYSTOSCOPY WITH LEFT  RETROGRADE PYELOGRAM  AND STENT PLACEMENT;  Surgeon: Ardis Hughs, MD;  Location: [facility];  Service: Urology;  Laterality: Left;  . CYSTOSCOPY WITH RETROGRADE PYELOGRAM, URETEROSCOPY AND STENT PLACEMENT Left 07/16/2013   Procedure: CYSTOSCOPY WITH RETROGRADE PYELOGRAM, URETEROSCOPY AND Stone removal, stent exchange;  Surgeon: Molli Hazard, MD;  Location: [facility];  Service: Urology;  Laterality: Left;  . DILATION AND CURETTAGE OF UTERUS    . Eptopic pregnancy    . ESOPHAGOGASTRODUODENOSCOPY  07/12/2010  . HOLMIUM LASER APPLICATION Left 65/12/6501   Procedure: HOLMIUM LASER APPLICATION;  Surgeon: Molli Hazard, MD;  Location: [facility];  Service: Urology;  Laterality: Left;  Marland Kitchen Melanoma in situ  excision  2010   (R) thigh   . NASAL SINUS SURGERY    . TONSILLECTOMY     childhood  . UPPER GASTROINTESTINAL ENDOSCOPY     Family History  Problem Relation Age of Onset  . Arthritis Mother   . Diverticulosis Mother   . Heart disease Mother   . Arthritis Father   . Heart disease Father   . Breast cancer Sister   . Diabetes Maternal Uncle   . Hypertension Other        parent & grandparent  . Heart disease Other        parent & grandparent  . Lung cancer Brother        smoker  . Graves' disease Daughter   . Colon cancer Neg Hx   . Esophageal cancer Neg Hx   . Rectal cancer Neg Hx   . Stomach cancer Neg Hx   . Pancreatic cancer Neg Hx   . Prostate cancer Neg Hx    Social History   Tobacco Use  . Smoking status: Never Smoker  . Smokeless tobacco: Never Used  . Tobacco comment: Married, 2 grown dtr with g-kids plus 2 adopted children, living at home  Substance Use Topics  . Alcohol use: Not Currently    Comment: rare  . Drug use: Never   Current Outpatient Medications  Medication Sig Dispense Refill  . Cholecalciferol (VITAMIN D3 PO) Take 2,000 Units by mouth daily.    Marland Kitchen levothyroxine (SYNTHROID, LEVOTHROID) 137 MCG tablet TAKE 1 TABLET BY MOUTH DAILY 90 tablet 1  . loperamide (IMODIUM A-D) 2 MG tablet Take 1 tablet (2 mg total) by mouth every morning. Then as needed. 30 tablet 0  . mometasone (NASONEX) 50 MCG/ACT nasal spray PLACE 2 SPRAYS INTO THE NOSTRILS ONCE DAILY, AS NEEDED 17 g 2   No current facility-administered medications for this visit.    Allergies  Allergen Reactions  . Latex   . Statins     myalgias  . Droperidol     In combination with Fentanyl as Innovar causing Facial drawing     Review of Systems: All systems reviewed and negative except where noted in HPI.   Lab Results  Component Value Date   WBC 6.0 03/20/2018   HGB 14.9 03/20/2018   HCT 43.8 03/20/2018   MCV 86.0 03/20/2018   PLT 208.0 03/20/2018    Lab Results  Component  Value Date   CREATININE 0.72 10/14/2018   BUN 17 10/14/2018   NA 139 10/14/2018   K 4.2 10/14/2018   CL 104 10/14/2018   CO2 25 10/14/2018    Lab Results  Component Value Date   ALT 19 10/14/2018   AST 17 10/14/2018   ALKPHOS 90 10/14/2018   BILITOT 0.7 10/14/2018     Physical Exam: BP 124/78   Pulse 66   Ht 5' 4.25" (1.632 m)   Wt 174 lb (78.9 kg)   LMP  (LMP Unknown)   BMI 29.64 kg/m  Constitutional: Pleasant,well-developed, female in no acute distress. HEENT: Normocephalic and atraumatic. Conjunctivae are normal. No scleral icterus. Neck supple.  Cardiovascular: Normal rate, regular rhythm.  Pulmonary/chest:  Effort normal and breath sounds normal. No wheezing, rales or rhonchi. Abdominal: Soft, nondistended, nontender. There are no masses palpable. No hepatomegaly. Extremities: no edema Lymphadenopathy: No cervical adenopathy noted. Neurological: Alert and oriented to person place and time. Skin: Skin is warm and dry. No rashes noted. Psychiatric: Normal mood and affect. Behavior is normal.   ASSESSMENT AND PLAN: 63 year old female here for reassessment of the following issues:  Lymphocytic colitis / bloating / abdominal discomfort - unfortunately did not respond well to budesonide as outlined. She had a trial of Imodium and bismuth while helped slightly however did not take the bismuth as frequently as was recommended. Since since stopped it and symptoms are persistent at this point. She also has significant gas / bloating / abdominal discomfort at times as well, often postrpandial. I discussed options with her. I think an EGD is reasonable at this time given her upper tract symptoms / postprandial discomfort, to ensure there is no evidence of celiac disease given her remote positive celiac serology, I'm not sure of her latest serology was influenced by the fact that she is trying to minimize gluten intake. I told her to not avoid gluten at this point prior to performing  endoscopy. Otherwise discussed options for treatment of lymphocytic colitis. We'll place her on Colestid 1 g twice a day to start and increase to 2 tabs twice daily as tolerated (she wanted to use capsules rather than cholestyramine powder). Otherwise recommend she take bismuth 3 times a day, he is using Pepto-Bismol over-the-counter which is okay, and she can add Imodium as needed. I counseled her on the safety of Imodium if this helps her she can titrate as needed. She agreed with the plan, all questions answered, will await endoscopy and her course post trial of this regimen.  Fairmount Cellar, MD [facility]

## 2018-11-05 NOTE — Patient Instructions (Signed)
If you are age 65 or older, your body mass index should be between 23-30. Your Body mass index is 29.64 kg/m. If this is out of the aforementioned range listed, please consider follow up with your Primary Care Provider.  If you are age 31 or younger, your body mass index should be between 19-25. Your Body mass index is 29.64 kg/m. If this is out of the aformentioned range listed, please consider follow up with your Primary Care Provider.   You have been scheduled for an endoscopy. Please follow written instructions given to you at your visit today. If you use inhalers (even only as needed), please bring them with you on the day of your procedure. Your physician has requested that you go to www.startemmi.com and enter the access code given to you at your visit today. This web site gives a general overview about your procedure. However, you should still follow specific instructions given to you by our office regarding your preparation for the procedure.  Please purchase the following medications over the counter and take as directed: Pepto-bismol - take three times a day  We have sent the following medications to your pharmacy for you to pick up at your convenience: Colestid 1g: take 1 tablet twice a day.  Can increase to 2 tablets twice a day if tolerated  Thank you for entrusting me with your care and for choosing [employer], Dr. Chance Cellar

## 2018-11-09 ENCOUNTER — Encounter: Payer: Self-pay | Admitting: Gastroenterology

## 2018-11-09 ENCOUNTER — Other Ambulatory Visit: Payer: Self-pay

## 2018-11-09 ENCOUNTER — Ambulatory Visit (AMBULATORY_SURGERY_CENTER): Payer: 59 | Admitting: Gastroenterology

## 2018-11-09 VITALS — BP 132/75 | HR 61 | Temp 98.2°F | Resp 10 | Ht 64.0 in | Wt 174.0 lb

## 2018-11-09 DIAGNOSIS — K2970 Gastritis, unspecified, without bleeding: Secondary | ICD-10-CM

## 2018-11-09 DIAGNOSIS — B9681 Helicobacter pylori [H. pylori] as the cause of diseases classified elsewhere: Secondary | ICD-10-CM | POA: Diagnosis not present

## 2018-11-09 DIAGNOSIS — R140 Abdominal distension (gaseous): Secondary | ICD-10-CM

## 2018-11-09 DIAGNOSIS — K449 Diaphragmatic hernia without obstruction or gangrene: Secondary | ICD-10-CM

## 2018-11-09 DIAGNOSIS — K3189 Other diseases of stomach and duodenum: Secondary | ICD-10-CM | POA: Diagnosis not present

## 2018-11-09 DIAGNOSIS — K529 Noninfective gastroenteritis and colitis, unspecified: Secondary | ICD-10-CM

## 2018-11-09 DIAGNOSIS — K2950 Unspecified chronic gastritis without bleeding: Secondary | ICD-10-CM | POA: Diagnosis not present

## 2018-11-09 DIAGNOSIS — R109 Unspecified abdominal pain: Secondary | ICD-10-CM

## 2018-11-09 MED ORDER — SODIUM CHLORIDE 0.9 % IV SOLN: 500.0000 mL | Freq: Once | INTRAVENOUS | Status: DC

## 2018-11-09 NOTE — Progress Notes (Signed)
Pt's states no medical or surgical changes since previsit or office visit. 

## 2018-11-09 NOTE — Op Note (Signed)
[facility] Patient Name: Lindsey Ferrell Procedure Date: 11/09/2018 3:35 PM MRN: [id number] Endoscopist: Viviann Spare P. Adela Lank , MD Age: 63 Referring MD:  Date of Birth: [birth year] Gender: Female Account #: [id number] Procedure:                Upper GI endoscopy Indications:              Epigastric abdominal pain, Abdominal bloating,                            Diarrhea (history of lymphocytic colitis) - rule                            out celiac disease Medicines:                Monitored Anesthesia Care Procedure:                Pre-Anesthesia Assessment:                           - Prior to the procedure, a History and Physical                            was performed, and patient medications and                            allergies were reviewed. The patient's tolerance of                            previous anesthesia was also reviewed. The risks                            and benefits of the procedure and the sedation                            options and risks were discussed with the patient.                            All questions were answered, and informed consent                            was obtained. Prior Anticoagulants: The patient has                            taken no previous anticoagulant or antiplatelet                            agents. ASA Grade Assessment: II - A patient with                            mild systemic disease. After reviewing the risks                            and benefits, the patient was deemed in  satisfactory condition to undergo the procedure.                           After obtaining informed consent, the endoscope was                            passed under direct vision. Throughout the                            procedure, the patient's blood pressure, pulse, and                            oxygen saturations were monitored continuously. The                            Endoscope was introduced through  the mouth, and                            advanced to the second part of duodenum. The upper                            GI endoscopy was accomplished without difficulty.                            The patient tolerated the procedure well. Scope In: Scope Out: Findings:                 Esophagogastric landmarks were identified: the                            Z-line was found at 35 cm, the gastroesophageal                            junction was found at 35 cm and the upper extent of                            the gastric folds was found at 37 cm from the                            incisors.                           A 2 cm hiatal hernia was present.                           The exam of the esophagus was otherwise normal.                           Patchy localized mildly erythematous mucosa was                            found in the gastric fundus without ulceration.                           The  exam of the stomach was otherwise normal.                           Biopsies were taken with a cold forceps in the                            gastric body, at the incisura and in the gastric                            antrum for Helicobacter pylori testing.                           The duodenal bulb and second portion of the                            duodenum were normal. Biopsies for histology were                            taken with a cold forceps for evaluation of celiac                            disease. Complications:            No immediate complications. Estimated blood loss:                            Minimal. Estimated Blood Loss:     Estimated blood loss was minimal. Impression:               - Esophagogastric landmarks identified.                           - 2 cm hiatal hernia.                           - Normal esophagus otherwise                           - Mildly erythematous mucosa in the gastric fundus.                            Normal stomach otherwise, biopsies taken to  rule                            out H pylori                           - Normal duodenal bulb and second portion of the                            duodenum. Biopsied to rule out celiac disease. Recommendation:           - Patient has a contact number available for                            emergencies. The signs and symptoms of  potential                            delayed complications were discussed with the                            patient. Return to normal activities tomorrow.                            Written discharge instructions were provided to the                            patient.                           - Resume previous diet.                           - Continue present medications, including Colestid                            / Bismuth regimen as outlined in clinic, will see                            if that helps symptoms                           - Await pathology results. Viviann Spare P. Rokia Bosket, MD 11/09/2018 3:59:25 PM This report has been signed electronically.

## 2018-11-09 NOTE — Progress Notes (Signed)
Called to room to assist during endoscopic procedure.  Patient ID and intended procedure confirmed with present staff. Received instructions for my participation in the procedure from the performing physician.  

## 2018-11-09 NOTE — Progress Notes (Signed)
A and O x3. Report to RN. Tolerated MAC anesthesia well.Teeth unchanged after procedure.

## 2018-11-09 NOTE — Patient Instructions (Signed)
YOU HAD AN ENDOSCOPIC PROCEDURE TODAY AT THE [facility]:   Refer to the procedure report that was given to you for any specific questions about what was found during the examination.  If the procedure report does not answer your questions, please call your gastroenterologist to clarify.  If you requested that your care partner not be given the details of your procedure findings, then the procedure report has been included in a sealed envelope for you to review at your convenience later.  YOU SHOULD EXPECT: Some feelings of bloating in the abdomen. Passage of more gas than usual.  Walking can help get rid of the air that was put into your GI tract during the procedure and reduce the bloating. If you had a lower endoscopy (such as a colonoscopy or flexible sigmoidoscopy) you may notice spotting of blood in your stool or on the toilet paper. If you underwent a bowel prep for your procedure, you may not have a normal bowel movement for a few days.  Please Note:  You might notice some irritation and congestion in your nose or some drainage.  This is from the oxygen used during your procedure.  There is no need for concern and it should clear up in a day or so.  SYMPTOMS TO REPORT IMMEDIATELY:   Following upper endoscopy (EGD)  Vomiting of blood or coffee ground material  New chest pain or pain under the shoulder blades  Painful or persistently difficult swallowing  New shortness of breath  Fever of 100F or higher  Black, tarry-looking stools  For urgent or emergent issues, a gastroenterologist can be reached at any hour by calling [phone].   DIET:  We do recommend a small meal at first, but then you may proceed to your regular diet.  Drink plenty of fluids but you should avoid alcoholic beverages for 24 hours.  ACTIVITY:  You should plan to take it easy for the rest of today and you should NOT DRIVE or use heavy machinery until tomorrow (because of the sedation medicines used  during the test).    FOLLOW UP: Our staff will call the number listed on your records the next business day following your procedure to check on you and address any questions or concerns that you may have regarding the information given to you following your procedure. If we do not reach you, we will leave a message.  However, if you are feeling well and you are not experiencing any problems, there is no need to return our call.  We will assume that you have returned to your regular daily activities without incident.  If any biopsies were taken you will be contacted by phone or by letter within the next 1-3 weeks.  Please call us at [phone] if you have not heard about the biopsies in 3 weeks.    SIGNATURES/CONFIDENTIALITY: You and/or your care partner have signed paperwork which will be entered into your electronic medical record.  These signatures attest to the fact that that the information above on your After Visit Summary has been reviewed and is understood.  Full responsibility of the confidentiality of this discharge information lies with you and/or your care-partner. 

## 2018-11-10 ENCOUNTER — Telehealth: Payer: Self-pay | Admitting: *Deleted

## 2018-11-10 NOTE — Telephone Encounter (Signed)
  Follow up Call-  Call back number 11/09/2018 06/17/2017  Post procedure Call Back phone  # [phone] cell  Permission to leave phone message Yes Yes  Some recent data might be hidden     Patient questions:  Do you have a fever, pain , or abdominal swelling? No. Pain Score  0 *  Have you tolerated food without any problems? Yes.    Have you been able to return to your normal activities? Yes.    Do you have any questions about your discharge instructions: Diet   No. Medications  No. Follow up visit  No.  Do you have questions or concerns about your Care? No.  Actions: * If pain score is 4 or above: No action needed, pain <4.

## 2018-11-12 ENCOUNTER — Other Ambulatory Visit: Payer: Self-pay | Admitting: Internal Medicine

## 2018-11-14 ENCOUNTER — Encounter: Payer: Self-pay | Admitting: Internal Medicine

## 2018-11-15 MED ORDER — LEVOTHYROXINE SODIUM 137 MCG PO TABS: 137.0000 ug | ORAL_TABLET | Freq: Every day | ORAL | 1 refills | Status: DC

## 2018-11-15 MED ORDER — MOMETASONE FUROATE 50 MCG/ACT NA SUSP: NASAL | 11 refills | Status: DC

## 2018-11-16 ENCOUNTER — Telehealth: Payer: Self-pay | Admitting: Gastroenterology

## 2018-11-16 ENCOUNTER — Other Ambulatory Visit: Payer: Self-pay

## 2018-11-16 DIAGNOSIS — A048 Other specified bacterial intestinal infections: Secondary | ICD-10-CM

## 2018-11-16 MED ORDER — DOXYCYCLINE HYCLATE 100 MG PO CAPS: 100.0000 mg | ORAL_CAPSULE | Freq: Two times a day (BID) | ORAL | 0 refills | Status: AC

## 2018-11-16 MED ORDER — DOXYCYCLINE HYCLATE 100 MG PO CAPS: 100.0000 mg | ORAL_CAPSULE | Freq: Four times a day (QID) | ORAL | 0 refills | Status: DC

## 2018-11-16 MED ORDER — OMEPRAZOLE 20 MG PO CPDR: 20.0000 mg | DELAYED_RELEASE_CAPSULE | Freq: Two times a day (BID) | ORAL | 0 refills | Status: DC

## 2018-11-16 MED ORDER — BISMUTH SUBSALICYLATE 262 MG PO TABS: 2.0000 | ORAL_TABLET | Freq: Four times a day (QID) | ORAL | 0 refills | Status: AC

## 2018-11-16 MED ORDER — METRONIDAZOLE 250 MG PO TABS: 500.0000 mg | ORAL_TABLET | Freq: Three times a day (TID) | ORAL | 0 refills | Status: AC

## 2018-11-16 MED FILL — metroNIDAZOLE 250 MG TABS: 250 | 10 days supply | Qty: 60 | Fill #0

## 2018-11-16 MED FILL — LEVOTHYROXINE 137 MCG TAB: 137 | 90 days supply | Qty: 90 | Fill #0

## 2018-11-16 MED FILL — DOXYCYCLINE HYC 100 MG CAPS: 100 | 10 days supply | Qty: 20 | Fill #0

## 2018-11-16 MED FILL — OMEPRAZOLE 20 MG CPDR: 20 | 10 days supply | Qty: 20 | Fill #0

## 2018-11-16 MED FILL — MOMETASONE FUROATE 50 MCG S: 50 | 30 days supply | Qty: 17 | Fill #0

## 2018-11-16 NOTE — Telephone Encounter (Signed)
Pharmacy calling to verify that medication doxycycline, which was sent in today was suppose to be 100mg  4x daily. Pharmacy states that is a pretty high amount, and wanted to make sure it was correct. Pharmacy requesting call back.

## 2018-11-16 NOTE — Telephone Encounter (Signed)
Medication error was corrected with the pharmacy.

## 2018-11-26 ENCOUNTER — Ambulatory Visit: Payer: 59 | Admitting: Rheumatology

## 2019-01-26 MED FILL — MOMETASONE FUROATE 50 MCG S: 50 | 30 days supply | Qty: 17 | Fill #1

## 2019-01-27 MED FILL — LEVOTHYROXINE 137 MCG TAB: 137 | 90 days supply | Qty: 90 | Fill #1

## 2019-03-19 MED FILL — MOMETASONE FUROATE 50 MCG S: 50 | 30 days supply | Qty: 17 | Fill #2

## 2019-03-22 ENCOUNTER — Ambulatory Visit: Payer: 59 | Admitting: Podiatry

## 2019-03-23 ENCOUNTER — Telehealth: Payer: 59 | Admitting: Family

## 2019-03-23 DIAGNOSIS — S80862A Insect bite (nonvenomous), left lower leg, initial encounter: Secondary | ICD-10-CM | POA: Diagnosis not present

## 2019-03-23 DIAGNOSIS — W57XXXA Bitten or stung by nonvenomous insect and other nonvenomous arthropods, initial encounter: Secondary | ICD-10-CM

## 2019-03-23 MED ORDER — PREDNISONE 20 MG PO TABS: 40.0000 mg | ORAL_TABLET | Freq: Every day | ORAL | 0 refills | Status: DC

## 2019-03-23 NOTE — Progress Notes (Signed)
E Visit for Insect Sting  Thank you for describing the insect sting for [name].  Here is how we plan to help!  A sting that we will treat with a short course of prednisone.  The 2 greatest risks from insect stings are allergic reaction, which can be fatal in some people and infection, which is more common and less serious.  Bees, wasps, yellow jackets, and hornets belong to a class of insects called Hymenoptera.  Most insect stings cause only minor discomfort.  Stings can happen anywhere on the body and can be painful.  Most stings are from honey bees or yellow jackets.  Fire ants can sting multiple times.  The sites of the stings are more likely to become infected.    I have sent in prednisone 40 mg by mouth daily for 5 days to the pharmacy you selected.  Please make sure that you selected a pharmacy that is open now.  What can be used to prevent Insect Stings?   Insect repellant with at least 20% DEET.    Wearing long pants and shirts with socks and shoes.    Wear dark or drab-colored clothes rather than bright colors.    Avoid using perfumes and hair sprays; these attract insects.  HOME CARE ADVICE:  1. Stinger removal:  The stinger looks like a tiny black dot in the sting.  Use a fingernail, credit card edge, or knife-edge to scrape it off.  Don't pull it out because it squeezes out more venom.  If the stinger is below the skin surface, leave it alone.  It will be shed with normal skin healing. 2. Use cold compresses to the area of the sting for 10-20 minutes.  You may repeat this as needed to relieve symptoms of pain and swelling. 3.  For pain relief, take acetominophen 650 mg 4-6 hours as needed or ibuprofen 400 mg every 6-8 hours as needed or naproxen 250-500 mg every 12 hours as needed. 4.  You can also use hydrocortisone cream 0.5% or 1% up to 4 times daily as needed for itching. 5.  If the sting becomes very itchy, take Benadryl 25-50 mg, follow directions on box. 6.   Wash the area 2-3 times daily with antibacterial soap and warm water. 7. Call your Doctor if:  Fever, a severe headache, or rash occur in the next 2 weeks.  Sting area begins to look infected.  Redness and swelling worsens after home treatment.  Your current symptoms become worse.    MAKE SURE YOU:   Understand these instructions.  Will watch your condition.  Will get help right away if you are not doing well or get worse.  Thank you for choosing an e-visit. Your e-visit answers were reviewed by a board certified advanced clinical practitioner to complete your personal care plan. Depending upon the condition, your plan could have included both over the counter or prescription medications. Please review your pharmacy choice. Be sure that the pharmacy you have chosen is open so that you can pick up your prescription now.  If there is a problem you may message your provider in [place] Ferrell to have the prescription routed to another pharmacy. Your safety is important to [name]. If you have drug allergies check your prescription carefully.  For the next 24 hours, you can use MyChart to ask questions about today's visit, request a non-urgent call back, or ask for a work or school excuse from your e-visit provider. You will get an email in the  next two days asking about your experience. I hope that your e-visit has been valuable and will speed your recovery.  Greater than 5 minutes, yet less than 10 minutes of time have been spent researching, coordinating, and implementing care for this patient today.  Thank you for the details you included in the comment boxes. Those details are very helpful in determining the best course of treatment for you and help [name] to provide the best care.

## 2019-03-31 ENCOUNTER — Telehealth: Payer: 59 | Admitting: Family

## 2019-03-31 DIAGNOSIS — W57XXXD Bitten or stung by nonvenomous insect and other nonvenomous arthropods, subsequent encounter: Secondary | ICD-10-CM | POA: Diagnosis not present

## 2019-03-31 DIAGNOSIS — R21 Rash and other nonspecific skin eruption: Secondary | ICD-10-CM | POA: Diagnosis not present

## 2019-03-31 MED ORDER — SULFAMETHOXAZOLE-TRIMETHOPRIM 800-160 MG PO TABS: 1.0000 | ORAL_TABLET | Freq: Two times a day (BID) | ORAL | 0 refills | Status: DC

## 2019-03-31 NOTE — Progress Notes (Signed)
E Visit for Cellulitis  We are sorry that you are not feeling well. Here is how we plan to help!  Based on what you shared with me it looks like you have cellulitis.  Cellulitis looks like areas of skin redness, swelling, and warmth; it develops as a result of bacteria entering under the skin. Little red spots and/or bleeding can be seen in skin, and tiny surface sacs containing fluid can occur. Fever can be present. Cellulitis is almost always on one side of a body, and the lower limbs are the most common site of involvement.   I have prescribed:  Bactrim DS 1 tablet by mouth BID for 7 days. I recommend that you follow up for face to face visit in the next 1-2 days.  Approximately 5 minutes was spent documenting and reviewing patient's chart.    HOME CARE:  . Take your medications as ordered and take all of them, even if the skin irritation appears to be healing.   GET HELP RIGHT AWAY IF:  . Symptoms that don't begin to go away within 48 hours. . Severe redness persists or worsens . If the area turns color, spreads or swells. . If it blisters and opens, develops yellow-brown crust or bleeds. . You develop a fever or chills. . If the pain increases or becomes unbearable.  . Are unable to keep fluids and food down.  MAKE SURE YOU    Understand these instructions.  Will watch your condition.  Will get help right away if you are not doing well or get worse.  Thank you for choosing an e-visit. Your e-visit answers were reviewed by a board certified advanced clinical practitioner to complete your personal care plan. Depending upon the condition, your plan could have included both over the counter or prescription medications. Please review your pharmacy choice. Make sure the pharmacy is open so you can pick up prescription now. If there is a problem, you may contact your provider through [employer] and have the prescription routed to another pharmacy. Your safety is important to  [name]. If you have drug allergies check your prescription carefully.  For the next 24 hours you can use MyChart to ask questions about today's visit, request a non-urgent call back, or ask for a work or school excuse. You will get an email in the next two days asking about your experience. I hope that your e-visit has been valuable and will speed your recovery.

## 2019-04-27 ENCOUNTER — Encounter: Payer: Self-pay | Admitting: Internal Medicine

## 2019-04-28 NOTE — Progress Notes (Signed)
Subjective:    Patient ID: Lindsey Ferrell, female    DOB: September 02, 1956, 62 y.o.   MRN: [id number]  HPI She is here for a physical exam.    The biggest change in her medical problems is that her chronic diarrhea that she had for 10 years has resolved.  It resolved after being treated for H. pylori with doxycycline and Flagyl.  She denies any constipation, diarrhea or abdominal pain.  She is experiencing plantar fasciitis in her right foot.  She is doing everything she should be doing and has an appointment with someone for that tomorrow.  He has not been able to exercise as much.  She would like to lose weight.   Medications and allergies reviewed with patient and updated if appropriate.  Patient Active Problem List   Diagnosis Date Noted  . Chronic sinus infection 10/14/2018  . Abnormal urine odor 10/14/2018  . Vitamin B12 deficiency 04/27/2018  . Hyperglycemia 12/09/2017  . Other microscopic colitis 11/14/2017  . Malignant melanoma of right lower extremity including hip ([place]) 09/16/2017  . Anxiety 08/23/2017  . Positive ANA (antinuclear antibody) 01/11/2016  . Vitamin D deficiency 01/08/2016  . IBS (irritable bowel syndrome) 05/03/2015  . 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 Plantar fasciitis of left foot 01/31/2014  . GERD 05/15/2010  . Hyperlipidemia 06/09/2009  . THYROID NODULE, RIGHT 12/21/2008  . Hypothyroidism 12/21/2008  . SICKLE-CELL TRAIT 12/21/2008  . ALLERGIC RHINITIS 12/21/2008  . ASTHMA 12/21/2008    Current Outpatient Medications on File Prior to Visit  Medication Sig Dispense Refill  . bismuth subsalicylate (PEPTO BISMOL) 262 MG/15ML suspension Take 30 mLs by mouth 3 (three) times daily.    . Cholecalciferol (VITAMIN D3 PO) Take 2,000 Units by mouth daily.    . colestipol (COLESTID) 1 g tablet Take 1 tablet (1 g total) by mouth 2 (two) times daily. Can increase to 2 tablets twice a day if tolerated 120 tablet 3  . levothyroxine (SYNTHROID,  LEVOTHROID) 137 MCG tablet Take 1 tablet (137 mcg total) by mouth daily. 90 tablet 1  . loperamide (IMODIUM A-D) 2 MG tablet Take 1 tablet (2 mg total) by mouth every morning. Then as needed. 30 tablet 0  . mometasone (NASONEX) 50 MCG/ACT nasal spray PLACE 2 SPRAYS INTO THE NOSTRILS ONCE DAILY, AS NEEDED 17 g 11  . omeprazole (PRILOSEC) 20 MG capsule Take 1 capsule (20 mg total) by mouth 2 (two) times daily before a meal for 10 days. 20 capsule 0   No current facility-administered medications on file prior to visit.     Past Medical History:  Diagnosis Date  . ALLERGIC RHINITIS, SEASONAL   . Allergy   . Blood transfusion without reported diagnosis    Eptopic prgnancy   . GERD   . HYPERLIPIDEMIA, BORDERLINE   . Kidney stone 07/2013   recurrent  . Lymphocytic colitis   . Melanoma ([place]) 2010   (R) knee area  . Sickle cell anemia ([facility])    pt reports sickle cell trait  . Sickle-cell trait ([place])   . THYROID NODULE, RIGHT   . Unspecified hypothyroidism     Past Surgical History:  Procedure Laterality Date  . CHOLECYSTECTOMY  04/2005  . COLONOSCOPY  08/05/2012   tics only  . CYSTOSCOPY WITH RETROGRADE PYELOGRAM, URETEROSCOPY AND STENT PLACEMENT Left 07/06/2013   Procedure: CYSTOSCOPY WITH RETROGRADE PYELOGRAM, URETEROSCOPY AND STENT PLACEMENT;  Surgeon: Molli Hazard, MD;  Location: [facility];  Service: Urology;  Laterality: Left;  .  CYSTOSCOPY WITH RETROGRADE PYELOGRAM, URETEROSCOPY AND STENT PLACEMENT Left 07/08/2013   Procedure: CYSTOSCOPY WITH LEFT  RETROGRADE PYELOGRAM  AND STENT PLACEMENT;  Surgeon: Ardis Hughs, MD;  Location: [facility];  Service: Urology;  Laterality: Left;  . CYSTOSCOPY WITH RETROGRADE PYELOGRAM, URETEROSCOPY AND STENT PLACEMENT Left 07/16/2013   Procedure: CYSTOSCOPY WITH RETROGRADE PYELOGRAM, URETEROSCOPY AND Stone removal, stent exchange;  Surgeon: Molli Hazard, MD;  Location: [facility];  Service: Urology;  Laterality: Left;  . DILATION AND  CURETTAGE OF UTERUS    . Eptopic pregnancy    . ESOPHAGOGASTRODUODENOSCOPY  07/12/2010  . HOLMIUM LASER APPLICATION Left [id number]   Procedure: HOLMIUM LASER APPLICATION;  Surgeon: Molli Hazard, MD;  Location: [facility];  Service: Urology;  Laterality: Left;  Marland Kitchen Melanoma in situ excision  2010   (R) thigh   . NASAL SINUS SURGERY    . TONSILLECTOMY     childhood  . UPPER GASTROINTESTINAL ENDOSCOPY      Social History   Socioeconomic History  . Marital status: Married    Spouse name: Not on file  . Number of children: Not on file  . Years of education: Not on file  . Highest education level: Not on file  Occupational History  . Not on file  Social Needs  . Financial resource strain: Not on file  . Food insecurity    Worry: Not on file    Inability: Not on file  . Transportation needs    Medical: Not on file    Non-medical: Not on file  Tobacco Use  . Smoking status: Never Smoker  . Smokeless tobacco: Never Used  . Tobacco comment: Married, 2 grown dtr with g-kids plus 2 adopted children, living at home  Substance and Sexual Activity  . Alcohol use: Not Currently    Comment: rare  . Drug use: Never  . Sexual activity: Not on file  Lifestyle  . Physical activity    Days per week: Not on file    Minutes per session: Not on file  . Stress: Not on file  Relationships  . Social Herbalist on phone: Not on file    Gets together: Not on file    Attends religious service: Not on file    Active member of club or organization: Not on file    Attends meetings of clubs or organizations: Not on file    Relationship status: Not on file  Other Topics Concern  . Not on file  Social History Narrative  . Not on file    Family History  Problem Relation Age of Onset  . Arthritis Mother   . Diverticulosis Mother   . Heart disease Mother   . Arthritis Father   . Heart disease Father   . Breast cancer Sister   . Diabetes Maternal Uncle   . Hypertension Other         parent & grandparent  . Heart disease Other        parent & grandparent  . Lung cancer Brother        smoker  . Graves' disease Daughter   . Colon cancer Neg Hx   . Esophageal cancer Neg Hx   . Rectal cancer Neg Hx   . Stomach cancer Neg Hx   . Pancreatic cancer Neg Hx   . Prostate cancer Neg Hx     Review of Systems  Constitutional: Negative for chills, fatigue and fever.  Eyes: Negative for visual disturbance.  Respiratory: Negative for cough, shortness of breath and wheezing.   Cardiovascular: Negative for chest pain, palpitations and leg swelling.  Gastrointestinal: Negative for abdominal pain, blood in stool, constipation and nausea.  Genitourinary: Negative for dysuria and hematuria.  Musculoskeletal: Positive for back pain (achiness at times).       Plantar fasciits  Skin: Negative for color change and rash.  Neurological: Negative for light-headedness, numbness and headaches.  Psychiatric/Behavioral: Negative for dysphoric mood. The patient is not nervous/anxious.        Objective:   Vitals:   04/29/19 1051  BP: 128/74  Pulse: 65  Temp: 97.8 F (36.6 C)  SpO2: 97%   Filed Weights   04/29/19 1051  Weight: 175 lb 12.8 oz (79.7 kg)   Body mass index is 30.18 kg/m.  BP Readings from Last 3 Encounters:  04/29/19 128/74  11/09/18 132/75  11/05/18 124/78    Wt Readings from Last 3 Encounters:  04/29/19 175 lb 12.8 oz (79.7 kg)  11/09/18 174 lb (78.9 kg)  11/05/18 174 lb (78.9 kg)     Physical Exam Constitutional: She appears well-developed and well-nourished. No distress.  HENT:  Head: Normocephalic and atraumatic.  Right Ear: External ear normal. Normal ear canal and TM Left Ear: External ear normal.  Normal ear canal and TM Mouth/Throat: Oropharynx is clear and moist.  Eyes: Conjunctivae and EOM are normal.  Neck: Neck supple. No tracheal deviation present. No thyromegaly present. No carotid bruit  Cardiovascular: Normal rate, regular rhythm  and normal heart sounds.  No murmur heard.  No edema. Pulmonary/Chest: Effort normal and breath sounds normal. No respiratory distress. She has no wheezes. She has no rales.  Breast: deferred   Abdominal: Soft. She exhibits no distension. There is no tenderness.  Lymphadenopathy: She has no cervical adenopathy.  Skin: Skin is warm and dry. She is not diaphoretic.  Psychiatric: She has a normal mood and affect. Her behavior is normal.        Assessment & Plan:   Physical exam: Screening blood work ordered Immunizations  prevnar today, flu today Colonoscopy  Up to date  Mammogram   -will be done in [place]  - sees gyn in October  Dexa  -  Will be dpne in Oct w/ gyn Eye exams  Will schedule, due Exercise  Limited due to plantar fasciitis Weight - working on weight loss Skin  Sees dermatology annually Substance abuse  none  See Problem List for Assessment and Plan of chronic medical problems.   FU in one year

## 2019-04-28 NOTE — Patient Instructions (Addendum)
Tests ordered today. Your results will be released to [place] (or called to you) after review.  If any changes need to be made, you will be notified at that same time.  All other Health Maintenance issues reviewed.   All recommended immunizations and age-appropriate screenings are up-to-date or discussed.  Flu and prevnar pneumonia immunizations administered today.   Medications reviewed and updated.  Changes include :   Restart omeprazole for your reflux - you can take daily or as needed   Your prescription(s) have been submitted to your pharmacy. Please take as directed and contact our office if you believe you are having problem(s) with the medication(s).  Please followup in 1 year   Health Maintenance, Female Adopting a healthy lifestyle and getting preventive care are important in promoting health and wellness. Ask your health care provider about:  The right schedule for you to have regular tests and exams.  Things you can do on your own to prevent diseases and keep yourself healthy. What should I know about diet, weight, and exercise? Eat a healthy diet   Eat a diet that includes plenty of vegetables, fruits, low-fat dairy products, and lean protein.  Do not eat a lot of foods that are high in solid fats, added sugars, or sodium. Maintain a healthy weight Body mass index (BMI) is used to identify weight problems. It estimates body fat based on height and weight. Your health care provider can help determine your BMI and help you achieve or maintain a healthy weight. Get regular exercise Get regular exercise. This is one of the most important things you can do for your health. Most adults should:  Exercise for at least 150 minutes each week. The exercise should increase your heart rate and make you sweat (moderate-intensity exercise).  Do strengthening exercises at least twice a week. This is in addition to the moderate-intensity exercise.  Spend less time sitting. Even light  physical activity can be beneficial. Watch cholesterol and blood lipids Have your blood tested for lipids and cholesterol at 65 years of age, then have this test every 5 years. Have your cholesterol levels checked more often if:  Your lipid or cholesterol levels are high.  You are older than 65 years of age.  You are at high risk for heart disease. What should I know about cancer screening? Depending on your health history and family history, you may need to have cancer screening at various ages. This may include screening for:  Breast cancer.  Cervical cancer.  Colorectal cancer.  Skin cancer.  Lung cancer. What should I know about heart disease, diabetes, and high blood pressure? Blood pressure and heart disease  High blood pressure causes heart disease and increases the risk of stroke. This is more likely to develop in people who have high blood pressure readings, are of African descent, or are overweight.  Have your blood pressure checked: ? Every 3-5 years if you are 62-34 years of age. ? Every year if you are 75 years old or older. Diabetes Have regular diabetes screenings. This checks your fasting blood sugar level. Have the screening done:  Once every three years after age 44 if you are at a normal weight and have a low risk for diabetes.  More often and at a younger age if you are overweight or have a high risk for diabetes. What should I know about preventing infection? Hepatitis B If you have a higher risk for hepatitis B, you should be screened for this virus.  Talk with your health care provider to find out if you are at risk for hepatitis B infection. Hepatitis C Testing is recommended for:  Everyone born from [birth year] through 1967.  Anyone with known risk factors for hepatitis C. Sexually transmitted infections (STIs)  Get screened for STIs, including gonorrhea and chlamydia, if: ? You are sexually active and are younger than 65 years of age. ? You are older  than 65 years of age and your health care provider tells you that you are at risk for this type of infection. ? Your sexual activity has changed since you were last screened, and you are at increased risk for chlamydia or gonorrhea. Ask your health care provider if you are at risk.  Ask your health care provider about whether you are at high risk for HIV. Your health care provider may recommend a prescription medicine to help prevent HIV infection. If you choose to take medicine to prevent HIV, you should first get tested for HIV. You should then be tested every 3 months for as long as you are taking the medicine. Pregnancy  If you are about to stop having your period (premenopausal) and you may become pregnant, seek counseling before you get pregnant.  Take 400 to 800 micrograms (mcg) of folic acid every day if you become pregnant.  Ask for birth control (contraception) if you want to prevent pregnancy. Osteoporosis and menopause Osteoporosis is a disease in which the bones lose minerals and strength with aging. This can result in bone fractures. If you are 29 years old or older, or if you are at risk for osteoporosis and fractures, ask your health care provider if you should:  Be screened for bone loss.  Take a calcium or vitamin D supplement to lower your risk of fractures.  Be given hormone replacement therapy (HRT) to treat symptoms of menopause. Follow these instructions at home: Lifestyle  Do not use any products that contain nicotine or tobacco, such as cigarettes, e-cigarettes, and chewing tobacco. If you need help quitting, ask your health care provider.  Do not use street drugs.  Do not share needles.  Ask your health care provider for help if you need support or information about quitting drugs. Alcohol use  Do not drink alcohol if: ? Your health care provider tells you not to drink. ? You are pregnant, may be pregnant, or are planning to become pregnant.  If you drink  alcohol: ? Limit how much you use to 0-1 drink a day. ? Limit intake if you are breastfeeding.  Be aware of how much alcohol is in your drink. In the [place] one drink equals one 12 oz bottle of beer (355 mL), one 5 oz glass of wine (148 mL), or one 1 oz glass of hard liquor (44 mL). General instructions  Schedule regular health, dental, and eye exams.  Stay current with your vaccines.  Tell your health care provider if: ? You often feel depressed. ? You have ever been abused or do not feel safe at home. Summary  Adopting a healthy lifestyle and getting preventive care are important in promoting health and wellness.  Follow your health care provider's instructions about healthy diet, exercising, and getting tested or screened for diseases.  Follow your health care provider's instructions on monitoring your cholesterol and blood pressure. This information is not intended to replace advice given to you by your health care provider. Make sure you discuss any questions you have with your health care provider. Document Released:  03/04/2011 Document Revised: 08/12/2018 Document Reviewed: 08/12/2018 Elsevier Patient Education  [employer].

## 2019-04-29 ENCOUNTER — Encounter: Payer: Self-pay | Admitting: Internal Medicine

## 2019-04-29 ENCOUNTER — Ambulatory Visit: Payer: 59 | Admitting: Internal Medicine

## 2019-04-29 ENCOUNTER — Ambulatory Visit (INDEPENDENT_AMBULATORY_CARE_PROVIDER_SITE_OTHER): Payer: 59 | Admitting: Internal Medicine

## 2019-04-29 ENCOUNTER — Other Ambulatory Visit: Payer: Self-pay

## 2019-04-29 VITALS — BP 128/74 | HR 65 | Temp 97.8°F | Ht 64.0 in | Wt 175.8 lb

## 2019-04-29 DIAGNOSIS — E782 Mixed hyperlipidemia: Secondary | ICD-10-CM

## 2019-04-29 DIAGNOSIS — Z1159 Encounter for screening for other viral diseases: Secondary | ICD-10-CM | POA: Diagnosis not present

## 2019-04-29 DIAGNOSIS — Z0000 Encounter for general adult medical examination without abnormal findings: Secondary | ICD-10-CM

## 2019-04-29 DIAGNOSIS — E039 Hypothyroidism, unspecified: Secondary | ICD-10-CM | POA: Diagnosis not present

## 2019-04-29 DIAGNOSIS — K219 Gastro-esophageal reflux disease without esophagitis: Secondary | ICD-10-CM

## 2019-04-29 DIAGNOSIS — K52838 Other microscopic colitis: Secondary | ICD-10-CM | POA: Diagnosis not present

## 2019-04-29 DIAGNOSIS — R768 Other specified abnormal immunological findings in serum: Secondary | ICD-10-CM

## 2019-04-29 DIAGNOSIS — R739 Hyperglycemia, unspecified: Secondary | ICD-10-CM

## 2019-04-29 DIAGNOSIS — C4371 Malignant melanoma of right lower limb, including hip: Secondary | ICD-10-CM

## 2019-04-29 DIAGNOSIS — Z23 Encounter for immunization: Secondary | ICD-10-CM | POA: Diagnosis not present

## 2019-04-29 MED ORDER — OMEPRAZOLE 20 MG PO CPDR: 20.0000 mg | DELAYED_RELEASE_CAPSULE | Freq: Two times a day (BID) | ORAL | 3 refills | Status: AC

## 2019-04-29 MED FILL — OMEPRAZOLE 20 MG CAPSULE DR: 20 | 90 days supply | Qty: 180 | Fill #0

## 2019-04-29 NOTE — Assessment & Plan Note (Signed)
Statin intolerant Check lipid panel  Regular exercise and healthy diet encouraged  

## 2019-04-29 NOTE — Assessment & Plan Note (Signed)
Clinically euthyroid, having difficulty losing weight Check tsh  Titrate med dose if needed

## 2019-04-29 NOTE — Assessment & Plan Note (Signed)
Sees derm regularly.

## 2019-04-29 NOTE — Assessment & Plan Note (Signed)
Diarrhea resolved  Took colestid, pepto treatment for H pylori - doxy and flagyl

## 2019-04-29 NOTE — Assessment & Plan Note (Signed)
Having some new back pain and ankle pain Given high ANA - will recheck and esr, crp

## 2019-04-29 NOTE — Assessment & Plan Note (Signed)
Having some GERD Restart omeprazole daily or prn

## 2019-04-29 NOTE — Assessment & Plan Note (Signed)
a1c

## 2019-04-30 ENCOUNTER — Other Ambulatory Visit (INDEPENDENT_AMBULATORY_CARE_PROVIDER_SITE_OTHER): Payer: 59

## 2019-04-30 DIAGNOSIS — E039 Hypothyroidism, unspecified: Secondary | ICD-10-CM

## 2019-04-30 DIAGNOSIS — E782 Mixed hyperlipidemia: Secondary | ICD-10-CM

## 2019-04-30 DIAGNOSIS — R739 Hyperglycemia, unspecified: Secondary | ICD-10-CM | POA: Diagnosis not present

## 2019-04-30 DIAGNOSIS — R768 Other specified abnormal immunological findings in serum: Secondary | ICD-10-CM

## 2019-04-30 DIAGNOSIS — Z0000 Encounter for general adult medical examination without abnormal findings: Secondary | ICD-10-CM

## 2019-04-30 LAB — LIPID PANEL
Cholesterol: 212 mg/dL — ABNORMAL HIGH (ref 0–200)
HDL: 46 mg/dL (ref >39.00)
NonHDL: 166.07
Total CHOL/HDL Ratio: 5
Triglycerides: 213 mg/dL — ABNORMAL HIGH (ref 0.0–149.0)
VLDL: 42.6 mg/dL — ABNORMAL HIGH (ref 0.0–40.0)

## 2019-04-30 LAB — COMPREHENSIVE METABOLIC PANEL
ALT: 17 U/L (ref 0–35)
AST: 15 U/L (ref 0–37)
Albumin: 4.1 g/dL (ref 3.5–5.2)
Alkaline Phosphatase: 87 U/L (ref 39–117)
BUN: 16 mg/dL (ref 6–23)
CO2: 26 mEq/L (ref 19–32)
Calcium: 9.5 mg/dL (ref 8.4–10.5)
Chloride: 106 mEq/L (ref 96–112)
Creatinine, Ser: 0.67 mg/dL (ref 0.40–1.20)
GFR: 88.17 mL/min (ref >60.00)
Glucose, Bld: 92 mg/dL (ref 70–99)
Potassium: 4 mEq/L (ref 3.5–5.1)
Sodium: 141 mEq/L (ref 135–145)
Total Bilirubin: 0.6 mg/dL (ref 0.2–1.2)
Total Protein: 7.1 g/dL (ref 6.0–8.3)

## 2019-04-30 LAB — HEMOGLOBIN A1C: Hgb A1c MFr Bld: 5.5 % (ref 4.6–6.5)

## 2019-04-30 LAB — C-REACTIVE PROTEIN: CRP: <1 mg/dL (ref 0.5–20.0)

## 2019-04-30 LAB — CBC WITH DIFFERENTIAL/PLATELET
Basophils Absolute: 0.1 10*3/uL (ref 0.0–0.1)
Basophils Relative: 1.1 % (ref 0.0–3.0)
Eosinophils Absolute: 0.1 10*3/uL (ref 0.0–0.7)
Eosinophils Relative: 2.3 % (ref 0.0–5.0)
HCT: 44.2 % (ref 36.0–46.0)
Hemoglobin: 14.9 g/dL (ref 12.0–15.0)
Lymphocytes Relative: 22.7 % (ref 12.0–46.0)
Lymphs Abs: 1.4 10*3/uL (ref 0.7–4.0)
MCHC: 33.8 g/dL (ref 30.0–36.0)
MCV: 87.5 fl (ref 78.0–100.0)
Monocytes Absolute: 0.7 10*3/uL (ref 0.1–1.0)
Monocytes Relative: 11.8 % (ref 3.0–12.0)
Neutro Abs: 3.8 10*3/uL (ref 1.4–7.7)
Neutrophils Relative %: 62.1 % (ref 43.0–77.0)
Platelets: 206 10*3/uL (ref 150.0–400.0)
RBC: 5.05 Mil/uL (ref 3.87–5.11)
RDW: 13.3 % (ref 11.5–15.5)
WBC: 6.1 10*3/uL (ref 4.0–10.5)

## 2019-04-30 LAB — TSH: TSH: 4.98 u[IU]/mL — ABNORMAL HIGH (ref 0.35–4.50)

## 2019-04-30 LAB — LDL CHOLESTEROL, DIRECT: Direct LDL: 141 mg/dL

## 2019-04-30 LAB — SEDIMENTATION RATE: Sed Rate: 11 mm/hr (ref 0–30)

## 2019-05-03 LAB — ANA: Anti Nuclear Antibody (ANA): POSITIVE — AB

## 2019-05-03 LAB — ANTI-NUCLEAR AB-TITER (ANA TITER): ANA Titer 1: 1:80 {titer} — ABNORMAL HIGH

## 2019-05-05 MED ORDER — LEVOTHYROXINE SODIUM 150 MCG PO TABS: 150.0000 ug | ORAL_TABLET | Freq: Every day | ORAL | 1 refills | Status: DC

## 2019-05-05 NOTE — Addendum Note (Signed)
Addended by: Delice Bison E on: 05/05/2019 04:26 PM   Modules accepted: Orders

## 2019-06-01 ENCOUNTER — Other Ambulatory Visit: Payer: Self-pay | Admitting: Obstetrics & Gynecology

## 2019-06-01 DIAGNOSIS — Z01419 Encounter for gynecological examination (general) (routine) without abnormal findings: Secondary | ICD-10-CM | POA: Diagnosis not present

## 2019-06-01 DIAGNOSIS — N644 Mastodynia: Secondary | ICD-10-CM

## 2019-06-01 DIAGNOSIS — Z13820 Encounter for screening for osteoporosis: Secondary | ICD-10-CM | POA: Diagnosis not present

## 2019-06-01 DIAGNOSIS — Z6830 Body mass index (BMI) 30.0-30.9, adult: Secondary | ICD-10-CM | POA: Diagnosis not present

## 2019-06-02 ENCOUNTER — Encounter: Payer: Self-pay | Admitting: Internal Medicine

## 2019-06-02 DIAGNOSIS — E559 Vitamin D deficiency, unspecified: Secondary | ICD-10-CM

## 2019-06-02 DIAGNOSIS — Z1159 Encounter for screening for other viral diseases: Secondary | ICD-10-CM

## 2019-06-02 NOTE — Telephone Encounter (Signed)
Pt aware.

## 2019-06-08 ENCOUNTER — Other Ambulatory Visit (INDEPENDENT_AMBULATORY_CARE_PROVIDER_SITE_OTHER): Payer: 59

## 2019-06-08 DIAGNOSIS — E039 Hypothyroidism, unspecified: Secondary | ICD-10-CM | POA: Diagnosis not present

## 2019-06-08 DIAGNOSIS — E559 Vitamin D deficiency, unspecified: Secondary | ICD-10-CM | POA: Diagnosis not present

## 2019-06-08 DIAGNOSIS — Z1159 Encounter for screening for other viral diseases: Secondary | ICD-10-CM

## 2019-06-08 LAB — VITAMIN D 25 HYDROXY (VIT D DEFICIENCY, FRACTURES): VITD: 34.59 ng/mL (ref 30.00–100.00)

## 2019-06-08 LAB — TSH: TSH: 1.91 u[IU]/mL (ref 0.35–4.50)

## 2019-06-09 ENCOUNTER — Other Ambulatory Visit: Payer: 59

## 2019-06-09 LAB — SAR COV2 SEROLOGY (COVID19)AB(IGG),IA: SARS CoV2 AB IGG: NEGATIVE

## 2019-06-10 ENCOUNTER — Encounter: Payer: Self-pay | Admitting: Internal Medicine

## 2019-06-10 ENCOUNTER — Other Ambulatory Visit: Payer: Self-pay

## 2019-06-10 ENCOUNTER — Telehealth: Payer: Self-pay

## 2019-06-10 DIAGNOSIS — Z20822 Contact with and (suspected) exposure to covid-19: Secondary | ICD-10-CM

## 2019-06-10 DIAGNOSIS — A048 Other specified bacterial intestinal infections: Secondary | ICD-10-CM

## 2019-06-10 DIAGNOSIS — R109 Unspecified abdominal pain: Secondary | ICD-10-CM

## 2019-06-10 DIAGNOSIS — Z20828 Contact with and (suspected) exposure to other viral communicable diseases: Secondary | ICD-10-CM | POA: Diagnosis not present

## 2019-06-10 NOTE — Telephone Encounter (Signed)
Okay thanks. Agree with the plan as you outlined.

## 2019-06-10 NOTE — Telephone Encounter (Signed)
Called patient and ask her to come in for CBC,CMET, Lipase and repeat H Pylori stool test.She said she will come in tomorrow am. She had a 100.9 fever last night, but is 98.8 today and she feels a little better. She didn't want to take the Zofran, said nausea wasn't bad and she doesn't like taking meds if she doesn't have to. She has not been taking the Omeprazole, said she didn't know she was suppose to continue it after the 2 week course of meds to treat her H Pylori. Wants to know if she should start back on the Omprazole after her stool test ? Said she did go get COVID tested today, because her kids wanted her to. I asked her if her pain gets worse or she gets a fever again, to please go to the ED

## 2019-06-11 ENCOUNTER — Other Ambulatory Visit (INDEPENDENT_AMBULATORY_CARE_PROVIDER_SITE_OTHER): Payer: 59

## 2019-06-11 ENCOUNTER — Other Ambulatory Visit: Payer: 59

## 2019-06-11 DIAGNOSIS — R109 Unspecified abdominal pain: Secondary | ICD-10-CM

## 2019-06-11 DIAGNOSIS — A048 Other specified bacterial intestinal infections: Secondary | ICD-10-CM

## 2019-06-11 LAB — COMPREHENSIVE METABOLIC PANEL
ALT: 41 U/L — ABNORMAL HIGH (ref 0–35)
AST: 30 U/L (ref 0–37)
Albumin: 3.9 g/dL (ref 3.5–5.2)
Alkaline Phosphatase: 93 U/L (ref 39–117)
BUN: 15 mg/dL (ref 6–23)
CO2: 25 mEq/L (ref 19–32)
Calcium: 9.4 mg/dL (ref 8.4–10.5)
Chloride: 105 mEq/L (ref 96–112)
Creatinine, Ser: 0.68 mg/dL (ref 0.40–1.20)
GFR: 86.65 mL/min (ref >60.00)
Glucose, Bld: 98 mg/dL (ref 70–99)
Potassium: 3.8 mEq/L (ref 3.5–5.1)
Sodium: 138 mEq/L (ref 135–145)
Total Bilirubin: 0.5 mg/dL (ref 0.2–1.2)
Total Protein: 6.9 g/dL (ref 6.0–8.3)

## 2019-06-11 LAB — CBC WITH DIFFERENTIAL/PLATELET
Basophils Absolute: 0.1 10*3/uL (ref 0.0–0.1)
Basophils Relative: 0.8 % (ref 0.0–3.0)
Eosinophils Absolute: 0.3 10*3/uL (ref 0.0–0.7)
Eosinophils Relative: 4.6 % (ref 0.0–5.0)
HCT: 42.2 % (ref 36.0–46.0)
Hemoglobin: 14.4 g/dL (ref 12.0–15.0)
Lymphocytes Relative: 17.8 % (ref 12.0–46.0)
Lymphs Abs: 1.2 10*3/uL (ref 0.7–4.0)
MCHC: 34.2 g/dL (ref 30.0–36.0)
MCV: 86.8 fl (ref 78.0–100.0)
Monocytes Absolute: 1 10*3/uL (ref 0.1–1.0)
Monocytes Relative: 14.6 % — ABNORMAL HIGH (ref 3.0–12.0)
Neutro Abs: 4.2 10*3/uL (ref 1.4–7.7)
Neutrophils Relative %: 62.2 % (ref 43.0–77.0)
Platelets: 210 10*3/uL (ref 150.0–400.0)
RBC: 4.86 Mil/uL (ref 3.87–5.11)
RDW: 13 % (ref 11.5–15.5)
WBC: 6.7 10*3/uL (ref 4.0–10.5)

## 2019-06-11 LAB — LIPASE: Lipase: 23 U/L (ref 11.0–59.0)

## 2019-06-11 LAB — NOVEL CORONAVIRUS, NAA: SARS-CoV-2, NAA: NOT DETECTED

## 2019-06-11 NOTE — Telephone Encounter (Signed)
Called patient back and let her know part of her labs are still pending and part of them need to be reviewed by the Dr. And then we will call her with the results.

## 2019-06-11 NOTE — Telephone Encounter (Signed)
Pt reported that she had labs and stool test done this morning.  Pt requested an update on any results.

## 2019-06-14 ENCOUNTER — Other Ambulatory Visit: Payer: Self-pay | Admitting: Obstetrics & Gynecology

## 2019-06-14 ENCOUNTER — Other Ambulatory Visit: Payer: Self-pay

## 2019-06-14 ENCOUNTER — Ambulatory Visit
Admission: RE | Admit: 2019-06-14 | Discharge: 2019-06-14 | Disposition: A | Discharge status: Home / Self Care | Payer: 59 | Source: Ambulatory Visit | Attending: Obstetrics & Gynecology | Admitting: Obstetrics & Gynecology

## 2019-06-14 DIAGNOSIS — N644 Mastodynia: Secondary | ICD-10-CM

## 2019-06-14 DIAGNOSIS — R922 Inconclusive mammogram: Secondary | ICD-10-CM | POA: Diagnosis not present

## 2019-06-14 LAB — HELICOBACTER PYLORI  SPECIAL ANTIGEN
MICRO NUMBER:: 973797
SPECIMEN QUALITY: ADEQUATE

## 2019-06-14 MED ORDER — ONDANSETRON HCL 4 MG PO TABS: 4.0000 mg | ORAL_TABLET | Freq: Four times a day (QID) | ORAL | 0 refills | Status: AC | PRN

## 2019-06-14 MED FILL — ONDANSETRON HCL 4 MG TABLET: 4 | 7 days supply | Qty: 30 | Fill #0

## 2019-06-14 MED FILL — OMEPRAZOLE 20 MG CAPSULE DR: 20 | 90 days supply | Qty: 180 | Fill #0

## 2019-06-14 MED FILL — MOMETASONE FUROATE 50 MCG S: 50 | 30 days supply | Qty: 17 | Fill #3

## 2019-07-06 ENCOUNTER — Encounter: Payer: Self-pay | Admitting: Internal Medicine

## 2019-07-06 ENCOUNTER — Other Ambulatory Visit: Payer: Self-pay

## 2019-07-06 MED ORDER — LEVOTHYROXINE SODIUM 150 MCG PO TABS: 150.0000 ug | ORAL_TABLET | Freq: Every day | ORAL | 1 refills | Status: DC

## 2019-07-20 ENCOUNTER — Ambulatory Visit: Payer: 59 | Admitting: Gastroenterology

## 2019-09-01 ENCOUNTER — Other Ambulatory Visit: Payer: Self-pay | Admitting: Internal Medicine

## 2019-09-28 ENCOUNTER — Encounter: Payer: Self-pay | Admitting: Internal Medicine

## 2019-10-11 ENCOUNTER — Encounter: Payer: Self-pay | Admitting: Internal Medicine

## 2019-10-12 ENCOUNTER — Other Ambulatory Visit: Payer: Self-pay

## 2019-10-12 MED ORDER — LEVOTHYROXINE SODIUM 150 MCG PO TABS: ORAL_TABLET | ORAL | 1 refills | Status: DC

## 2019-10-31 DIAGNOSIS — R791 Abnormal coagulation profile: Secondary | ICD-10-CM | POA: Diagnosis not present

## 2019-10-31 DIAGNOSIS — R091 Pleurisy: Secondary | ICD-10-CM | POA: Diagnosis not present

## 2019-10-31 DIAGNOSIS — R0781 Pleurodynia: Secondary | ICD-10-CM | POA: Diagnosis not present

## 2019-10-31 DIAGNOSIS — I10 Essential (primary) hypertension: Secondary | ICD-10-CM | POA: Diagnosis not present

## 2019-10-31 DIAGNOSIS — R079 Chest pain, unspecified: Secondary | ICD-10-CM | POA: Diagnosis not present

## 2019-11-19 ENCOUNTER — Other Ambulatory Visit: Payer: Self-pay

## 2019-11-19 MED ORDER — MOMETASONE FUROATE 50 MCG/ACT NA SUSP: NASAL | 5 refills | Status: AC

## 2019-11-19 MED FILL — MOMETASONE FUROATE 50 MCG S: 50 | 30 days supply | Qty: 17 | Fill #0

## 2020-02-06 IMAGING — MG DIGITAL DIAGNOSTIC BILAT W/ TOMO W/ CAD
6 of 12 series · 6 of 36 positions shown · non-contrast
Comparison: Previous exam(s).

CLINICAL DATA: Patient presents with bilateral breast pain for
approximately 1 month when the breasts are pressed on.

EXAM:
DIGITAL DIAGNOSTIC BILATERAL MAMMOGRAM WITH CAD AND TOMO
ULTRASOUND BILATERAL BREAST

[L TAN synth-2D]
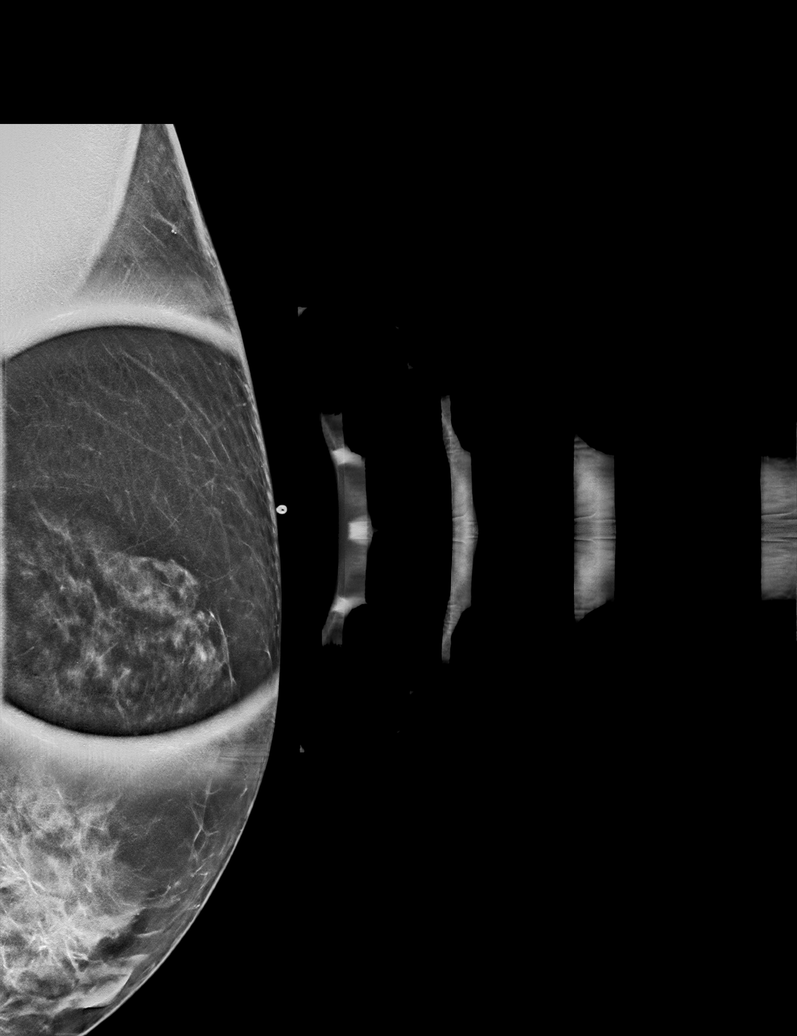

[R CC synth-2D]
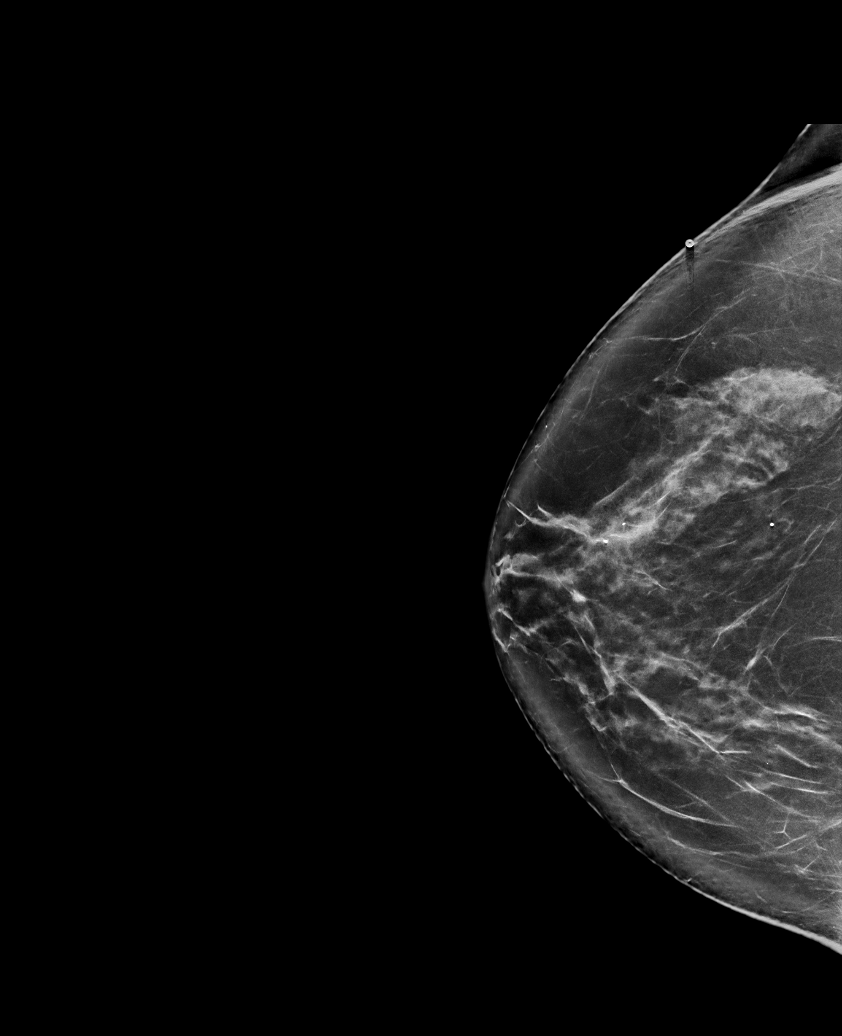

[R TAN synth-2D]
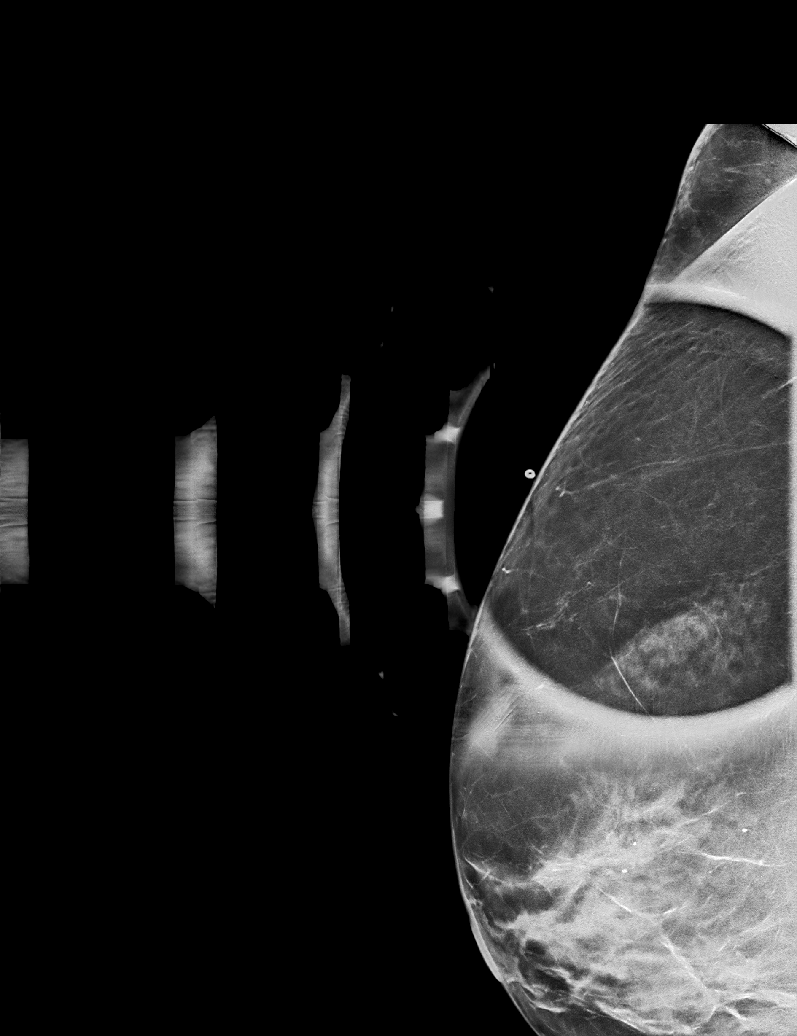

[R MLO synth-2D]
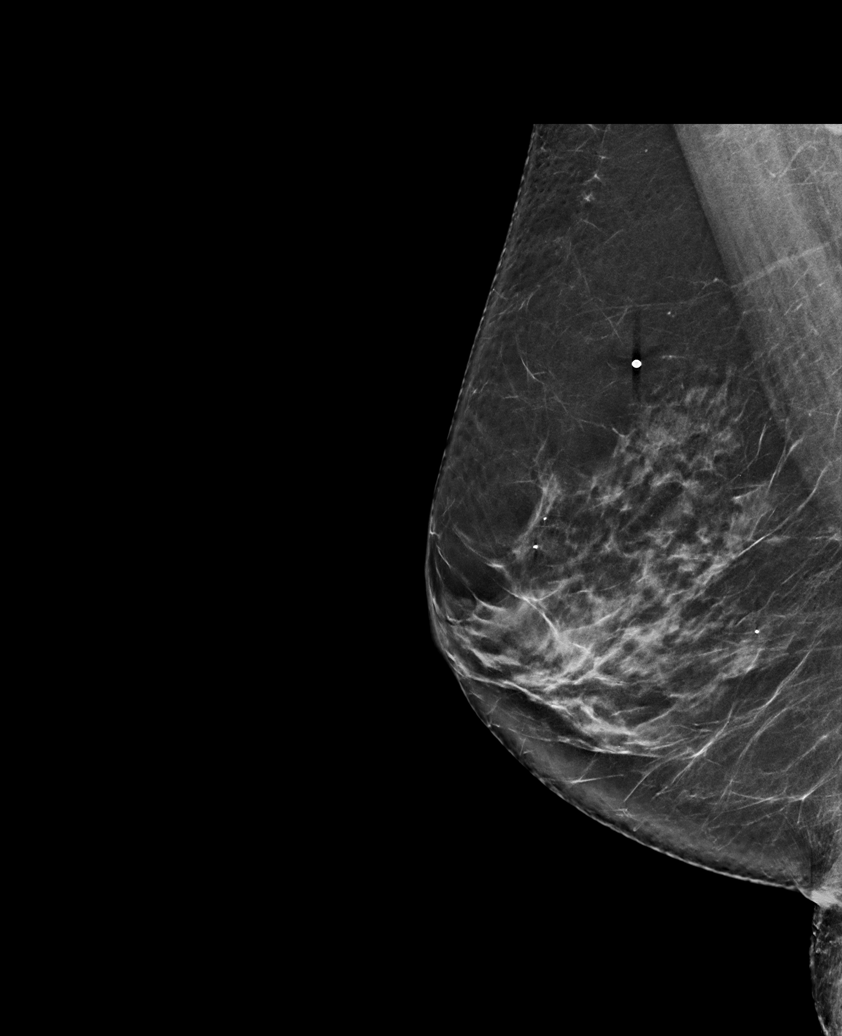

[L CC synth-2D]
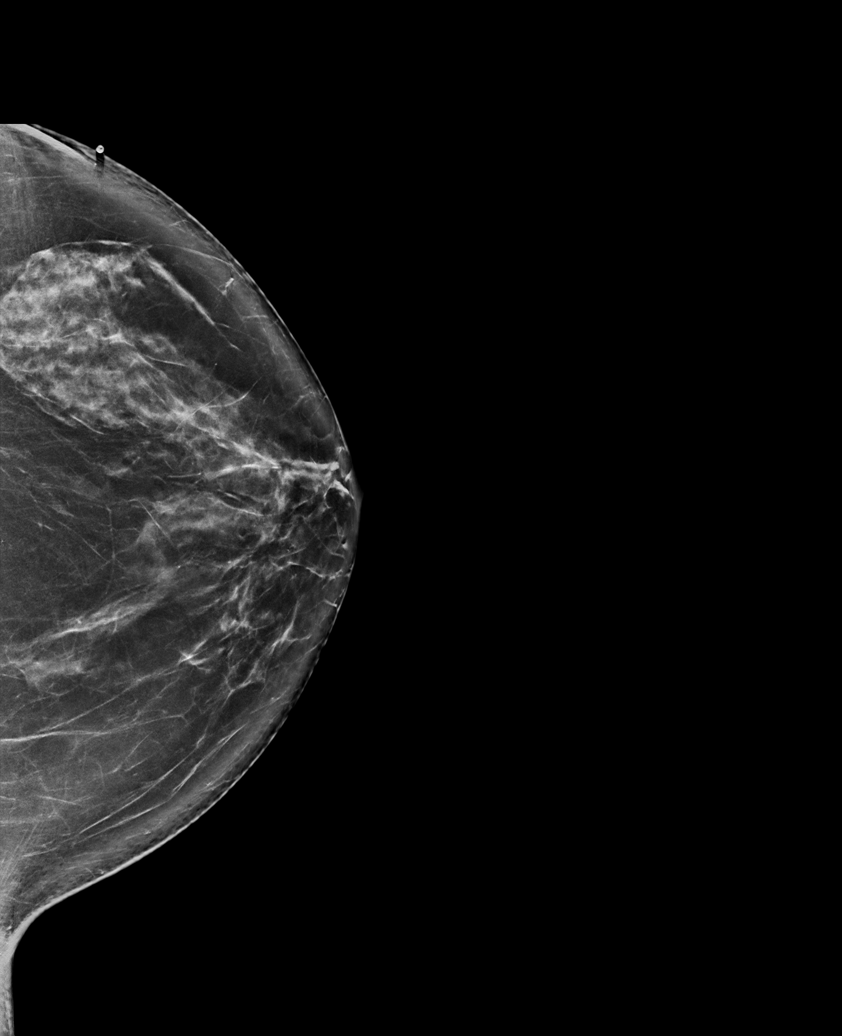

[L MLO synth-2D]
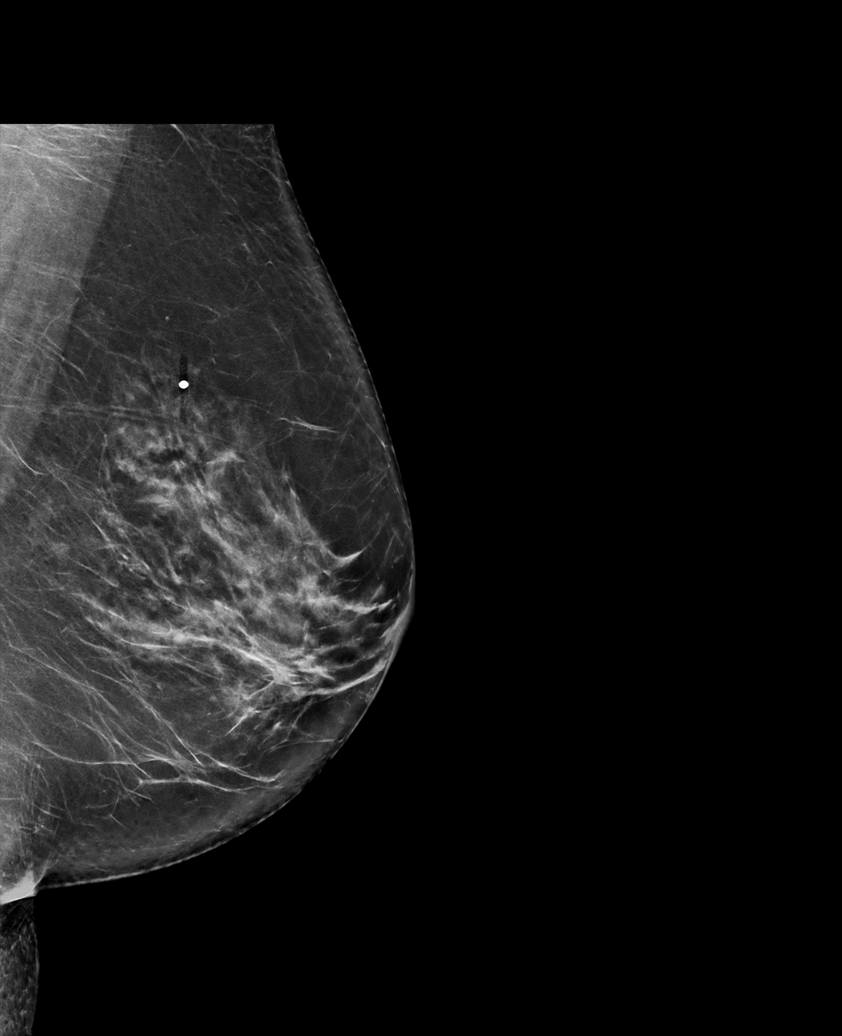

[6 of 36 positions shown; findings below may reference images not displayed]

ACR Breast Density Category c: The breast tissue is heterogeneously
dense, which may obscure small masses.
FINDINGS: Mammogram: Additional spot compression tangential views were
performed at the sites of pain in the upper outer quadrants of the
bilateral breast demonstrating no discrete cystic or solid
abnormality. No suspicious mass, distortion, or microcalcifications
are identified to suggest presence of malignancy.

Mammographic images were processed with CAD.

On physical exam I palpate normal fibroglandular tissue at the sites
of pain, no fixed mass.

Targeted ultrasound is performed at the site of pain in the right
upper outer breast at 9-10 o'clock demonstrating no discrete cystic
or solid abnormality. Targeted ultrasound is performed at the site
of pain of the left upper outer breast at 2-3 o'clock demonstrating
no discrete cystic or solid abnormality.
IMPRESSION: No mammographic or sonographic finding to explain the patient's pain
in the bilateral breasts. No evidence of malignancy.

RECOMMENDATION:
Screening mammogram in one year.(Code:IL-C-79P)

We discussed some of the issues regarding breast pain, including
limiting caffeine, wearing adequate support, over-the-counter pain
medication, low-fat diet, exercise, and ice as needed.

BI-RADS CATEGORY  1: Negative.

## 2020-02-06 IMAGING — US US BREAST*R* LIMITED INC AXILLA
1 series · 4 of 4 positions shown · non-contrast
Comparison: Previous exam(s).

CLINICAL DATA: Patient presents with bilateral breast pain for
approximately 1 month when the breasts are pressed on.

EXAM:
DIGITAL DIAGNOSTIC BILATERAL MAMMOGRAM WITH CAD AND TOMO
ULTRASOUND BILATERAL BREAST

[Series 1: us breast*right* limited inc axilla · 0.07mm/px · 4 of 4 slices shown]
[im 1/4]
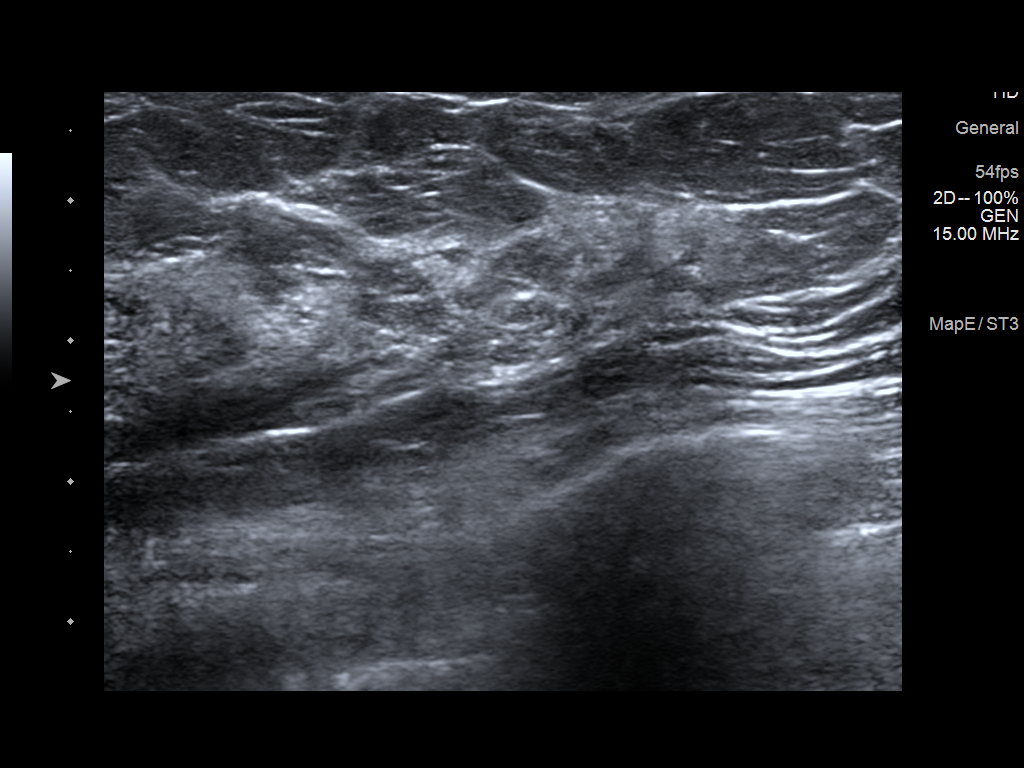
[im 2/4]
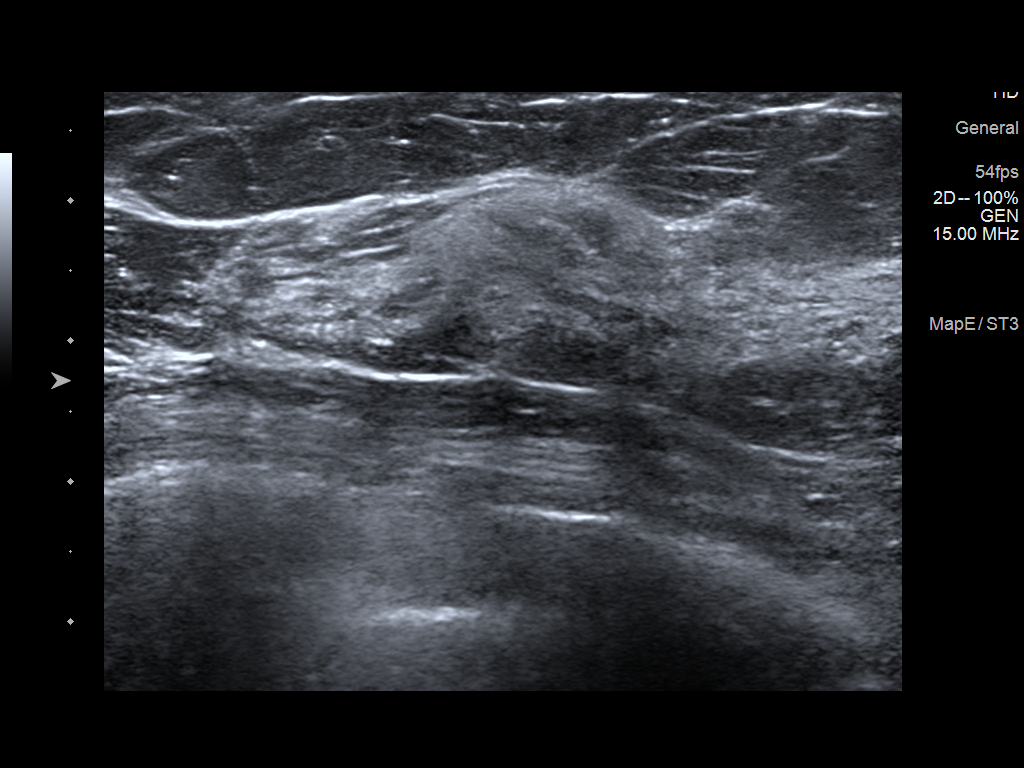
[im 3/4]
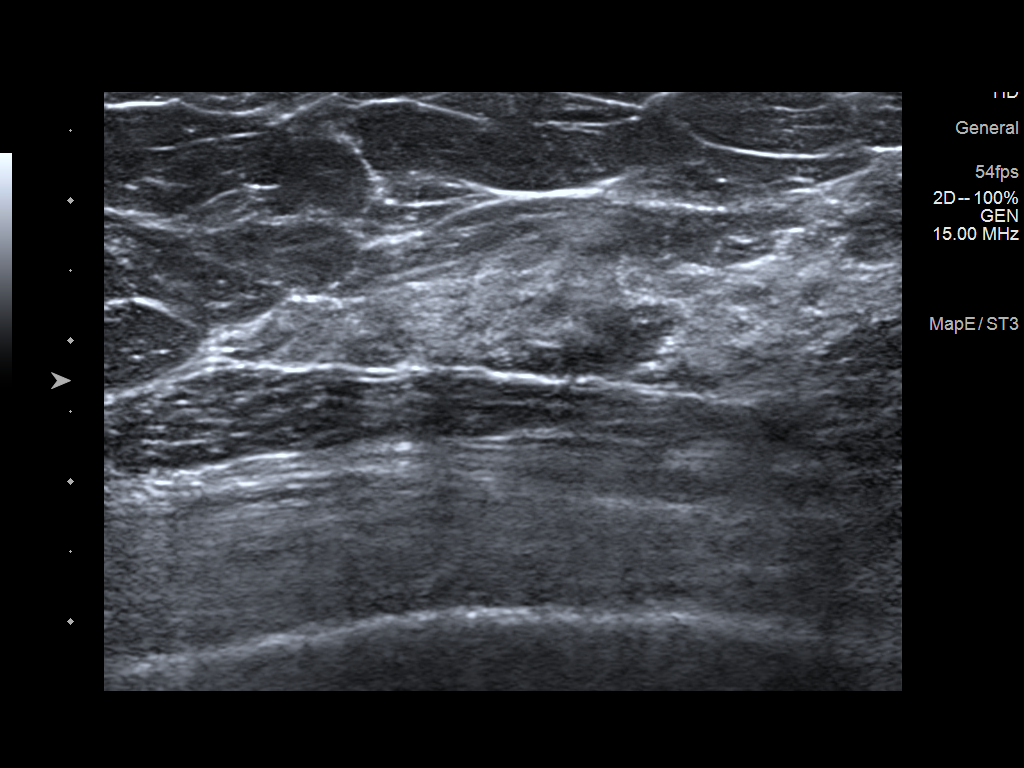
[im 4/4]
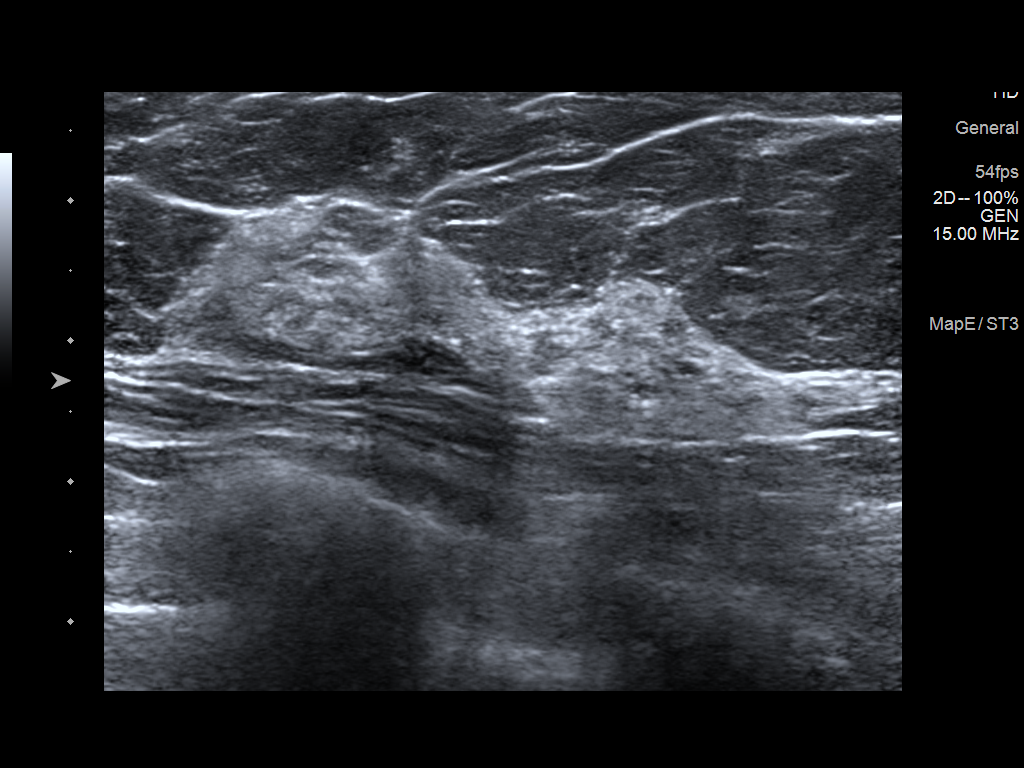

[4 of 4 positions shown; findings below may reference images not displayed]

ACR Breast Density Category c: The breast tissue is heterogeneously
dense, which may obscure small masses.
FINDINGS: Mammogram: Additional spot compression tangential views were
performed at the sites of pain in the upper outer quadrants of the
bilateral breast demonstrating no discrete cystic or solid
abnormality. No suspicious mass, distortion, or microcalcifications
are identified to suggest presence of malignancy.

Mammographic images were processed with CAD.

On physical exam I palpate normal fibroglandular tissue at the sites
of pain, no fixed mass.

Targeted ultrasound is performed at the site of pain in the right
upper outer breast at 9-10 o'clock demonstrating no discrete cystic
or solid abnormality. Targeted ultrasound is performed at the site
of pain of the left upper outer breast at 2-3 o'clock demonstrating
no discrete cystic or solid abnormality.
IMPRESSION: No mammographic or sonographic finding to explain the patient's pain
in the bilateral breasts. No evidence of malignancy.

RECOMMENDATION:
Screening mammogram in one year.(Code:IL-C-79P)

We discussed some of the issues regarding breast pain, including
limiting caffeine, wearing adequate support, over-the-counter pain
medication, low-fat diet, exercise, and ice as needed.

BI-RADS CATEGORY  1: Negative.

## 2020-04-03 MED FILL — MOMETASONE FUROATE 50 MCG S: 50 | 30 days supply | Qty: 17 | Fill #2

## 2020-05-02 ENCOUNTER — Other Ambulatory Visit: Payer: Self-pay | Admitting: Internal Medicine

## 2020-05-02 ENCOUNTER — Encounter: Payer: Self-pay | Admitting: Internal Medicine

## 2020-05-30 DIAGNOSIS — E785 Hyperlipidemia, unspecified: Secondary | ICD-10-CM | POA: Diagnosis not present

## 2020-05-30 DIAGNOSIS — Z1239 Encounter for other screening for malignant neoplasm of breast: Secondary | ICD-10-CM | POA: Diagnosis not present

## 2020-05-30 DIAGNOSIS — E559 Vitamin D deficiency, unspecified: Secondary | ICD-10-CM | POA: Diagnosis not present

## 2020-05-30 DIAGNOSIS — E039 Hypothyroidism, unspecified: Secondary | ICD-10-CM | POA: Diagnosis not present

## 2020-06-01 DIAGNOSIS — E559 Vitamin D deficiency, unspecified: Secondary | ICD-10-CM | POA: Diagnosis not present

## 2020-06-01 DIAGNOSIS — E785 Hyperlipidemia, unspecified: Secondary | ICD-10-CM | POA: Diagnosis not present

## 2020-06-01 DIAGNOSIS — E039 Hypothyroidism, unspecified: Secondary | ICD-10-CM | POA: Diagnosis not present

## 2020-06-05 ENCOUNTER — Other Ambulatory Visit: Payer: Self-pay | Admitting: Obstetrics & Gynecology

## 2020-06-05 DIAGNOSIS — Z1231 Encounter for screening mammogram for malignant neoplasm of breast: Secondary | ICD-10-CM

## 2020-06-23 ENCOUNTER — Other Ambulatory Visit: Payer: Self-pay

## 2020-06-23 ENCOUNTER — Ambulatory Visit
Admission: RE | Admit: 2020-06-23 | Discharge: 2020-06-23 | Disposition: A | Discharge status: Home / Self Care | Payer: 59 | Source: Ambulatory Visit | Attending: Obstetrics & Gynecology | Admitting: Obstetrics & Gynecology

## 2020-06-23 DIAGNOSIS — Z1231 Encounter for screening mammogram for malignant neoplasm of breast: Secondary | ICD-10-CM

## 2020-06-23 DIAGNOSIS — Z130 Encounter for screening for diseases of the blood and blood-forming organs and certain disorders involving the immune mechanism: Secondary | ICD-10-CM | POA: Diagnosis not present

## 2020-06-23 DIAGNOSIS — Z01419 Encounter for gynecological examination (general) (routine) without abnormal findings: Secondary | ICD-10-CM | POA: Diagnosis not present

## 2020-06-23 DIAGNOSIS — Z6825 Body mass index (BMI) 25.0-25.9, adult: Secondary | ICD-10-CM | POA: Diagnosis not present

## 2020-06-29 DIAGNOSIS — R5383 Other fatigue: Secondary | ICD-10-CM | POA: Diagnosis not present

## 2020-06-29 DIAGNOSIS — G4700 Insomnia, unspecified: Secondary | ICD-10-CM | POA: Diagnosis not present

## 2020-06-29 DIAGNOSIS — R0683 Snoring: Secondary | ICD-10-CM | POA: Diagnosis not present

## 2020-07-14 DIAGNOSIS — D2261 Melanocytic nevi of right upper limb, including shoulder: Secondary | ICD-10-CM | POA: Diagnosis not present

## 2020-07-14 DIAGNOSIS — L659 Nonscarring hair loss, unspecified: Secondary | ICD-10-CM | POA: Diagnosis not present

## 2020-07-14 DIAGNOSIS — Z7189 Other specified counseling: Secondary | ICD-10-CM | POA: Diagnosis not present

## 2020-07-14 DIAGNOSIS — L718 Other rosacea: Secondary | ICD-10-CM | POA: Diagnosis not present

## 2020-07-14 DIAGNOSIS — D225 Melanocytic nevi of trunk: Secondary | ICD-10-CM | POA: Diagnosis not present

## 2020-07-14 DIAGNOSIS — D485 Neoplasm of uncertain behavior of skin: Secondary | ICD-10-CM | POA: Diagnosis not present

## 2020-07-14 DIAGNOSIS — L821 Other seborrheic keratosis: Secondary | ICD-10-CM | POA: Diagnosis not present

## 2020-07-14 DIAGNOSIS — L578 Other skin changes due to chronic exposure to nonionizing radiation: Secondary | ICD-10-CM | POA: Diagnosis not present

## 2020-07-17 MED FILL — MOMETASONE FUROATE 50 MCG S: 50 | 30 days supply | Qty: 17 | Fill #0

## 2020-09-26 DIAGNOSIS — L659 Nonscarring hair loss, unspecified: Secondary | ICD-10-CM | POA: Diagnosis not present

## 2021-06-28 ENCOUNTER — Other Ambulatory Visit: Payer: Self-pay | Admitting: Obstetrics & Gynecology

## 2021-06-28 ENCOUNTER — Other Ambulatory Visit: Payer: Self-pay | Admitting: Internal Medicine

## 2021-06-28 DIAGNOSIS — Z1231 Encounter for screening mammogram for malignant neoplasm of breast: Secondary | ICD-10-CM

## 2021-07-04 ENCOUNTER — Other Ambulatory Visit: Payer: Self-pay | Admitting: Internal Medicine

## 2021-07-04 DIAGNOSIS — N644 Mastodynia: Secondary | ICD-10-CM

## 2021-08-10 ENCOUNTER — Ambulatory Visit
Admission: RE | Admit: 2021-08-10 | Discharge: 2021-08-10 | Disposition: A | Discharge status: Home / Self Care | Payer: Medicare Other | Source: Ambulatory Visit | Attending: *Deleted | Admitting: *Deleted

## 2021-08-10 ENCOUNTER — Ambulatory Visit: Payer: 59

## 2021-08-10 DIAGNOSIS — N644 Mastodynia: Secondary | ICD-10-CM

## 2024-03-07 LAB — COLOGUARD: COLOGUARD: NEGATIVE

## 2024-03-24 ENCOUNTER — Institutional Professional Consult (permissible substitution): Admitting: Cardiology
